# Patient Record
Sex: Male | Born: 1960 | Race: Black or African American | Hispanic: No | Marital: Married | Smoking: Former smoker
Health system: Southern US, Community
[De-identification: ages and names within clinical notes are randomized; demographics above are authoritative.]

## PROBLEM LIST (undated history)

## (undated) DIAGNOSIS — I10 Essential (primary) hypertension: Secondary | ICD-10-CM

## (undated) DIAGNOSIS — E785 Hyperlipidemia, unspecified: Secondary | ICD-10-CM

## (undated) DIAGNOSIS — K219 Gastro-esophageal reflux disease without esophagitis: Secondary | ICD-10-CM

## (undated) DIAGNOSIS — E119 Type 2 diabetes mellitus without complications: Secondary | ICD-10-CM

## (undated) DIAGNOSIS — K5790 Diverticulosis of intestine, part unspecified, without perforation or abscess without bleeding: Secondary | ICD-10-CM

## (undated) DIAGNOSIS — E1169 Type 2 diabetes mellitus with other specified complication: Secondary | ICD-10-CM

## (undated) DIAGNOSIS — K922 Gastrointestinal hemorrhage, unspecified: Secondary | ICD-10-CM

## (undated) DIAGNOSIS — Z87442 Personal history of urinary calculi: Secondary | ICD-10-CM

## (undated) DIAGNOSIS — I219 Acute myocardial infarction, unspecified: Secondary | ICD-10-CM

## (undated) DIAGNOSIS — K649 Unspecified hemorrhoids: Secondary | ICD-10-CM

## (undated) DIAGNOSIS — E7800 Pure hypercholesterolemia, unspecified: Secondary | ICD-10-CM

## (undated) DIAGNOSIS — F4310 Post-traumatic stress disorder, unspecified: Secondary | ICD-10-CM

## (undated) DIAGNOSIS — I2510 Atherosclerotic heart disease of native coronary artery without angina pectoris: Secondary | ICD-10-CM

## (undated) DIAGNOSIS — R011 Cardiac murmur, unspecified: Secondary | ICD-10-CM

## (undated) DIAGNOSIS — Z9289 Personal history of other medical treatment: Secondary | ICD-10-CM

## (undated) HISTORY — PX: COLON SURGERY: SHX602

## (undated) HISTORY — PX: JOINT REPLACEMENT: SHX530

## (undated) HISTORY — PX: INGUINAL HERNIA REPAIR: SUR1180

---

## 2009-04-02 DIAGNOSIS — I219 Acute myocardial infarction, unspecified: Secondary | ICD-10-CM

## 2009-04-02 HISTORY — PX: CORONARY ANGIOPLASTY WITH STENT PLACEMENT: SHX49

## 2009-04-02 HISTORY — DX: Acute myocardial infarction, unspecified: I21.9

## 2009-04-02 HISTORY — PX: COLON RESECTION: SHX5231

## 2009-07-22 ENCOUNTER — Ambulatory Visit: Payer: Self-pay | Admitting: Diagnostic Radiology

## 2009-07-22 ENCOUNTER — Emergency Department (HOSPITAL_BASED_OUTPATIENT_CLINIC_OR_DEPARTMENT_OTHER): Admission: EM | Admit: 2009-07-22 | Discharge: 2009-07-22 | Payer: Self-pay | Admitting: Pediatrics

## 2010-03-02 ENCOUNTER — Emergency Department (HOSPITAL_BASED_OUTPATIENT_CLINIC_OR_DEPARTMENT_OTHER): Admission: EM | Admit: 2010-03-02 | Discharge: 2010-03-02 | Payer: Self-pay | Admitting: Emergency Medicine

## 2010-03-06 ENCOUNTER — Emergency Department (HOSPITAL_BASED_OUTPATIENT_CLINIC_OR_DEPARTMENT_OTHER)
Admission: EM | Admit: 2010-03-06 | Discharge: 2010-03-06 | Payer: Self-pay | Source: Home / Self Care | Admitting: Emergency Medicine

## 2010-06-13 LAB — BASIC METABOLIC PANEL
CO2: 25 mEq/L (ref 19–32)
Chloride: 103 mEq/L (ref 96–112)
GFR calc Af Amer: >60 mL/min (ref >60)
Potassium: 4.6 mEq/L (ref 3.5–5.1)

## 2010-06-13 LAB — GLUCOSE, CAPILLARY

## 2010-06-20 LAB — URINALYSIS, ROUTINE W REFLEX MICROSCOPIC
Glucose, UA: NEGATIVE mg/dL
Leukocytes, UA: NEGATIVE
pH: 6.5 (ref 5.0–8.0)

## 2010-06-20 LAB — DIFFERENTIAL
Basophils Absolute: 0.4 10*3/uL — ABNORMAL HIGH (ref 0.0–0.1)
Eosinophils Relative: 1 % (ref 0–5)
Lymphocytes Relative: 19 % (ref 12–46)
Monocytes Absolute: 1 10*3/uL (ref 0.1–1.0)

## 2010-06-20 LAB — CBC
Platelets: 244 10*3/uL (ref 150–400)
WBC: 11.3 10*3/uL — ABNORMAL HIGH (ref 4.0–10.5)

## 2010-06-20 LAB — COMPREHENSIVE METABOLIC PANEL
AST: 18 U/L (ref 0–37)
Albumin: 4 g/dL (ref 3.5–5.2)
Alkaline Phosphatase: 75 U/L (ref 39–117)
Chloride: 104 mEq/L (ref 96–112)
Creatinine, Ser: 1 mg/dL (ref 0.4–1.5)
GFR calc Af Amer: >60 mL/min (ref >60)
Potassium: 4.1 mEq/L (ref 3.5–5.1)
Sodium: 142 mEq/L (ref 135–145)
Total Bilirubin: 0.4 mg/dL (ref 0.3–1.2)

## 2010-06-20 LAB — URINE MICROSCOPIC-ADD ON

## 2010-09-23 ENCOUNTER — Emergency Department (HOSPITAL_BASED_OUTPATIENT_CLINIC_OR_DEPARTMENT_OTHER)
Admission: EM | Admit: 2010-09-23 | Discharge: 2010-09-23 | Disposition: A | Discharge status: Other Acute Inpatient Hospital | Payer: Non-veteran care | Attending: Emergency Medicine | Admitting: Emergency Medicine

## 2010-09-23 ENCOUNTER — Emergency Department (INDEPENDENT_AMBULATORY_CARE_PROVIDER_SITE_OTHER): Payer: Non-veteran care

## 2010-09-23 DIAGNOSIS — I10 Essential (primary) hypertension: Secondary | ICD-10-CM | POA: Insufficient documentation

## 2010-09-23 DIAGNOSIS — E119 Type 2 diabetes mellitus without complications: Secondary | ICD-10-CM | POA: Insufficient documentation

## 2010-09-23 DIAGNOSIS — Z79899 Other long term (current) drug therapy: Secondary | ICD-10-CM | POA: Insufficient documentation

## 2010-09-23 DIAGNOSIS — R079 Chest pain, unspecified: Secondary | ICD-10-CM

## 2010-09-23 LAB — CBC
HCT: 37.2 % — ABNORMAL LOW (ref 39.0–52.0)
MCHC: 34.4 g/dL (ref 30.0–36.0)
RDW: 13.9 % (ref 11.5–15.5)

## 2010-09-23 LAB — COMPREHENSIVE METABOLIC PANEL
ALT: 18 U/L (ref 0–53)
Alkaline Phosphatase: 43 U/L (ref 39–117)
CO2: 27 mEq/L (ref 19–32)
Chloride: 96 mEq/L (ref 96–112)
GFR calc Af Amer: >60 mL/min (ref >60)
GFR calc non Af Amer: >60 mL/min (ref >60)
Glucose, Bld: 231 mg/dL — ABNORMAL HIGH (ref 70–99)
Potassium: 3.3 mEq/L — ABNORMAL LOW (ref 3.5–5.1)
Sodium: 135 mEq/L (ref 135–145)

## 2010-09-23 LAB — URINALYSIS, ROUTINE W REFLEX MICROSCOPIC
Bilirubin Urine: NEGATIVE
Hgb urine dipstick: NEGATIVE
Ketones, ur: NEGATIVE mg/dL
Protein, ur: NEGATIVE mg/dL
Urobilinogen, UA: 1 mg/dL (ref 0.0–1.0)

## 2010-09-23 LAB — CK TOTAL AND CKMB (NOT AT ARMC): Total CK: 317 U/L — ABNORMAL HIGH (ref 7–232)

## 2012-10-15 ENCOUNTER — Encounter (HOSPITAL_COMMUNITY): Payer: Self-pay | Admitting: Family Medicine

## 2012-10-15 ENCOUNTER — Inpatient Hospital Stay (HOSPITAL_COMMUNITY)
Admission: EM | Admit: 2012-10-15 | Discharge: 2012-10-30 | DRG: 357 | Disposition: A | Discharge status: Home Health | Payer: Non-veteran care | Attending: Internal Medicine | Admitting: Internal Medicine

## 2012-10-15 DIAGNOSIS — Z794 Long term (current) use of insulin: Secondary | ICD-10-CM

## 2012-10-15 DIAGNOSIS — K5731 Diverticulosis of large intestine without perforation or abscess with bleeding: Principal | ICD-10-CM | POA: Diagnosis present

## 2012-10-15 DIAGNOSIS — T50995A Adverse effect of other drugs, medicaments and biological substances, initial encounter: Secondary | ICD-10-CM | POA: Diagnosis present

## 2012-10-15 DIAGNOSIS — K5790 Diverticulosis of intestine, part unspecified, without perforation or abscess without bleeding: Secondary | ICD-10-CM | POA: Diagnosis present

## 2012-10-15 DIAGNOSIS — I10 Essential (primary) hypertension: Secondary | ICD-10-CM | POA: Diagnosis present

## 2012-10-15 DIAGNOSIS — Z9861 Coronary angioplasty status: Secondary | ICD-10-CM

## 2012-10-15 DIAGNOSIS — Z6832 Body mass index (BMI) 32.0-32.9, adult: Secondary | ICD-10-CM

## 2012-10-15 DIAGNOSIS — N4889 Other specified disorders of penis: Secondary | ICD-10-CM | POA: Diagnosis not present

## 2012-10-15 DIAGNOSIS — E669 Obesity, unspecified: Secondary | ICD-10-CM | POA: Diagnosis present

## 2012-10-15 DIAGNOSIS — D649 Anemia, unspecified: Secondary | ICD-10-CM

## 2012-10-15 DIAGNOSIS — I9589 Other hypotension: Secondary | ICD-10-CM

## 2012-10-15 DIAGNOSIS — I2510 Atherosclerotic heart disease of native coronary artery without angina pectoris: Secondary | ICD-10-CM | POA: Diagnosis present

## 2012-10-15 DIAGNOSIS — L02219 Cutaneous abscess of trunk, unspecified: Secondary | ICD-10-CM | POA: Diagnosis not present

## 2012-10-15 DIAGNOSIS — L02215 Cutaneous abscess of perineum: Secondary | ICD-10-CM | POA: Diagnosis present

## 2012-10-15 DIAGNOSIS — K922 Gastrointestinal hemorrhage, unspecified: Secondary | ICD-10-CM | POA: Diagnosis present

## 2012-10-15 DIAGNOSIS — R000 Tachycardia, unspecified: Secondary | ICD-10-CM | POA: Diagnosis not present

## 2012-10-15 DIAGNOSIS — I959 Hypotension, unspecified: Secondary | ICD-10-CM | POA: Diagnosis present

## 2012-10-15 DIAGNOSIS — D72829 Elevated white blood cell count, unspecified: Secondary | ICD-10-CM | POA: Diagnosis not present

## 2012-10-15 DIAGNOSIS — K219 Gastro-esophageal reflux disease without esophagitis: Secondary | ICD-10-CM | POA: Diagnosis present

## 2012-10-15 DIAGNOSIS — L03319 Cellulitis of trunk, unspecified: Secondary | ICD-10-CM | POA: Diagnosis not present

## 2012-10-15 DIAGNOSIS — Z9049 Acquired absence of other specified parts of digestive tract: Secondary | ICD-10-CM

## 2012-10-15 DIAGNOSIS — D62 Acute posthemorrhagic anemia: Secondary | ICD-10-CM | POA: Diagnosis present

## 2012-10-15 DIAGNOSIS — K644 Residual hemorrhoidal skin tags: Secondary | ICD-10-CM | POA: Diagnosis present

## 2012-10-15 DIAGNOSIS — E876 Hypokalemia: Secondary | ICD-10-CM | POA: Diagnosis present

## 2012-10-15 DIAGNOSIS — E119 Type 2 diabetes mellitus without complications: Secondary | ICD-10-CM | POA: Diagnosis present

## 2012-10-15 DIAGNOSIS — Z79899 Other long term (current) drug therapy: Secondary | ICD-10-CM

## 2012-10-15 DIAGNOSIS — Z7982 Long term (current) use of aspirin: Secondary | ICD-10-CM

## 2012-10-15 HISTORY — DX: Unspecified hemorrhoids: K64.9

## 2012-10-15 HISTORY — DX: Essential (primary) hypertension: I10

## 2012-10-15 HISTORY — DX: Gastro-esophageal reflux disease without esophagitis: K21.9

## 2012-10-15 HISTORY — DX: Cardiac murmur, unspecified: R01.1

## 2012-10-15 HISTORY — DX: Diverticulosis of intestine, part unspecified, without perforation or abscess without bleeding: K57.90

## 2012-10-15 HISTORY — DX: Atherosclerotic heart disease of native coronary artery without angina pectoris: I25.10

## 2012-10-15 LAB — COMPREHENSIVE METABOLIC PANEL
ALT: 12 U/L (ref 0–53)
Albumin: 3 g/dL — ABNORMAL LOW (ref 3.5–5.2)
Alkaline Phosphatase: 34 U/L — ABNORMAL LOW (ref 39–117)
BUN: 13 mg/dL (ref 6–23)
Calcium: 8.2 mg/dL — ABNORMAL LOW (ref 8.4–10.5)
Potassium: 3.1 mEq/L — ABNORMAL LOW (ref 3.5–5.1)
Sodium: 139 mEq/L (ref 135–145)
Total Protein: 6.2 g/dL (ref 6.0–8.3)

## 2012-10-15 LAB — CBC
MCH: 29.2 pg (ref 26.0–34.0)
MCHC: 34.1 g/dL (ref 30.0–36.0)
RDW: 14.7 % (ref 11.5–15.5)

## 2012-10-15 LAB — PROTIME-INR: INR: 1.02 (ref 0.00–1.49)

## 2012-10-15 LAB — OCCULT BLOOD, POC DEVICE: Fecal Occult Bld: POSITIVE — AB

## 2012-10-15 MED ORDER — SODIUM CHLORIDE 0.9 % IV SOLN
250.0000 mL | INTRAVENOUS | Status: DC | PRN
  Administered 2012-10-18: 250 mL via INTRAVENOUS

## 2012-10-15 MED ORDER — SODIUM CHLORIDE 0.9 % IV SOLN
INTRAVENOUS | Status: DC
Start: 2012-10-15 — End: 2012-10-17
  Administered 2012-10-16: 50 mL/h via INTRAVENOUS
  Administered 2012-10-16: via INTRAVENOUS

## 2012-10-15 MED ORDER — POTASSIUM CHLORIDE 10 MEQ/100ML IV SOLN
10.0000 meq | INTRAVENOUS | Status: DC
  Administered 2012-10-16: 10 meq via INTRAVENOUS
  Filled 2012-10-15: qty 400

## 2012-10-15 MED ORDER — PANTOPRAZOLE SODIUM 40 MG IV SOLR
40.0000 mg | Freq: Every day | INTRAVENOUS | Status: DC
  Administered 2012-10-16 – 2012-10-19 (×5): 40 mg via INTRAVENOUS
  Filled 2012-10-15 (×7): qty 40

## 2012-10-15 MED ORDER — INSULIN GLARGINE 100 UNIT/ML ~~LOC~~ SOLN
25.0000 [IU] | Freq: Every day | SUBCUTANEOUS | Status: DC
  Administered 2012-10-16 – 2012-10-29 (×15): 25 [IU] via SUBCUTANEOUS
  Filled 2012-10-15 (×17): qty 0.25

## 2012-10-15 MED ORDER — INSULIN ASPART 100 UNIT/ML ~~LOC~~ SOLN
0.0000 [IU] | SUBCUTANEOUS | Status: DC
  Administered 2012-10-16: 2 [IU] via SUBCUTANEOUS
  Administered 2012-10-16: 3 [IU] via SUBCUTANEOUS
  Administered 2012-10-17: 5 [IU] via SUBCUTANEOUS
  Administered 2012-10-17 (×2): 3 [IU] via SUBCUTANEOUS
  Administered 2012-10-17: 2 [IU] via SUBCUTANEOUS
  Administered 2012-10-17 (×2): 5 [IU] via SUBCUTANEOUS
  Administered 2012-10-18: 8 [IU] via SUBCUTANEOUS
  Administered 2012-10-18: 3 [IU] via SUBCUTANEOUS
  Administered 2012-10-18: 2 [IU] via SUBCUTANEOUS
  Administered 2012-10-18 – 2012-10-19 (×3): 3 [IU] via SUBCUTANEOUS
  Administered 2012-10-19 (×2): 2 [IU] via SUBCUTANEOUS
  Administered 2012-10-19: 3 [IU] via SUBCUTANEOUS
  Administered 2012-10-19: 2 [IU] via SUBCUTANEOUS

## 2012-10-15 NOTE — ED Notes (Signed)
ED EKG given to Dr. Dan Humphreys. No old EKG found in the system.

## 2012-10-15 NOTE — H&P (Signed)
PULMONARY  / CRITICAL CARE MEDICINE  Name: Antonio [name] MRN: [id number] DOB: Jun 12, 1960    ADMISSION DATE:  10/15/2012 CONSULTATION DATE:  10/15/2012  REFERRING MD :  ED PRIMARY SERVICE: CCM  CHIEF COMPLAINT:  Lower GI bleed  BRIEF PATIENT DESCRIPTION: [name] with hx of prior intestinal removal for diverticulosis, hx hemorrhoids presents with acute large lower GI bleed. Hypotensive on admission, responded to fluids and blood administration.  SIGNIFICANT EVENTS / STUDIES:  7/16 >>> presentation, hypotensive, admission, tx 1u PRBC  LINES / TUBES:  CULTURES: none  ANTIBIOTICS: None  HISTORY OF PRESENT ILLNESS:  [name] with hx of hemorrhoids, also hx of prior bowel surgery who presents with 3 episodes of large volume grossly bloody bowel movements. Pt reports this afternoon noted red blood on toilet paper. Then had cramping, had BM after that which was more bloody. One final BM which was reportedly copious red blood, started to feel weak and diaphoretic so EMS was called. BP in 70s upon EMS arrival, and in 80s here initially. In ER was given 1 unit PRBC. Is not in any pain at this time. Has never had any hx of bleeding like this other than some slight bleeding with hemorrhoids in the past if he strained hard. Has hx of diverticulitis for which he had 12 inches of intestines removed 3 years ago at [facility]. Does not have regular GI doctor. Last PO intake was around 3pm. He had an episode of chest pain that lasted about 5 minutes earlier but this has not recurred. No shortness of breath. Did take BP meds this morning.     PAST MEDICAL HISTORY :  Past Medical History  Diagnosis Date  . Hemorrhoids   . Stented coronary artery   . Diabetes   . Hypertension    Past Surgical History  Procedure Laterality Date  . Intestinal removal      12 inches taken out in 2011 at [facility] for diverticulosis  . Hernia repair     Prior to Admission medications   Medication  Sig Start Date End Date Taking? Authorizing Provider  aspirin EC 81 MG tablet Take 81 mg by mouth daily.   Yes Historical Provider, MD  atenolol-chlorthalidone (TENORETIC) 50-25 MG per tablet Take 1 tablet by mouth daily.   Yes Historical Provider, MD  insulin glargine (LANTUS) 100 UNIT/ML injection Inject 51 Units into the skin at bedtime.   Yes Historical Provider, MD  lisinopril (PRINIVIL,ZESTRIL) 40 MG tablet Take 40 mg by mouth daily.   Yes Historical Provider, MD  metFORMIN (GLUCOPHAGE) 1000 MG tablet Take 1,000 mg by mouth 2 (two) times daily with a meal.   Yes Historical Provider, MD  pantoprazole (PROTONIX) 40 MG tablet Take 40 mg by mouth daily as needed. For heartburn   Yes Historical Provider, MD  prasugrel (EFFIENT) 10 MG TABS Take 10 mg by mouth daily.   Yes Historical Provider, MD   No Known Allergies  FAMILY HISTORY:  Family History  Problem Relation Age of Onset  . Heart attack      SOCIAL HISTORY:  has no tobacco, alcohol, and drug history on file.  REVIEW OF SYSTEMS:  Per HPI. Otherwise full ROS performed and was negative.  SUBJECTIVE: see HPI  VITAL SIGNS: Temp:  [98 F (36.7 C)-99.8 F (37.7 C)] 99.7 F (37.6 C) (07/16 2157) Pulse Rate:  [58-91] 91 (07/16 2145) Resp:  [12-25] 20 (07/16 2145) BP: (80-123)/(51-78) 110/73 mmHg (07/16 2145) SpO2:  [  98 %-100 %] 100 % (07/16 2040) HEMODYNAMICS:   VENTILATOR SETTINGS:   INTAKE / OUTPUT: Intake/Output     07/16 0701 - 07/17 0700   Blood 350   Total Intake 350   Net +350         PHYSICAL EXAMINATION: General:  NAD Neuro:  Grossly nonfocal. Speech normal. Alert and oriented. HEENT:  NCAT. PERRL. MMM. Cardiovascular:  RRR Lungs:  CTAB, NWOB Abdomen:  +BS, soft, nontender to palpation, no masses or organomegaly Musculoskeletal:  Extremities atraumatic Skin:  No rashes noted GU: external anus shows no gross blood or fissures. Normal rectal tone. No palpable masses in rectum. Small amount red blood  present on glove after withdrawing.  LABS:  Recent Labs Lab 10/15/12 1811 10/15/12 1833  HGB 10.6*  --   WBC 8.6  --   PLT 177  --   NA 139  --   K 3.1*  --   CL 104  --   CO2 23  --   GLUCOSE 175*  --   BUN 13  --   CREATININE 1.05  --   CALCIUM 8.2*  --   AST 12  --   ALT 12  --   ALKPHOS 34*  --   BILITOT 0.4  --   PROT 6.2  --   ALBUMIN 3.0*  --   TROPONINI  --  <0.30   No results found for this basename: GLUCAP,  in the last 168 hours  CXR: none  ASSESSMENT / PLAN:  PULMONARY A:No acute issues P:   Continue to monitor  CARDIOVASCULAR A: Hx hypertension, but hypotensive upon admission, currently normotensive Chest pain prior to admission Hx CAD s/p stent x3 P:  Hold home antihypertensives, aspirin, effient Monitor BP Does not need CVL at this time Repeat troponin x 2 Repeat EKG in AM IVF NS @ 125 cc/hr  RENAL A:  Hypokalemia P:   Trend BMET Replete K oral  GASTROINTESTINAL A:   Acute lower GI bleed Hx diverticulosis, s/p removal of 12in intestines per pt. CT abd/pelv 2011 showed diffuse diverticulosis of entire colon. P:   Consult GI, likely needs scope Consider abdominal imaging (no acute abdomen at this time, however) NPO except ice chips Protonix for SUP  HEMATOLOGIC A:  Acute lower GIB P:  Hgb stable at 10.6, getting 1 unit in ED Monitor post transfusion CBC Transfuse again if Hgb <7 or actively bleeding Check PTT (PT/INR normal)  INFECTIOUS A: Low grade temp 100.1 in ED P:   Trend CBC, monitor fever curve  ENDOCRINE A:  Hx IDDM   P:   Lantus at 1/2 of home dose for now while NPO SSI  NEUROLOGIC A:  No acute issues P:   Monitor mental status  TODAY'S SUMMARY: admit to ICU for close monitoring, trend CBC, GI consult  Levert Feinstein, MD Family Medicine PGY-2  Attending: I have seen and examined the patient with nurse practitioner/resident and agree with the note above.   Repeat H/H OK after 1 U PRBC; bleeding  seems to be slowing; agree with plans for scope in AM.    I have personally obtained a history, examined the patient, evaluated laboratory and imaging results, formulated the assessment and plan and placed orders. CRITICAL CARE: The patient is critically ill with multiple organ systems failure and requires high complexity decision making for assessment and support, frequent evaluation and titration of therapies, application of advanced monitoring technologies and extensive interpretation of multiple databases. Critical Care Time  devoted to patient care services described in this note is 45 minutes.   Fonnie Jarvis Pulmonary and Critical Care Medicine [facility] Pager: [phone]  10/15/2012, 10:05 PM

## 2012-10-15 NOTE — ED Notes (Signed)
Pt reports he had taken a church retreat to [place] and was returning the church [employer] to [employer].  Pt reports that he'd has some bleeding in his BM earlier in the day.  At the church pt used restroom and had large amount of frank red blood in toilet, pt states all liquid.  EMS reports that pt was diaphoretic upon arrival and seemed to have a syncopal episode lasting just a couple of seconds.  EMS reports they were initially unable to obtain BP and could not obtain radial pulses.  Bilateral AC 18 g IV's started and approximately NS given and BP then 73/44.  Pt alert and oriented.

## 2012-10-15 NOTE — ED Provider Notes (Signed)
History    CSN: [id number] Arrival date & time 10/15/12  1756  First MD Initiated Contact with Patient 10/15/12 1817     Chief Complaint  Patient presents with  . Rectal Bleeding  . Hypotension   (Consider location/radiation/quality/duration/timing/severity/associated sxs/prior Treatment) HPI Comments: Pt with hx of DM, HTN, and hx of diverticulitis with hemicolectomy reports being in his usual state of health when at noon, he began to have diffuse, crampy abdominal pain.  He went to the bathroom and noted BRBPR.  This occurred two more times.  He was last noted to be in church when he had abd cramps, BRBPR and became diaphoretic.  EMS was called and found him hypotensive.  He states he had transient chest discomfort at that time which was substernal and lasted minutes, resolving without intervention.  He denies having any fevers, dyspnea, productive cough, or vomiting.  Endorses nausea.  He states he has never had these sx previously.  Patient is a 52 y.o. male presenting with hematochezia.  Rectal Bleeding Associated symptoms: abdominal pain   Associated symptoms: no fever and no vomiting    No past medical history on file. No past surgical history on file. No family history on file. History  Substance Use Topics  . Smoking status: Not on file  . Smokeless tobacco: Not on file  . Alcohol Use: Not on file    Review of Systems  Constitutional: Positive for diaphoresis. Negative for fever, chills, appetite change and fatigue.  HENT: Negative.   Eyes: Negative.   Respiratory: Negative.   Cardiovascular: Positive for chest pain. Negative for palpitations.  Gastrointestinal: Positive for nausea, abdominal pain, diarrhea, blood in stool and hematochezia. Negative for vomiting, constipation, abdominal distention and rectal pain.  Endocrine: Negative.   Genitourinary: Negative for dysuria, frequency, hematuria, flank pain and difficulty urinating.  Musculoskeletal: Negative.    Skin: Negative.   Allergic/Immunologic: Negative.   Neurological: Negative.   Hematological: Does not bruise/bleed easily.  Psychiatric/Behavioral: Negative.   All other systems reviewed and are negative.    Allergies  Review of patient's allergies indicates no known allergies.  Home Medications   Current Outpatient Rx  Name  Route  Sig  Dispense  Refill  . insulin glargine (LANTUS) 100 UNIT/ML injection   Subcutaneous   Inject 51 Units into the skin at bedtime.          BP 80/59  Pulse 65  Resp 16  SpO2 100% Physical Exam  Nursing note and vitals reviewed. Constitutional: He is oriented to person, place, and time. He appears well-developed and well-nourished. No distress.  HENT:  Head: Normocephalic and atraumatic.  Eyes: EOM are normal. Pupils are equal, round, and reactive to light. No scleral icterus.  Neck: Normal range of motion. Neck supple.  Cardiovascular: Normal rate, regular rhythm, normal heart sounds and intact distal pulses.   Pulmonary/Chest: Effort normal and breath sounds normal.  Abdominal: Soft. Bowel sounds are normal. He exhibits no distension and no mass. There is generalized tenderness. There is no rebound, no guarding and negative Murphy's sign.  Old surgical scar noted  Genitourinary: Guaiac positive stool.  Musculoskeletal: Normal range of motion.  Neurological: He is alert and oriented to person, place, and time.  Skin: Skin is warm and dry. He is not diaphoretic.  Psychiatric: He has a normal mood and affect. His behavior is normal.    ED Course  Procedures (including critical care time) Labs Reviewed  CBC  COMPREHENSIVE METABOLIC PANEL  TYPE AND  SCREEN  PREPARE RBC (CROSSMATCH)   No results found. No diagnosis found.  EKG: normal EKG, normal sinus rhythm, 81bmp PR 164. QRS 82. [id number] PROLONGED QTc.  MDM  LOWER GIB with HYPOTENSION on PRASUGREL: IVF bolus x 2 with continued hypotension in setting of GIB and low Hb.  Will  transfuse one unit PRBCs.  Due to persistent soft BPs, will admit to critical care. CHEST PAIN - suspect demand-related d/t hypotension.  No STEMI on EKG.  Trop ok. PROLONGED QTC - will avoid Qt prolonging meds.  CRITICAL CARE Performed by: Ronna Polio, ANN   Total critical care time: 45 min  Critical care time was exclusive of separately billable procedures and treating other patients.  Critical care was necessary to treat or prevent imminent or life-threatening deterioration.  Critical care was time spent personally by me on the following activities: development of treatment plan with patient and/or surrogate as well as nursing, discussions with consultants, evaluation of patient's response to treatment, examination of patient, obtaining history from patient or surrogate, ordering and performing treatments and interventions, ordering and review of laboratory studies, ordering and review of radiographic studies, pulse oximetry and re-evaluation of patient's condition.   Ashby Dawes, MD 10/15/12 2245

## 2012-10-15 NOTE — Consult Note (Signed)
Date: 10/15/2012               Patient Name:  Antonio [name] MRN: [id number]  DOB: [birth year] Age / Sex: 52 y.o., male   PCP: Pcp Not In System         Medical Service: Internal Medicine Teaching Service         Attending Physician: Dr. Lupita Leash, MD    First Contact: Dr. Windell Hummingbird. MD Pager: [phone]  Second Contact: Dr. Janalyn Harder, MD Pager: [phone]       After Hours (After 5p/  First Contact Pager: [phone]  weekends / holidays): Second Contact Pager: [phone]   Chief Complaint: Bright red blood per rectum  History of Present Illness: Patient is a 52 yo african [place] male with a PMH of diabetes, CAD (stent placement in 2011), HTN, and diverticulitis which required partial removal of colon.  Patient and his family were traveling from [place] and stopped to use the restroom and he noticed a lot of blood in the toilet when he had a bowel movement.  He subsequently has 2 additional episodes of bloody diarrhea with abdominal pain and became diaphoretic.  His wife called EMS and they were not able to get a radial pulse on him at the scene.   He denies any fever, chills, N/V, or recent weight loss.  In the ED his initial BP was 80/59 and heart rate was 58.  He was given 1 unit of prbcs in the ED and fluids and his pressure improved.   Meds: Current Facility-Administered Medications  Medication Dose Route Frequency Provider Last Rate Last Dose  . 0.9 %  sodium chloride infusion  250 mL Intravenous PRN Latrelle Dodrill, MD      . 0.9 %  sodium chloride infusion   Intravenous Continuous Latrelle Dodrill, MD      . Melene Muller ON 10/16/2012] insulin aspart (novoLOG) injection 0-15 Units  0-15 Units Subcutaneous Q4H Latrelle Dodrill, MD      . insulin glargine (LANTUS) injection 25 Units  25 Units Subcutaneous QHS Latrelle Dodrill, MD      . pantoprazole (PROTONIX) injection 40 mg  40 mg Intravenous QHS Latrelle Dodrill, MD      . potassium chloride 10 mEq in 100 mL IVPB   10 mEq Intravenous Q1 Hr x 4 Latrelle Dodrill, MD        Allergies: Allergies as of 10/15/2012  . (No Known Allergies)   Past Medical History  Diagnosis Date  . Hemorrhoids   . Stented coronary artery   . Diabetes   . Hypertension   . Diverticulosis    Past Surgical History  Procedure Laterality Date  . Intestinal removal      12 inches taken out in 2011 at [facility] for diverticulosis  . Hernia repair     Family History  Problem Relation Age of Onset  . Heart attack     History   Social History  . Marital Status: Married    Spouse Name: N/A    Number of Children: N/A  . Years of Education: N/A   Occupational History  . Not on file.   Social History Main Topics  . Smoking status: Not on file  . Smokeless tobacco: Not on file  . Alcohol Use: Not on file  . Drug Use: Not on file  . Sexually Active: Not on file   Other Topics Concern  . Not on file  Social History Narrative  . No narrative on file    Review of Systems: Pertinent items are noted in HPI.  Physical Exam: Blood pressure 116/64, pulse 91, temperature 100.1 F (37.8 C), temperature source Oral, resp. rate 19, SpO2 100.00%. General: resting in bed in no obvious distress HEENT: PERRL, EOMI, no scleral icterus Cardiac: RRR, no rubs, murmurs or gallops Pulm: clear to auscultation bilaterally, moving normal volumes of air Abd: obese, soft, nontender, nondistended, BS present Ext: warm and well perfused, no pedal edema Neuro: alert and oriented X3, cranial nerves II-XII grossly intact  Lab results: Basic Metabolic Panel:  Recent Labs  54/09/81 1811  NA 139  K 3.1*  CL 104  CO2 23  GLUCOSE 175*  BUN 13  CREATININE 1.05  CALCIUM 8.2*   Liver Function Tests:  Recent Labs  10/15/12 1811  AST 12  ALT 12  ALKPHOS 34*  BILITOT 0.4  PROT 6.2  ALBUMIN 3.0*   CBC:  Recent Labs  10/15/12 1811  WBC 8.6  HGB 10.6*  HCT 31.1*  MCV 85.7  PLT 177   Cardiac  Enzymes:  Recent Labs  10/15/12 1833  TROPONINI <0.30   Coagulation:  Recent Labs  10/15/12 2030  LABPROT 13.2  INR 1.02   IOther results:  Date: 10/15/2012  EKG Time: 11:48 PM  Rate: 81  Rhythm: normal sinus rhythm  Axis: right axis deviation  Intervals:none  ST&T Change: borderline T abnormalities, lateral leads   Assessment & Plan by Problem: BRBPR; Pt had 3 visibly large "stream like"k blood today and then began having CP and diaphoresis and then called EMS. On arrival he was hypotensive and EMS arrival had difficulty finding a radial pulse. He was hypotensive in ED and FOBT +. Pt received IVF and 1U PRBCs but was still hypotensive. Initial trop was negative and EKG just showed prolonged QT. Pt had not had repeat bloody BM in ED or repeat CP. Pt with extensive cardiac disease having stents and on effient and ASA as outpt.   Discussion with Dr. Dan Humphreys, ED physician and consensus was made to transport pt to ICU for monitoring and possibly GI evaluation if continues to decompensate. Dr. Drue Second was informed of the patient's evaluation and above findings along with knowledge of transfer of the patient to the ICU.    Dispo: Disposition is deferred at this time, awaiting improvement of current medical problems. Anticipated discharge in approximately 3-4 day(s).   The patient does have a current PCP (Pcp Not In System) and does need an [facility] hospital follow-up appointment after discharge.  The patient does not have transportation limitations that hinder transportation to clinic appointments.  Signed: Boykin Peek, MD 10/15/2012, 11:48 PM

## 2012-10-16 ENCOUNTER — Inpatient Hospital Stay (HOSPITAL_COMMUNITY): Payer: Non-veteran care

## 2012-10-16 DIAGNOSIS — K922 Gastrointestinal hemorrhage, unspecified: Secondary | ICD-10-CM | POA: Diagnosis present

## 2012-10-16 DIAGNOSIS — D649 Anemia, unspecified: Secondary | ICD-10-CM

## 2012-10-16 DIAGNOSIS — K5790 Diverticulosis of intestine, part unspecified, without perforation or abscess without bleeding: Secondary | ICD-10-CM | POA: Diagnosis present

## 2012-10-16 DIAGNOSIS — D62 Acute posthemorrhagic anemia: Secondary | ICD-10-CM | POA: Diagnosis present

## 2012-10-16 DIAGNOSIS — K5730 Diverticulosis of large intestine without perforation or abscess without bleeding: Secondary | ICD-10-CM

## 2012-10-16 DIAGNOSIS — I2510 Atherosclerotic heart disease of native coronary artery without angina pectoris: Secondary | ICD-10-CM | POA: Diagnosis present

## 2012-10-16 LAB — CBC
Hemoglobin: 9.8 g/dL — ABNORMAL LOW (ref 13.0–17.0)
Hemoglobin: 9.7 g/dL — ABNORMAL LOW (ref 13.0–17.0)
MCH: 28.6 pg (ref 26.0–34.0)
MCHC: 33.8 g/dL (ref 30.0–36.0)
MCHC: 35.3 g/dL (ref 30.0–36.0)
MCHC: 34.6 g/dL (ref 30.0–36.0)
MCHC: 34.5 g/dL (ref 30.0–36.0)
Platelets: 166 10*3/uL (ref 150–400)
Platelets: 158 10*3/uL (ref 150–400)
RDW: 14.1 % (ref 11.5–15.5)
RDW: 14.5 % (ref 11.5–15.5)
RDW: 14.5 % (ref 11.5–15.5)

## 2012-10-16 LAB — BASIC METABOLIC PANEL
GFR calc Af Amer: >90 mL/min (ref >90)
GFR calc non Af Amer: >90 mL/min (ref >90)
Glucose, Bld: 116 mg/dL — ABNORMAL HIGH (ref 70–99)
Potassium: 3.3 mEq/L — ABNORMAL LOW (ref 3.5–5.1)
Sodium: 138 mEq/L (ref 135–145)

## 2012-10-16 LAB — TROPONIN I
Troponin I: <0.3 ng/mL (ref <0.30)
Troponin I: <0.3 ng/mL (ref <0.30)
Troponin I: <0.3 ng/mL (ref <0.30)

## 2012-10-16 LAB — MRSA PCR SCREENING: MRSA by PCR: NEGATIVE

## 2012-10-16 LAB — GLUCOSE, CAPILLARY
Glucose-Capillary: 117 mg/dL — ABNORMAL HIGH (ref 70–99)
Glucose-Capillary: 110 mg/dL — ABNORMAL HIGH (ref 70–99)
Glucose-Capillary: 138 mg/dL — ABNORMAL HIGH (ref 70–99)

## 2012-10-16 MED ORDER — POTASSIUM CHLORIDE CRYS ER 20 MEQ PO TBCR
40.0000 meq | EXTENDED_RELEASE_TABLET | Freq: Once | ORAL | Status: AC
  Administered 2012-10-16: 40 meq via ORAL
  Filled 2012-10-16: qty 2

## 2012-10-16 NOTE — Consult Note (Signed)
[facility] Consultation  Referring Provider:    Internal Medicine Service Primary Care Physician:  Pcp Not In System Primary Gastroenterologist:     none    Reason for Consultation:    GI bleed         HPI:   Antonio [name] is a 52 y.o. male with a a history DAD/ cardiac stent placement on Effient. He has a history bowel resection for diverticular disease but no history of GI bleeding. Now admitted with painless lower GI bleeding which began yesterday. Patient passed several bloody stools yesterday, subsequently began very weak, EMS call. Patient was found to be hypotensive when EMS arrived. He takes Effient with history of cardiac stents. He is on a baby ASA, no other NSAIDS. No chronic GI problems except occasional heartburn which he treats with prn PPI.     Past Medical History  Diagnosis Date  . Hemorrhoids   . Stented coronary artery   . Diabetes   . Hypertension   . Diverticulosis     Past Surgical History  Procedure Laterality Date  . Intestinal removal      12 inches taken out in 2011 at [facility] for diverticulosis  . Hernia repair      Family History  Problem Relation Age of Onset  . Heart attack     No colon cancer  History  Substance Use Topics  . Smoking status: Not on file  . Smokeless tobacco: Not on file  . Alcohol Use: Not on file    Prior to Admission medications   Medication Sig Start Date End Date Taking? Authorizing Provider  aspirin EC 81 MG tablet Take 81 mg by mouth daily.   Yes Historical Provider, MD  atenolol-chlorthalidone (TENORETIC) 50-25 MG per tablet Take 1 tablet by mouth daily.   Yes Historical Provider, MD  insulin glargine (LANTUS) 100 UNIT/ML injection Inject 51 Units into the skin at bedtime.   Yes Historical Provider, MD  lisinopril (PRINIVIL,ZESTRIL) 40 MG tablet Take 40 mg by mouth daily.   Yes Historical Provider, MD  metFORMIN (GLUCOPHAGE) 1000 MG tablet Take 1,000 mg by mouth 2 (two) times daily with a meal.    Yes Historical Provider, MD  pantoprazole (PROTONIX) 40 MG tablet Take 40 mg by mouth daily as needed. For heartburn   Yes Historical Provider, MD  prasugrel (EFFIENT) 10 MG TABS Take 10 mg by mouth daily.   Yes Historical Provider, MD    Current Facility-Administered Medications  Medication Dose Route Frequency Provider Last Rate Last Dose  . 0.9 %  sodium chloride infusion  250 mL Intravenous PRN Latrelle Dodrill, MD      . 0.9 %  sodium chloride infusion   Intravenous Continuous Latrelle Dodrill, MD 125 mL/hr at 10/16/12 0025    . insulin aspart (novoLOG) injection 0-15 Units  0-15 Units Subcutaneous Q4H Latrelle Dodrill, MD   2 Units at 10/16/12 0400  . insulin glargine (LANTUS) injection 25 Units  25 Units Subcutaneous QHS Latrelle Dodrill, MD   25 Units at 10/16/12 0029  . pantoprazole (PROTONIX) injection 40 mg  40 mg Intravenous QHS Latrelle Dodrill, MD   40 mg at 10/16/12 0026    Allergies as of 10/15/2012  . (No Known Allergies)   Review of Systems:    All systems reviewed and negative except where noted in HPI.   Physical Exam:  Vital signs in last 24 hours: Temp:  [98 F (36.7 C)-100.1 F (37.8 C)]  99 F (37.2 C) (07/17 0833) Pulse Rate:  [58-105] 75 (07/17 0700) Resp:  [12-27] 19 (07/17 0700) BP: (80-123)/(46-85) 106/62 mmHg (07/17 0700) SpO2:  [98 %-100 %] 100 % (07/17 0700) Last BM Date: 10/16/12 General:   Pleasant black male in NAD Head:  Normocephalic and atraumatic. Eyes:   No icterus.   Conjunctiva pink. Ears:  Normal auditory acuity. Neck:  Supple; no masses felt Lungs:  Respirations even and unlabored. Lungs clear to auscultation bilaterally.   No wheezes, crackles, or rhonchi.  Heart:  Regular rate and rhythm Abdomen:  Soft, nondistended, nontender. Normal bowel sounds. No appreciable masses or hepatomegaly.  Rectal:  A few external hemorrhoids. Maroon stool in vault.  Msk:  Symmetrical without gross deformities.  Extremities:  Without  edema. Neurologic:  Alert and  oriented x4;  grossly normal neurologically. Skin:  Intact without significant lesions or rashes. Cervical Nodes:  No significant cervical adenopathy. Psych:  Alert and cooperative. Normal affect.  LAB RESULTS:  Recent Labs  10/15/12 1811 10/16/12 0120  WBC 8.6 10.0  HGB 10.6* 9.8*  HCT 31.1* 29.0*  PLT 177 191   BMET  Recent Labs  10/15/12 1811  NA 139  K 3.1*  CL 104  CO2 23  GLUCOSE 175*  BUN 13  CREATININE 1.05  CALCIUM 8.2*   LFT  Recent Labs  10/15/12 1811  PROT 6.2  ALBUMIN 3.0*  AST 12  ALT 12  ALKPHOS 34*  BILITOT 0.4   PT/INR  Recent Labs  10/15/12 2030  LABPROT 13.2  INR 1.02    PREVIOUS ENDOSCOPIES:            colonoscopy, ? In [place] approximately 5 years ago per patient. Results unknown.    Impression / Plan:   1. Pleasant 52 year old male with acute painless GI bleeding associated with hypotension in setting of Effient and ASA. Though brisk upper GI bleed not excluded given hypotension, suspect lower source. BUN is normal. Last Effient dose yesterday. No bleeding in several hours. If he has recurrent bleeding we may need to obtain bleeding scan, otherwise colonoscopy is reasonable. Patient hemodynamically stable after fluid resusitation  2. Anemia of acute blood loss Baseline hgb 13.2, down to 9.8 at 1am. He has since received a second unit of blood.  3.  CAD. History of stents, on Effient.  4. GERD, occasional symptoms. Takes PPI as needed  5. History of bowel resection for diverticular disease (recurrent diverticulitis?)  Thanks   LOS: 1 day   Willette Cluster  10/16/2012, 9:15 AM  Chart was reviewed and patient was examined. X-rays and lab were reviewed.    I agree with management and plans.  Plan colonoscopy in 48 hours to allow effient to wash out of system, bleeding scan if bleeding rate increases and he becomes unstable.  Barbette Hair. Arlyce Dice, M.D., [facility] Cell [phone]  [phone]

## 2012-10-16 NOTE — Progress Notes (Signed)
PULMONARY  / CRITICAL CARE MEDICINE  Name: Antonio [name] MRN: [id number] DOB: 11-Mar-1961    ADMISSION DATE:  10/15/2012 CONSULTATION DATE:  10/15/2012  REFERRING MD :  ED PRIMARY SERVICE: CCM  CHIEF COMPLAINT:  Lower GI bleed  BRIEF PATIENT DESCRIPTION: [name] with hx of prior intestinal removal for diverticulosis, hx hemorrhoids presents with acute large lower GI bleed. Hypotensive on admission, responded to fluids and blood administration.  SIGNIFICANT EVENTS / STUDIES:  7/16 >>> presentation, hypotensive, admission, tx 1u PRBC 7/17 >>> another 1 unit after another bloody bowel movement  LINES / TUBES:  CULTURES: none  ANTIBIOTICS: None  SUBJECTIVE: got another 1u overnight  VITAL SIGNS: Temp:  [98 F (36.7 C)-100.1 F (37.8 C)] 99 F (37.2 C) (07/17 0833) Pulse Rate:  [58-105] 75 (07/17 0700) Resp:  [12-27] 19 (07/17 0700) BP: (80-123)/(46-85) 106/62 mmHg (07/17 0700) SpO2:  [98 %-100 %] 100 % (07/17 0700) HEMODYNAMICS:   VENTILATOR SETTINGS:   INTAKE / OUTPUT: Intake/Output     07/16 0701 - 07/17 0700 07/17 0701 - 07/18 0700   I.V. 822.9    Blood 837.5    IV Piggyback 100    Total Intake 1760.4     Net +1760.4          Stool Occurrence 4 x      PHYSICAL EXAMINATION: General:  NAD Neuro:  Grossly nonfocal. Speech normal. Alert and oriented. HEENT:  NCAT Cardiovascular:  RRR Lungs:  CTAB, NWOB Abdomen:  soft, nontender to palpation Musculoskeletal:  Extremities atraumatic, SCD's in place Skin:  No rashes noted  LABS:  Recent Labs Lab 10/15/12 1811 10/15/12 1833 10/15/12 2030 10/16/12 0120  HGB 10.6*  --   --  9.8*  WBC 8.6  --   --  10.0  PLT 177  --   --  191  NA 139  --   --   --   K 3.1*  --   --   --   CL 104  --   --   --   CO2 23  --   --   --   GLUCOSE 175*  --   --   --   BUN 13  --   --   --   CREATININE 1.05  --   --   --   CALCIUM 8.2*  --   --   --   AST 12  --   --   --   ALT 12  --   --   --   ALKPHOS 34*  --   --   --    BILITOT 0.4  --   --   --   PROT 6.2  --   --   --   ALBUMIN 3.0*  --   --   --   APTT  --   --   --  25  INR  --   --  1.02  --   TROPONINI  --  <0.30  --  <0.30    Recent Labs Lab 10/16/12 0006 10/16/12 0007 10/16/12 0352 10/16/12 0819  GLUCAP 170* 172* 129* 117*    CXR: none  ASSESSMENT / PLAN:  PULMONARY A:No acute issues P:   Continue to monitor  CARDIOVASCULAR A: Hx hypertension, but hypotensive upon admission, currently normotensive Chest pain prior to admission Hx CAD s/p stent x3, trop neg x 3 P:  Hold home antihypertensives, aspirin, effient Monitor BP Does not need CVL at this time  EKG reviewed - normal QTc, T wave inversion v4-v6 - OLD IVF NS @ 125 cc/hr - reduce Trop neg thus far, dc further  RENAL A:  Hypokalemia, prbc given at risk K rise P:   Trend BMET, await AM lab for k  Reduce fluids  GASTROINTESTINAL A:   Acute lower GI bleed Hx diverticulosis, s/p removal of 12in intestines per pt. CT abd/pelv 2011 showed diffuse diverticulosis of entire colon. P:   GI consulted NPO except ice chips-per GI Low threshold NGT Protonix for SUP Cbc , see heme  HEMATOLOGIC A:  Acute lower GIB s/p 2 units P:  Await post transfusion CBC, then consider q12h Transfuse again if Hgb <7 or actively bleeding coags normal effient dc  INFECTIOUS A: Low grade temp 100.1 in ED P:   Trend CBC, monitor fever curve pcxr evaluation  ENDOCRINE A:  Hx IDDM   P:   Lantus at 1/2 of home dose for now while NPO Low threshold dc lantus  SSI  NEUROLOGIC A:  No acute issues P:   Monitor mental status  TODAY'S SUMMARY: GI consult, transfer to SDU, back to IMTS, will S/O  Levert Feinstein, MD Family Medicine PGY-2   I have personally obtained a history, examined the patient, evaluated laboratory and imaging results, formulated the assessment and plan and placed orders.  Mcarthur Rossetti. Tyson Alias, MD, FACP Pgr: [phone] [facility]

## 2012-10-16 NOTE — Consult Note (Signed)
Mr. Antonio [name] was transferred to the MICU and PCCM Service before I could see him.  Management per PCCM while requiring MICU level care.

## 2012-10-16 NOTE — Progress Notes (Signed)
[facility] PCCM  Antonio [name] had another bloody bowel movement.  He is comfortable and HD stable.  Frequency of the bloody stools has decreased which is encouraging.  Will transfuse one unit again now.  Discussed with [facility], they are aware.  If worsening bleeding throughout the night, will order tagged RBC scan.  If stable until AM, then likely colonoscopy in AM.  Yolonda Kida PCCM Pager: [phone] Cell: [phone] If no response, call [phone]

## 2012-10-17 ENCOUNTER — Inpatient Hospital Stay (HOSPITAL_COMMUNITY): Payer: Non-veteran care

## 2012-10-17 LAB — CBC WITH DIFFERENTIAL/PLATELET
Basophils Absolute: 0 10*3/uL (ref 0.0–0.1)
Basophils Relative: 0 % (ref 0–1)
Eosinophils Relative: 0 % (ref 0–5)
HCT: 22.6 % — ABNORMAL LOW (ref 39.0–52.0)
Hemoglobin: 7.9 g/dL — ABNORMAL LOW (ref 13.0–17.0)
Lymphocytes Relative: 9 % — ABNORMAL LOW (ref 12–46)
MCHC: 35 g/dL (ref 30.0–36.0)
MCV: 84.3 fL (ref 78.0–100.0)
Monocytes Absolute: 1 10*3/uL (ref 0.1–1.0)
Monocytes Relative: 6 % (ref 3–12)
RDW: 14.6 % (ref 11.5–15.5)

## 2012-10-17 LAB — CBC
HCT: 21.3 % — ABNORMAL LOW (ref 39.0–52.0)
Hemoglobin: 7.6 g/dL — ABNORMAL LOW (ref 13.0–17.0)
MCH: 29.9 pg (ref 26.0–34.0)
MCHC: 35.7 g/dL (ref 30.0–36.0)
RDW: 14.2 % (ref 11.5–15.5)

## 2012-10-17 LAB — BASIC METABOLIC PANEL
BUN: 17 mg/dL (ref 6–23)
Calcium: 8.1 mg/dL — ABNORMAL LOW (ref 8.4–10.5)
Creatinine, Ser: 0.98 mg/dL (ref 0.50–1.35)
GFR calc Af Amer: >90 mL/min (ref >90)
GFR calc non Af Amer: >90 mL/min (ref >90)
Potassium: 3.9 mEq/L (ref 3.5–5.1)

## 2012-10-17 LAB — GLUCOSE, CAPILLARY: Glucose-Capillary: 154 mg/dL — ABNORMAL HIGH (ref 70–99)

## 2012-10-17 MED ORDER — PEG-KCL-NACL-NASULF-NA ASC-C 100 G PO SOLR
0.5000 | Freq: Once | ORAL | Status: AC
  Administered 2012-10-18: 50 g via ORAL

## 2012-10-17 MED ORDER — PEG-KCL-NACL-NASULF-NA ASC-C 100 G PO SOLR: 1.0000 | Freq: Once | ORAL | Status: DC

## 2012-10-17 MED ORDER — TECHNETIUM TC 99M-LABELED RED BLOOD CELLS IV KIT
25.0000 | PACK | Freq: Once | INTRAVENOUS | Status: AC | PRN
  Administered 2012-10-17: 27 via INTRAVENOUS

## 2012-10-17 MED ORDER — PEG-KCL-NACL-NASULF-NA ASC-C 100 G PO SOLR
0.5000 | Freq: Once | ORAL | Status: AC
  Administered 2012-10-17: 50 g via ORAL
  Filled 2012-10-17: qty 1

## 2012-10-17 MED ORDER — KCL IN DEXTROSE-NACL 20-5-0.45 MEQ/L-%-% IV SOLN
INTRAVENOUS | Status: DC
  Administered 2012-10-17: 13:00:00 via INTRAVENOUS
  Administered 2012-10-18: 1000 mL via INTRAVENOUS
  Administered 2012-10-19: 14:00:00 via INTRAVENOUS
  Filled 2012-10-17 (×6): qty 1000

## 2012-10-17 NOTE — Progress Notes (Addendum)
PULMONARY  / CRITICAL CARE MEDICINE  Name: Antonio [name] MRN: [id number] DOB: 07-Apr-1960    ADMISSION DATE:  10/15/2012 CONSULTATION DATE:  10/15/2012  REFERRING MD :  ED PRIMARY SERVICE: CCM  CHIEF COMPLAINT:  Lower GI bleed  BRIEF PATIENT DESCRIPTION: [name] with hx of prior intestinal removal for diverticulosis, hx hemorrhoids presents with acute large lower GI bleed. Hypotensive on admission, responded to fluids and blood administration.  SIGNIFICANT EVENTS / STUDIES:  7/16 >>> presentation, hypotensive, admission, tx 1u PRBC 7/17 >>> another 1 unit after another bloody bowel movement 7/18 >>> recurrent hematochezia,  7/18 >>> tagged RBC scan  LINES / TUBES:  CULTURES: none  ANTIBIOTICS: None  SUBJECTIVE: Mr. Naugle has had five bloody bowel movements since transfer to the floor; the most recent was a large volume bloody bowel movement around midnight  VITAL SIGNS: Temp:  [98.5 F (36.9 C)-99 F (37.2 C)] 98.6 F (37 C) (07/18 0320) Pulse Rate:  [66-116] 116 (07/17 2356) Resp:  [14-24] 18 (07/18 0320) BP: (90-140)/(58-90) 116/74 mmHg (07/18 0320) SpO2:  [99 %-100 %] 100 % (07/18 0320) Weight:  [93.8 kg (206 lb 12.7 oz)] 93.8 kg (206 lb 12.7 oz) (07/17 0900) HEMODYNAMICS:   VENTILATOR SETTINGS:   INTAKE / OUTPUT: Intake/Output     07/17 0701 - 07/18 0700   I.V. (mL/kg) 742.5 (7.9)   Total Intake(mL/kg) 742.5 (7.9)   Urine (mL/kg/hr) 425 (0.2)   Total Output 425   Net +317.5       Stool Occurrence 2 x     PHYSICAL EXAMINATION: General:  Comfortable in bed HEENT: NCAT, EOMi CV: Tachy, regular AB:  non distended Ex: acyanotic, no edema Derm: pale   LABS:  Recent Labs Lab 10/15/12 1811  10/15/12 2030 10/16/12 0120 10/16/12 1006 10/16/12 1007 10/16/12 1509 10/16/12 1722 10/16/12 2326 10/17/12 0210  HGB 10.6*  --   --  9.8* 9.7*  --   --  8.9* 7.9*  --   WBC 8.6  --   --  10.0 9.7  --   --  9.4 13.1*  --   PLT 177  --   --  191 155  --   --  166  158  --   NA 139  --   --   --  138  --   --   --   --  135  K 3.1*  --   --   --  3.3*  --   --   --   --  3.9  CL 104  --   --   --  106  --   --   --   --  103  CO2 23  --   --   --  24  --   --   --   --  20  GLUCOSE 175*  --   --   --  116*  --   --   --   --  235*  BUN 13  --   --   --  15  --   --   --   --  17  CREATININE 1.05  --   --   --  0.85  --   --   --   --  0.98  CALCIUM 8.2*  --   --   --  7.9*  --   --   --   --  8.1*  AST 12  --   --   --   --   --   --   --   --   --  ALT 12  --   --   --   --   --   --   --   --   --   ALKPHOS 34*  --   --   --   --   --   --   --   --   --   BILITOT 0.4  --   --   --   --   --   --   --   --   --   PROT 6.2  --   --   --   --   --   --   --   --   --   ALBUMIN 3.0*  --   --   --   --   --   --   --   --   --   APTT  --   --   --  25  --   --   --   --   --   --   INR  --   --  1.02  --   --   --   --   --   --   --   TROPONINI  --   < >  --  <0.30  --  <0.30 <0.30  --   --   --   < > = values in this interval not displayed.  Recent Labs Lab 10/16/12 1643 10/16/12 2048 10/16/12 2354 10/17/12 0036 10/17/12 0323  GLUCAP 100* 138* 210* 254* 202*    CXR: none  ASSESSMENT / PLAN:  PULMONARY A:No acute issues P:   Continue to monitor  CARDIOVASCULAR A: Tachycardia from GI bleed, normotensive Hx CAD s/p stent x3, trop neg x 3 P:  Hold home antihypertensives, aspirin, effient Monitor BP 1 U PRBC now EKG reviewed - normal QTc, T wave inversion v4-v6 - OLD IVF NS @ 50cc/hr Trop neg thus far, dc further  RENAL A:  Hypokalemia, resolved P:   Trend BMET,  Reduce fluids  GASTROINTESTINAL A:   Acute lower GI bleed > bleeding again 7/18 early AM Hx diverticulosis, s/p removal of 12in intestines per pt. CT abd/pelv 2011 showed diffuse diverticulosis of entire colon. P:   STAT tagged PRBC scan NPO except ice chips-per GI Protonix for SUP Cbc , see heme  HEMATOLOGIC A:  Acute lower GIB s/p 2 units P:  STAT 1 U  PRBC now Cbc q6h Transfuse again if Hgb <7 or actively bleeding coags normal effient dc  INFECTIOUS A: Low grade temp 100.1 in ED P:   Trend CBC, monitor fever curve pcxr evaluation  ENDOCRINE A:  Hx IDDM   P:   Lantus at 1/2 of home dose for now while NPO Low threshold dc lantus  SSI  NEUROLOGIC A:  No acute issues P:   Monitor mental status  TODAY'S SUMMARY: Unfortunately Mr. Sayas has bled again this evening and needs a STAT PRBC scan and transfusion of blood.  Will transfer back to ICU to facilitate transfusion and studies.  CC time 45 minutes.  Yolonda Kida PCCM Pager: [phone] Cell: [phone] If no response, call [phone]

## 2012-10-17 NOTE — Progress Notes (Signed)
Inpatient Diabetes Program Recommendations  AACE/ADA: New Consensus Statement on Inpatient Glycemic Control (2013)  Target Ranges:  Prepandial:   less than 140 mg/dL      Peak postprandial:   less than 180 mg/dL (1-2 hours)      Critically ill patients:  140 - 180 mg/dL  Hyperglycemia Pt takes lantus 100 units at home.  Pt ordered 25 units here. Intention documented in notes states to order 1/2 home dose, 50 units. Glucose values yesterday were controlled due to pt having take his home lantus before hospitalized. Please increase the lantus to 50 units (1/2 home dose) Or might want to consider using the Hyperglycemia Protocol phase 1 to start for the best method of control.   Thank you, Lenor Coffin, RN, CNS, Diabetes Coordinator [phone] you, Lenor Coffin, RN, CNS, Diabetes Coordinator [phone])

## 2012-10-17 NOTE — Progress Notes (Signed)
PULMONARY  / CRITICAL CARE MEDICINE  Name: Antonio [name] MRN: [id number] DOB: Oct 07, 1960    ADMISSION DATE:  10/15/2012 CONSULTATION DATE:  10/15/2012  REFERRING MD :  ED PRIMARY SERVICE: CCM  CHIEF COMPLAINT:  Lower GI bleed  BRIEF PATIENT DESCRIPTION: [name] with hx of prior intestinal removal for diverticulosis, hx hemorrhoids presents with acute large lower GI bleed. Hypotensive on admission, responded to fluids and blood administration.  SIGNIFICANT EVENTS / STUDIES:  7/16 >>> presentation, hypotensive, admission, tx 1u PRBC 7/17 >>> another 1 unit after another bloody bowel movement 7/18 >>> Rectal bleeding and mild hypotension. received 1 unit PRBC 7/18 >>> tagged RBC scan, resulted negative for acute lower GI bleed   LINES / TUBES: none  CULTURES: MRSA by PCR   ANTIBIOTICS: None  SUBJECTIVE: Mr. Chew has had multiple bloody bowel movements since admission on 7/16. Most recent episode was around 0200 on 7/18. He is resting comfortably this AM and is in no acute pain.   VITAL SIGNS: Temp:  [98.5 F (36.9 C)-100 F (37.8 C)] 98.7 F (37.1 C) (07/18 0751) Pulse Rate:  [66-116] 110 (07/18 0700) Resp:  [14-26] 20 (07/18 0700) BP: (100-140)/(58-90) 124/76 mmHg (07/18 0700) SpO2:  [99 %-100 %] 100 % (07/18 0700) Weight:  [206 lb 12.7 oz (93.8 kg)] 206 lb 12.7 oz (93.8 kg) (07/17 0900) HEMODYNAMICS:   VENTILATOR SETTINGS:   INTAKE / OUTPUT: Intake/Output     07/17 0701 - 07/18 0700 07/18 0701 - 07/19 0700   I.V. (mL/kg) 742.5 (7.9)    Blood 362.5    IV Piggyback     Total Intake(mL/kg) 1105 (11.8)    Urine (mL/kg/hr) 425 (0.2)    Total Output 425     Net +680          Stool Occurrence 2 x      PHYSICAL EXAMINATION: General:  Comfortable in bed, resting HEENT: NCAT, EOMi CV: Tachy, regular. No m/r/g LUNGS: CTA posteriorly, no increased respiratory effort, no wheezes AB:  non distended, non-tender, normo-active bowel sounds Ex: acyanotic, no edema Derm:  pale   LABS:  Recent Labs Lab 10/15/12 1811  10/15/12 2030 10/16/12 0120 10/16/12 1006 10/16/12 1007 10/16/12 1509 10/16/12 1722 10/16/12 2326 10/17/12 0210 10/17/12 0330  HGB 10.6*  --   --  9.8* 9.7*  --   --  8.9* 7.9*  --  7.9*  WBC 8.6  --   --  10.0 9.7  --   --  9.4 13.1*  --  16.7*  PLT 177  --   --  191 155  --   --  166 158  --  144*  NA 139  --   --   --  138  --   --   --   --  135  --   K 3.1*  --   --   --  3.3*  --   --   --   --  3.9  --   CL 104  --   --   --  106  --   --   --   --  103  --   CO2 23  --   --   --  24  --   --   --   --  20  --   GLUCOSE 175*  --   --   --  116*  --   --   --   --  235*  --  BUN 13  --   --   --  15  --   --   --   --  17  --   CREATININE 1.05  --   --   --  0.85  --   --   --   --  0.98  --   CALCIUM 8.2*  --   --   --  7.9*  --   --   --   --  8.1*  --   AST 12  --   --   --   --   --   --   --   --   --   --   ALT 12  --   --   --   --   --   --   --   --   --   --   ALKPHOS 34*  --   --   --   --   --   --   --   --   --   --   BILITOT 0.4  --   --   --   --   --   --   --   --   --   --   PROT 6.2  --   --   --   --   --   --   --   --   --   --   ALBUMIN 3.0*  --   --   --   --   --   --   --   --   --   --   APTT  --   --   --  25  --   --   --   --   --   --   --   INR  --   --  1.02  --   --   --   --   --   --   --   --   TROPONINI  --   < >  --  <0.30  --  <0.30 <0.30  --   --   --   --   < > = values in this interval not displayed.  Recent Labs Lab 10/16/12 1643 10/16/12 2048 10/16/12 2354 10/17/12 0036 10/17/12 0323  GLUCAP 100* 138* 210* 254* 202*    CXR: none  ASSESSMENT / PLAN:  PULMONARY A:No acute issues P:   Continue to monitor  CARDIOVASCULAR A: Tachycardia from GI bleed, normotensive Hx CAD s/p stent x3, trop neg x 3 P:  Continue to hold home antihypertensives, aspirin, effient Monitor BP- stable this AM PRBC given overnight  IVF d5 half plus K at 75cc/hr Troponin negative    RENAL A:  Hypokalemia, resolved P:   Trend BMET.   BUN and Creatinine stable this AM at 17 and 0.98 respectively K at 3.9 this AM, will continue to follow. K added to IVF. Goal >4  GASTROINTESTINAL A:   Acute lower GI bleed > bleeding again 7/18 early AM Hx diverticulosis, s/p removal of 12in intestines per pt. CT abd/pelv 2011 showed diffuse diverticulosis of entire colon. P:   PRBC scan resulted in no acute GI bleed at 0630 this AM. Suggest colonoscopy this AM as patient is stable. (Per Dr. Kendrick Fries) NPO except ice chips-per GI. Advance diet if no colonoscopy this AM.  Protonix for SUP Cbc q 12   HEMATOLOGIC A:  Acute lower  GIB s/p 2 units P:  STAT 1 U PRBC given early AM Cbc q12h Transfuse again if Hgb <8 or actively bleeding coags normal effient dc  INFECTIOUS A: Low grade temp 100.1 in ED P:   Trend CBC, monitor fever curve. Temp stable since admission   ENDOCRINE A:  Hx IDDM   P:   Lantus at 1/2 of home dose for now while NPO Low threshold dc lantus  SSI  NEUROLOGIC A:  No acute issues P:   Monitor mental status  TODAY'S SUMMARY: Mr. Virnig needs further work up for acute lower GI bleed, possible colonoscopy this AM.  Change IVF to d5 half with K. Maintain hemodynamic stability.     Little, Brayton Mars, MD ; [facility] [phone].  After 5:30 PM or weekends, call [phone]   PCCM Pager: [phone] Cell: [phone] If no response, call [phone]

## 2012-10-17 NOTE — Progress Notes (Signed)
Responded to call from [facility] stating patient was tachycardic and hypotensive with recurrent GI bleed (BRBPR).  S: Patient resting comfortable in bed. NAD.   O:  Filed Vitals:   10/17/12 0121  BP: 102/59  Pulse:   Temp:   Resp: 18   General: resting comfortably, NAD. Heart: Tachycardic. No m/r/g Abdomen: soft, nontender, nondistended.   A/P:  CBC now. If Hb <7.0 will transfuse Called IR to get Tagged RBC scan; Scan ordered.  Radiology will be in contact with me to get this arranged for patient.

## 2012-10-17 NOTE — Progress Notes (Signed)
[facility] Progress Note   Subjective  feels okay   Objective   Vital signs in last 24 hours: Temp:  [98.5 F (36.9 C)-100 F (37.8 C)] 98.9 F (37.2 C) (07/18 1204) Pulse Rate:  [68-116] 113 (07/18 1200) Resp:  [15-27] 27 (07/18 1400) BP: (100-166)/(59-90) 127/74 mmHg (07/18 1400) SpO2:  [99 %-100 %] 100 % (07/18 1200) Last BM Date: 10/17/12 General:    Black male in NAD Abdomen:  Soft, nontender and nondistended. Normal bowel sounds. Extremities:  Without edema. Neurologic:  Alert and oriented,  grossly normal neurologically. Psych:  Cooperative. Normal mood and affect.   Lab Results:  Recent Labs  10/16/12 1722 10/16/12 2326 10/17/12 0330  WBC 9.4 13.1* 16.7*  HGB 8.9* 7.9* 7.9*  HCT 25.7* 22.9* 22.6*  PLT 166 158 144*   BMET  Recent Labs  10/15/12 1811 10/16/12 1006 10/17/12 0210  NA 139 138 135  K 3.1* 3.3* 3.9  CL 104 106 103  CO2 23 24 20  GLUCOSE 175* 116* 235*  BUN 13 15 17  CREATININE 1.05 0.85 0.98  CALCIUM 8.2* 7.9* 8.1*   LFT  Recent Labs  10/15/12 1811  PROT 6.2  ALBUMIN 3.0*  AST 12  ALT 12  ALKPHOS 34*  BILITOT 0.4   PT/INR  Recent Labs  10/15/12 2030  LABPROT 13.2  INR 1.02    Studies/Results: Nm Gi Blood Loss  10/17/2012   *RADIOLOGY REPORT*  Clinical Data: Symptomatic and recurrent lower GI bleed  NUCLEAR MEDICINE GASTROINTESTINAL BLEEDING SCAN  Technique:  Sequential abdominal images were obtained following intravenous administration of Tc-99m labeled red blood cells.  Radiopharmaceutical: 27MILLI CURIE ULTRATAG TECHNETIUM TC 99M- LABELED RED BLOOD CELLS IV KIT 27 mCi Tc-99m in-vitro labeled red cells.  Comparison:  CT abdomen pelvis - 07/22/2009  Findings:  Physiologic activity is seen within the heart and major blood vessels of the abdomen. Excreted radiotracer seen within the urinary bladder.  There is no definitive extravascular activity to suggest the presence of acute lower GI bleed.  IMPRESSION: No  scintigraphic evidence of acute lower GI bleed.   Original Report Authenticated By: John Watts V, MD   Dg Chest Port 1 View  10/16/2012   *RADIOLOGY REPORT*  Clinical Data: Cough.  PORTABLE CHEST - 1 VIEW  Comparison: PA and lateral chest 09/23/2010.  Findings: Mild elevation right hemidiaphragm relative to the left is again seen.  Lungs are clear.  Heart size is normal.  No pneumothorax or pleural effusion.  IMPRESSION: No acute disease.   Original Report Authenticated By: Thomas D'Alessio, M.D.     Assessment / Plan:   1. Acute painless GI bleeding associated with hypotension in setting of Effient and ASA. Though brisk upper GI bleed not excluded given hypotension, suspect lower source. BUN has remained normal. Last Effient dose morning prior to admission. Patient was transferred to floor yesterday where he had recurrent bleeding with hypotension. Tagged RBC scan done early this am and negative. Plan is for am colonoscopy, +/- EGD to rule out upper source.   2. Anemia of acute blood loss Baseline hgb 13.2, down to 8.9 yesterday afternoon. He got 2 units. Patient bleed again, hgb fell to 7.9. He has since gotten another unit of blood. .  3. CAD. History of stents, Effient.on hold   4. GERD, occasional symptoms. Takes PPI as needed   5. History of bowel resection for diverticular disease (recurrent diverticulitis  I spoke at length to family in room about possible causes   and treatment of bleeding    LOS: 2 days   Paula Guenther  10/17/2012, 3:13 PM  I have personally taken an interval history, reviewed the chart, and examined the patient.  I agree with the extender's note, impression and recommendations.   Sonoma Firkus D. Cullen Vanallen, MD, FACG [facility] [phone]     

## 2012-10-18 ENCOUNTER — Encounter (HOSPITAL_COMMUNITY)
Admission: EM | Disposition: A | Discharge status: Home Health | Payer: Self-pay | Source: Home / Self Care | Attending: Internal Medicine

## 2012-10-18 ENCOUNTER — Encounter (HOSPITAL_COMMUNITY): Payer: Self-pay

## 2012-10-18 DIAGNOSIS — K5731 Diverticulosis of large intestine without perforation or abscess with bleeding: Principal | ICD-10-CM

## 2012-10-18 HISTORY — PX: ESOPHAGOGASTRODUODENOSCOPY: SHX5428

## 2012-10-18 HISTORY — PX: COLONOSCOPY: SHX5424

## 2012-10-18 LAB — CBC
MCH: 29.7 pg (ref 26.0–34.0)
MCH: 29.2 pg (ref 26.0–34.0)
MCHC: 34.4 g/dL (ref 30.0–36.0)
MCV: 84.6 fL (ref 78.0–100.0)
Platelets: 160 10*3/uL (ref 150–400)
Platelets: 188 10*3/uL (ref 150–400)
RDW: 14.3 % (ref 11.5–15.5)
WBC: 10.4 10*3/uL (ref 4.0–10.5)

## 2012-10-18 LAB — BASIC METABOLIC PANEL
BUN: 8 mg/dL (ref 6–23)
CO2: 23 mEq/L (ref 19–32)
Calcium: 8.3 mg/dL — ABNORMAL LOW (ref 8.4–10.5)
Creatinine, Ser: 0.87 mg/dL (ref 0.50–1.35)
Glucose, Bld: 197 mg/dL — ABNORMAL HIGH (ref 70–99)

## 2012-10-18 LAB — GLUCOSE, CAPILLARY
Glucose-Capillary: 164 mg/dL — ABNORMAL HIGH (ref 70–99)
Glucose-Capillary: 185 mg/dL — ABNORMAL HIGH (ref 70–99)

## 2012-10-18 LAB — PREPARE RBC (CROSSMATCH)

## 2012-10-18 SURGERY — COLONOSCOPY
Anesthesia: Moderate Sedation

## 2012-10-18 MED ORDER — MIDAZOLAM HCL 5 MG/5ML IJ SOLN
INTRAMUSCULAR | Status: DC | PRN
  Administered 2012-10-18: 1 mg via INTRAVENOUS
  Administered 2012-10-18 (×2): 2 mg via INTRAVENOUS
  Administered 2012-10-18: 1 mg via INTRAVENOUS

## 2012-10-18 MED ORDER — POTASSIUM CHLORIDE CRYS ER 20 MEQ PO TBCR
40.0000 meq | EXTENDED_RELEASE_TABLET | Freq: Once | ORAL | Status: AC
  Administered 2012-10-18: 40 meq via ORAL
  Filled 2012-10-18: qty 2

## 2012-10-18 MED ORDER — MIDAZOLAM HCL 5 MG/ML IJ SOLN
INTRAMUSCULAR | Status: AC
  Filled 2012-10-18: qty 2

## 2012-10-18 MED ORDER — FENTANYL CITRATE 0.05 MG/ML IJ SOLN
INTRAMUSCULAR | Status: DC | PRN
  Administered 2012-10-18 (×4): 25 ug via INTRAVENOUS

## 2012-10-18 MED ORDER — POTASSIUM CHLORIDE 10 MEQ/100ML IV SOLN
10.0000 meq | INTRAVENOUS | Status: AC
  Administered 2012-10-18 (×2): 10 meq via INTRAVENOUS
  Filled 2012-10-18: qty 200

## 2012-10-18 MED ORDER — PRASUGREL HCL 10 MG PO TABS
10.0000 mg | ORAL_TABLET | Freq: Every day | ORAL | Status: DC
  Administered 2012-10-18: 10 mg via ORAL
  Filled 2012-10-18 (×2): qty 1

## 2012-10-18 MED ORDER — POTASSIUM CHLORIDE CRYS ER 20 MEQ PO TBCR: 40.0000 meq | EXTENDED_RELEASE_TABLET | Freq: Two times a day (BID) | ORAL | Status: DC

## 2012-10-18 MED ORDER — POLYETHYLENE GLYCOL 3350 17 GM/SCOOP PO POWD
1.0000 | Freq: Once | ORAL | Status: AC
  Administered 2012-10-18: 1 via ORAL
  Filled 2012-10-18: qty 255

## 2012-10-18 MED ORDER — DIPHENHYDRAMINE HCL 50 MG/ML IJ SOLN
INTRAMUSCULAR | Status: AC
  Filled 2012-10-18: qty 1

## 2012-10-18 MED ORDER — DIPHENHYDRAMINE HCL 50 MG/ML IJ SOLN
INTRAMUSCULAR | Status: DC | PRN
  Administered 2012-10-18: 25 mg via INTRAVENOUS

## 2012-10-18 MED ORDER — FENTANYL CITRATE 0.05 MG/ML IJ SOLN
INTRAMUSCULAR | Status: AC
  Filled 2012-10-18: qty 4

## 2012-10-18 NOTE — Interval H&P Note (Signed)
History and Physical Interval Note:  10/18/2012 4:18 PM  Antonio [name]  has presented today for surgery, with the diagnosis of hematochezia  The various methods of treatment have been discussed with the patient and family. After consideration of risks, benefits and other options for treatment, the patient has consented to  Procedure(s): COLONOSCOPY (N/A) ESOPHAGOGASTRODUODENOSCOPY (EGD) (N/A) as a surgical intervention .  The patient's history has been reviewed, patient examined, no change in status, stable for surgery.  I have reviewed the patient's chart and labs.  Questions were answered to the patient's satisfaction.     The recent H&P (dated *10/17/12**) was reviewed, the patient was examined and there is no change in the patients condition since that H&P was completed.   [name]  10/18/2012, 4:18 PM   [name]

## 2012-10-18 NOTE — Op Note (Signed)
[facility] [address] [place], [address]   COLONOSCOPY PROCEDURE REPORT  PATIENT: Bailen, Geffre  MR#: [id number] BIRTHDATE: [birth year] , 51  yrs. old GENDER: Male ENDOSCOPIST: Louis Meckel, MD REFERRED BY: PROCEDURE DATE:  10/18/2012 PROCEDURE:   Colonoscopy, diagnostic ASA CLASS:   Class II INDICATIONS:hematochezia. MEDICATIONS: These medications were titrated to patient response per physician's verbal order, Versed 5 mg IV, Fentanyl 75 mcg IV, and Benadryl 25 mg IV  DESCRIPTION OF PROCEDURE:   After the risks benefits and alternatives of the procedure were thoroughly explained, informed consent was obtained.  A digital rectal exam revealed no abnormalities of the rectum.   The [employer]ax Adult Colon [id number] endoscope was introduced through the anus and advanced to the cecum, which was identified by both the appendix and ileocecal valve. No adverse events experienced.   The quality of the prep was good, using MiraLax  The instrument was then slowly withdrawn as the colon was fully examined.      COLON FINDINGS: Moderate diverticulosis was noted in the descending colon.   Mild diverticulosis was noted in the transverse colon and ascending colon.   The colon mucosa was otherwise normal.  There was no fresh or old blood.  Retroflexed views revealed no abnormalities. The time to cecum=  .  Withdrawal time=5 minutes 0 seconds.  The scope was withdrawn and the procedure completed. COMPLICATIONS: There were no complications.  ENDOSCOPIC IMPRESSION: 1.   Moderate diverticulosis was noted in the descending colon 2.   Mild diverticulosis was noted in the transverse colon and ascending colon 3.   The colon mucosa was otherwise normal  acute GI bleed most likely secondary to diverticulosis  RECOMMENDATIONS: EGD to rule out  upper GI bleeding source  eSigned:  Louis Meckel, MD 10/18/2012 4:40 PM   cc:

## 2012-10-18 NOTE — Progress Notes (Signed)
PULMONARY  / CRITICAL CARE MEDICINE  Name: Antonio [name] MRN: [id number] DOB: April 09, 1960    ADMISSION DATE:  10/15/2012 CONSULTATION DATE:  10/15/2012  REFERRING MD :  ED PRIMARY SERVICE: CCM  CHIEF COMPLAINT:  Lower GI bleed  BRIEF PATIENT DESCRIPTION: [name] with hx of prior intestinal removal for diverticulosis, hx hemorrhoids presents with acute large lower GI bleed. Hypotensive on admission, responded to fluids and blood administration.  SIGNIFICANT EVENTS / STUDIES:  7/16 >>> presentation, hypotensive, admission, tx 1u PRBC 7/17 >>> another 1 unit after another bloody bowel movement 7/18 >>> Rectal bleeding and mild hypotension. received 1 unit PRBC 7/18 >>> tagged RBC scan, resulted negative for acute lower GI bleed   LINES / TUBES: none  CULTURES: MRSA by PCR   ANTIBIOTICS: None  SUBJECTIVE:  Feeling better.  Hungry.  1 colored BM this am post bowel prep  VITAL SIGNS: Temp:  [98.4 F (36.9 C)-98.9 F (37.2 C)] 98.8 F (37.1 C) (07/19 0745) Pulse Rate:  [95-113] 113 (07/18 1200) Resp:  [15-28] 28 (07/19 0700) BP: (105-166)/(57-92) 122/73 mmHg (07/19 0700) SpO2:  [98 %-100 %] 99 % (07/19 0500) Weight:  [209 lb 3.5 oz (94.9 kg)] 209 lb 3.5 oz (94.9 kg) (07/19 0500) HEMODYNAMICS:   VENTILATOR SETTINGS:   INTAKE / OUTPUT: Intake/Output     07/18 0701 - 07/19 0700 07/19 0701 - 07/20 0700   P.O. 840    I.V. (mL/kg) 1587.5 (16.7)    Blood     Total Intake(mL/kg) 2427.5 (25.6)    Urine (mL/kg/hr) 610 (0.3)    Total Output 610     Net +1817.5          Stool Occurrence 5 x      PHYSICAL EXAMINATION: General:  Comfortable in bed, resting HEENT: NCAT, EOMi CV: Tachy, regular. No m/r/g LUNGS: CTA posteriorly, no increased respiratory effort, no wheezes AB:  non distended, non-tender, normo-active bowel sounds Ex: acyanotic, no edema Derm: pale   LABS:  Recent Labs Lab 10/15/12 1811  10/15/12 2030 10/16/12 0120 10/16/12 1006 10/16/12 1007  10/16/12 1509  10/16/12 2326 10/17/12 0210 10/17/12 0330 10/17/12 2307  HGB 10.6*  --   --  9.8* 9.7*  --   --   < > 7.9*  --  7.9* 7.6*  WBC 8.6  --   --  10.0 9.7  --   --   < > 13.1*  --  16.7* 9.8  PLT 177  --   --  191 155  --   --   < > 158  --  144* 150  NA 139  --   --   --  138  --   --   --   --  135  --   --   K 3.1*  --   --   --  3.3*  --   --   --   --  3.9  --   --   CL 104  --   --   --  106  --   --   --   --  103  --   --   CO2 23  --   --   --  24  --   --   --   --  20  --   --   GLUCOSE 175*  --   --   --  116*  --   --   --   --  235*  --   --  BUN 13  --   --   --  15  --   --   --   --  17  --   --   CREATININE 1.05  --   --   --  0.85  --   --   --   --  0.98  --   --   CALCIUM 8.2*  --   --   --  7.9*  --   --   --   --  8.1*  --   --   AST 12  --   --   --   --   --   --   --   --   --   --   --   ALT 12  --   --   --   --   --   --   --   --   --   --   --   ALKPHOS 34*  --   --   --   --   --   --   --   --   --   --   --   BILITOT 0.4  --   --   --   --   --   --   --   --   --   --   --   PROT 6.2  --   --   --   --   --   --   --   --   --   --   --   ALBUMIN 3.0*  --   --   --   --   --   --   --   --   --   --   --   APTT  --   --   --  25  --   --   --   --   --   --   --   --   INR  --   --  1.02  --   --   --   --   --   --   --   --   --   TROPONINI  --   < >  --  <0.30  --  <0.30 <0.30  --   --   --   --   --   < > = values in this interval not displayed.  Recent Labs Lab 10/17/12 1538 10/17/12 2010 10/18/12 0013 10/18/12 0422 10/18/12 0735  GLUCAP 154* 216* 164* 124* 162*    CXR:  none   ASSESSMENT / PLAN:  PULMONARY A:No acute issues P:   -Continue to monitor  CARDIOVASCULAR A: Tachycardia from GI bleed, normotensive Hx CAD s/p stent x3, trop neg x 3 P:  -Continue to hold home antihypertensives, aspirin, effient -Monitor BP- stable this AM -cardiology/GI will need to determine when/if to restart asa/effient  RENAL A:   Hypokalemia, resolved P:   Trend BMET.   -K added to IVF. Goal >4  GASTROINTESTINAL A:   Acute lower GI bleed > bleeding again 7/18 early AM Hx diverticulosis - s/p removal of 12in intestines per pt. CT abd/pelv 2011 showed diffuse diverticulosis of entire colon. P:   -colonoscopy +/- EGD scheduled for this am  -NPO  -protonix  -GI following   HEMATOLOGIC A:  Acute lower GIB s/p 2 units P:  -trend CBC  -asa, effient held  -Transfuse  again if Hgb <8 or actively bleeding   INFECTIOUS A: Low grade temp 100.1 in ED P:   -Trend CBC, monitor fever curve. -Temp stable since admission   ENDOCRINE A:  Hx IDDM   P:   -Cont Lantus at 1/2 of home dose for now while NPO -SSI  NEUROLOGIC A:  No acute issues P:   -Monitor mental status  No further bleeding.  BP stable.  Awaiting colonoscopy/EGD.  Monitor CBC.   Will tx to SDU and ask [facility] to assume care in am 7/20.   Danford Bad, NP 10/18/2012  8:36 AM Pager: [phone] or [phone]  *Care during the described time interval was provided by me and/or other providers on the critical care team. I have reviewed this patient's available data, including medical history, events of note, physical examination and test results as part of my evaluation.  Transfer to SDU, TRH to pick up on 7/20, PCCM will sign off, please call back if needed.  Patient seen and examined, agree with above note.  I dictated the care and orders written for this patient under my direction.  Alyson Reedy, MD [phone]

## 2012-10-18 NOTE — H&P (View-Only) (Signed)
[facility] Progress Note   Subjective  feels okay   Objective   Vital signs in last 24 hours: Temp:  [98.5 F (36.9 C)-100 F (37.8 C)] 98.9 F (37.2 C) (07/18 1204) Pulse Rate:  [68-116] 113 (07/18 1200) Resp:  [15-27] 27 (07/18 1400) BP: (100-166)/(59-90) 127/74 mmHg (07/18 1400) SpO2:  [99 %-100 %] 100 % (07/18 1200) Last BM Date: 10/17/12 General:    Black male in NAD Abdomen:  Soft, nontender and nondistended. Normal bowel sounds. Extremities:  Without edema. Neurologic:  Alert and oriented,  grossly normal neurologically. Psych:  Cooperative. Normal mood and affect.   Lab Results:  Recent Labs  10/16/12 1722 10/16/12 2326 10/17/12 0330  WBC 9.4 13.1* 16.7*  HGB 8.9* 7.9* 7.9*  HCT 25.7* 22.9* 22.6*  PLT 166 158 144*   BMET  Recent Labs  10/15/12 1811 10/16/12 1006 10/17/12 0210  NA 139 138 135  K 3.1* 3.3* 3.9  CL 104 106 103  CO2 23 24 20   GLUCOSE 175* 116* 235*  BUN 13 15 17   CREATININE 1.05 0.85 0.98  CALCIUM 8.2* 7.9* 8.1*   LFT  Recent Labs  10/15/12 1811  PROT 6.2  ALBUMIN 3.0*  AST 12  ALT 12  ALKPHOS 34*  BILITOT 0.4   PT/INR  Recent Labs  10/15/12 2030  LABPROT 13.2  INR 1.02    Studies/Results: Nm Gi Blood Loss  10/17/2012   *RADIOLOGY REPORT*  Clinical Data: Symptomatic and recurrent lower GI bleed  NUCLEAR MEDICINE GASTROINTESTINAL BLEEDING SCAN  Technique:  Sequential abdominal images were obtained following intravenous administration of Tc-32m labeled red blood cells.  Radiopharmaceutical: CURIE ULTRATAG TECHNETIUM TC 77M- LABELED RED BLOOD CELLS IV KIT 27 mCi Tc-69m in-vitro labeled red cells.  Comparison:  CT abdomen pelvis - 07/22/2009  Findings:  Physiologic activity is seen within the heart and major blood vessels of the abdomen. Excreted radiotracer seen within the urinary bladder.  There is no definitive extravascular activity to suggest the presence of acute lower GI bleed.  IMPRESSION: No  scintigraphic evidence of acute lower GI bleed.   Original Report Authenticated By: Tacey Ruiz, MD   Dg Chest Port 1 View  10/16/2012   *RADIOLOGY REPORT*  Clinical Data: Cough.  PORTABLE CHEST - 1 VIEW  Comparison: PA and lateral chest 09/23/2010.  Findings: Mild elevation right hemidiaphragm relative to the left is again seen.  Lungs are clear.  Heart size is normal.  No pneumothorax or pleural effusion.  IMPRESSION: No acute disease.   Original Report Authenticated By: Holley Dexter, M.D.     Assessment / Plan:   1. Acute painless GI bleeding associated with hypotension in setting of Effient and ASA. Though brisk upper GI bleed not excluded given hypotension, suspect lower source. BUN has remained normal. Last Effient dose morning prior to admission. Patient was transferred to floor yesterday where he had recurrent bleeding with hypotension. Tagged RBC scan done early this am and negative. Plan is for am colonoscopy, +/- EGD to rule out upper source.   2. Anemia of acute blood loss Baseline hgb 13.2, down to 8.9 yesterday afternoon. He got 2 units. Patient bleed again, hgb fell to 7.9. He has since gotten another unit of blood. .  3. CAD. History of stents, Effient.on hold   4. GERD, occasional symptoms. Takes PPI as needed   5. History of bowel resection for diverticular disease (recurrent diverticulitis  I spoke at length to family in room about possible causes  and treatment of bleeding    LOS: 2 days   Willette Cluster  10/17/2012, 3:13 PM  I have personally taken an interval history, reviewed the chart, and examined the patient.  I agree with the extender's note, impression and recommendations.   Barbette Hair. Arlyce Dice, MD, [facility] [phone]

## 2012-10-18 NOTE — Progress Notes (Signed)
eLink Physician-Brief Progress Note Patient Name: Antonio [name] DOB: 1960/06/05 MRN: [id number]  Date of Service  10/18/2012   HPI/Events of Note  Patient underwent colonoscopy today.  Hgb this AM 7.3 now 6.6.  Now active bleeding noted by nurse   eICU Interventions  Plan: Transfuse 1 unit pRBC Post-transfusion CBC   Intervention Category Intermediate Interventions: Bleeding - evaluation and treatment with blood products  DETERDING,ELIZABETH 10/18/2012, 11:31 PM

## 2012-10-18 NOTE — Op Note (Signed)
[facility] [address] [place], [address]   ENDOSCOPY PROCEDURE REPORT  PATIENT: Antonio [name], Antonio [name]  MR#: [id number] BIRTHDATE: 11/14/1960 , 51  yrs. old GENDER: Male ENDOSCOPIST: Louis Meckel, MD REFERRED BY: PROCEDURE DATE:  10/18/2012 PROCEDURE:  EGD, diagnostic ASA CLASS:     Class II INDICATIONS:  Hematochezia.   Melena. MEDICATIONS: There was residual sedation effect present from prior procedure, These medications were titrated to patient response per physician's verbal order, Versed 1mg  IV, and Fentanyl 25 mcg IV TOPICAL ANESTHETIC: Cetacaine Spray  DESCRIPTION OF PROCEDURE: After the risks benefits and alternatives of the procedure were thoroughly explained, informed consent was obtained.  The Pentax Gastroscope [id number] endoscope was introduced through the mouth and advanced to the third portion of the duodenum. Without limitations.  The instrument was slowly withdrawn as the mucosa was fully examined.      The upper, middle and distal third of the esophagus were carefully inspected and no abnormalities were noted.  The z-line was well seen at the GEJ.  The endoscope was pushed into the fundus which was normal including a retroflexed view.  The antrum, gastric body, first and second part of the duodenum were unremarkable. Retroflexed views revealed no abnormalities.     The scope was then withdrawn from the patient and the procedure completed.  COMPLICATIONS: There were no complications. ENDOSCOPIC IMPRESSION: Normal EGD  RECOMMENDATIONS: 1.  may resume effient 2.  may resume effient  REPEAT EXAM:  eSigned:  Louis Meckel, MD 2012-10-18 19:14:[id number]   CC:

## 2012-10-18 NOTE — Progress Notes (Signed)
Colonoscopy demonstrated diffuse diverticulosis. There was no fresh or old blood. Upper endoscopy was negative.  The patient very likely bled from a diverticulum. At this point I think effient can be restarted.  Will order.

## 2012-10-19 DIAGNOSIS — E119 Type 2 diabetes mellitus without complications: Secondary | ICD-10-CM | POA: Diagnosis present

## 2012-10-19 DIAGNOSIS — D62 Acute posthemorrhagic anemia: Secondary | ICD-10-CM

## 2012-10-19 LAB — CBC
HCT: 22.6 % — ABNORMAL LOW (ref 39.0–52.0)
Hemoglobin: 7.9 g/dL — ABNORMAL LOW (ref 13.0–17.0)
MCH: 30 pg (ref 26.0–34.0)
MCHC: 35 g/dL (ref 30.0–36.0)
RBC: 2.63 MIL/uL — ABNORMAL LOW (ref 4.22–5.81)

## 2012-10-19 LAB — GLUCOSE, CAPILLARY
Glucose-Capillary: 122 mg/dL — ABNORMAL HIGH (ref 70–99)
Glucose-Capillary: 129 mg/dL — ABNORMAL HIGH (ref 70–99)
Glucose-Capillary: 148 mg/dL — ABNORMAL HIGH (ref 70–99)
Glucose-Capillary: 151 mg/dL — ABNORMAL HIGH (ref 70–99)
Glucose-Capillary: 166 mg/dL — ABNORMAL HIGH (ref 70–99)

## 2012-10-19 LAB — BASIC METABOLIC PANEL
BUN: 6 mg/dL (ref 6–23)
CO2: 23 mEq/L (ref 19–32)
Calcium: 8 mg/dL — ABNORMAL LOW (ref 8.4–10.5)
GFR calc non Af Amer: >90 mL/min (ref >90)
Glucose, Bld: 139 mg/dL — ABNORMAL HIGH (ref 70–99)
Sodium: 138 mEq/L (ref 135–145)

## 2012-10-19 LAB — TYPE AND SCREEN
ABO/RH(D): O POS
Unit division: 0
Unit division: 0

## 2012-10-19 LAB — CBC WITH DIFFERENTIAL/PLATELET
Hemoglobin: 7.6 g/dL — ABNORMAL LOW (ref 13.0–17.0)
Lymphocytes Relative: 30 % (ref 12–46)
Lymphs Abs: 2.4 10*3/uL (ref 0.7–4.0)
MCH: 29.9 pg (ref 26.0–34.0)
Monocytes Relative: 11 % (ref 3–12)
Neutro Abs: 4.7 10*3/uL (ref 1.7–7.7)
Neutrophils Relative %: 59 % (ref 43–77)
Platelets: 163 10*3/uL (ref 150–400)
RBC: 2.54 MIL/uL — ABNORMAL LOW (ref 4.22–5.81)
WBC: 8 10*3/uL (ref 4.0–10.5)

## 2012-10-19 MED ORDER — INSULIN ASPART 100 UNIT/ML ~~LOC~~ SOLN
0.0000 [IU] | Freq: Three times a day (TID) | SUBCUTANEOUS | Status: DC
  Administered 2012-10-20: 2 [IU] via SUBCUTANEOUS
  Administered 2012-10-20: 3 [IU] via SUBCUTANEOUS
  Administered 2012-10-20: 2 [IU] via SUBCUTANEOUS

## 2012-10-19 NOTE — Progress Notes (Signed)
Subjective: Feeling better this morning. Transferred to stepdown yesterday. Received pRBC x1 last night though patient unclear why. Denies dizziness, lightheadedness, bloody vomiting/BM, pain. Has not had BM since colonoscopy yesterday.   Colonoscopy yesterday showed moderate diverticular disease in descending colon with minor disease in ascending and transverse colons. EGD was unremarkable for acute bleeding.  In reviewing history, patient has not noted bloody BM since bowel resection in 2011 until the ones prompting this admission. Unclear of what type of stents (thinks it might be bare metal) but done at [facility] and knows that "I will probably have to be on Effienz for the rest of my life." Also unsure of glucose control for DM2.  PCP is Dr. Deatra [facility], [place]).  Objective: Vital signs in last 24 hours: Filed Vitals:   10/19/12 0004 10/19/12 0104 10/19/12 0204 10/19/12 0431  BP: 126/73 132/85 112/75 126/80  Pulse: 88 101 98 92  Temp: 98.9 F (37.2 C) 98.8 F (37.1 C) 99.1 F (37.3 C) 98.7 F (37.1 C)  TempSrc: Oral Oral Oral Oral  Resp: 18 18 16 16   Height:      Weight:    93.441 kg (206 lb)  SpO2: 100% 100%  100%   Physical Exam General: alert, cooperative, lying comfortably in bed HEENT: pupils equal round and reactive to light, vision grossly intact, oropharynx clear and non-erythematous  Lungs: clear to ascultation bilaterally, normal work of respiration, no wheezes, rales, ronchi Heart: regular rate and rhythm, early systolic murmur grade II/VI at LUSB Abdomen: soft, non-tender, non-distended, normal bowel sounds Extremities: 2+ DP/PT pulses bilaterally, no cyanosis, clubbing, or edema Neurologic: alert & oriented X3, cranial nerves II-XII intact, strength grossly intact, sensation intact to light touch  Lab Results: BMET    Component Value Date/Time   NA 138 10/19/2012 0515   K 3.4* 10/19/2012 0515   CL 107 10/19/2012 0515   CO2 23 10/19/2012  0515   GLUCOSE 139* 10/19/2012 0515   BUN 6 10/19/2012 0515   CREATININE 0.91 10/19/2012 0515   CALCIUM 8.0* 10/19/2012 0515   GFRNONAA >90 10/19/2012 0515   GFRAA >90 10/19/2012 0515   CBC    Component Value Date/Time   WBC 8.0 10/19/2012 0515   RBC 2.54* 10/19/2012 0515   HGB 7.6* 10/19/2012 0515   HCT 22.1* 10/19/2012 0515   PLT 163 10/19/2012 0515   MCV 87.0 10/19/2012 0515   MCH 29.9 10/19/2012 0515   MCHC 34.4 10/19/2012 0515   RDW 14.4 10/19/2012 0515   LYMPHSABS 2.4 10/19/2012 0515   MONOABS 0.8 10/19/2012 0515   EOSABS 0.1 10/19/2012 0515   BASOSABS 0.0 10/19/2012 0515   Studies/Results:  Colonoscopy ENDOSCOPIC IMPRESSION:  1. Moderate diverticulosis was noted in the descending colon  2. Mild diverticulosis was noted in the transverse colon and ascending colon  3. The colon mucosa was otherwise normal acute GI bleed most likely secondary to diverticulosis  EGD ENDOSCOPIC IMPRESSION:  Normal EGD  Medications: I have reviewed the patient's current medications. Scheduled Meds: . insulin aspart  0-15 Units Subcutaneous Q4H  . insulin glargine  25 Units Subcutaneous QHS  . pantoprazole (PROTONIX) IV  40 mg Intravenous QHS   Continuous Infusions: . dextrose 5 % and 0.45 % NaCl with KCl 20 mEq/L 1,000 mL (10/18/12 1834)   PRN Meds:.sodium chloride  52 year old male w/ diverticulosis s/p bowel resection, anemia, CAD s/p stent placements, DM2 admitted to ICU for hypotension from acute lower GI bleed likely 2/2 diverticulosis now in  stepdown.   #Lower GI bleed 2/2 diverticulosis: Advised patient to monitor for bloody BM and symptoms. Continue pantoprazole for GI prophylaxis.  #Anemia: pRBCs x4 since admission with Hb now at 7.6 vs. 10.6 on admission. Continue following CBC.  #CAD s/p stent placements: Home meds include aspirin and prasugrel. Holding both in setting of acute GI bleed and will need to consider which one to restart at time of discharge.     #DM2: Currently taking  Lantus 25u & SSI but takes 51u Lantus at home. Glucose 140 today. Continue following glucose and titrate Lantus as needed.  #Disposition: Pending improvement in health but possibly in 2-3 days.   This is a Psychologist, occupational Note.  The care of the patient was discussed with Dr. Janalyn Harder and the assessment and plan formulated with their assistance.  Please see their attached note for official documentation of the daily encounter.   LOS: 4 days   Beather Arbour, Med Student 10/19/2012, 9:31 AM

## 2012-10-19 NOTE — Progress Notes (Signed)
  [facility] Progress Note   Subjective  **No bowel movements since procedures yesterday.*   Objective  Vital signs in last 24 hours: Temp:  [98.5 F (36.9 C)-99.1 F (37.3 C)] 98.7 F (37.1 C) (07/20 0431) Pulse Rate:  [66-109] 92 (07/20 0431) Resp:  [12-37] 16 (07/20 0431) BP: (112-150)/(67-97) 126/80 mmHg (07/20 0431) SpO2:  [100 %] 100 % (07/20 0431) Weight:  [206 lb (93.441 kg)] 206 lb (93.441 kg) (07/20 0431) Last BM Date: 10/18/12 General:   Alert,  Well-developed,  white male in NAD Heart:  Regular rate and rhythm; no murmurs Abdomen:  Soft, nontender and nondistended. Normal bowel sounds, without guarding, and without rebound.   Extremities:  Without edema. Neurologic:  Alert and  oriented x4;  grossly normal neurologically. Psych:  Alert and cooperative. Normal mood and affect.  Intake/Output from previous day: 07/19 0701 - 07/20 0700 In: 2367 [P.O.:120; I.V.:1767; Blood:280; IV Piggyback:200] Out: 300 [Urine:300] Intake/Output this shift: Total I/O In: 371.3 [P.O.:240; I.V.:131.3] Out: -   Lab Results:  Recent Labs  10/18/12 1021 10/18/12 2240 10/19/12 0515  WBC 10.4 11.4* 8.0  HGB 7.3* 6.6* 7.6*  HCT 20.8* 19.2* 22.1*  PLT 160 188 163   BMET  Recent Labs  10/17/12 0210 10/18/12 1021 10/19/12 0515  NA 135 139 138  K 3.9 3.2* 3.4*  CL 103 106 107  CO2 20 23 23   GLUCOSE 235* 197* 139*  BUN 17 8 6   CREATININE 0.98 0.87 0.91  CALCIUM 8.1* 8.3* 8.0*   LFT No results found for this basename: PROT, ALBUMIN, AST, ALT, ALKPHOS, BILITOT, BILIDIR, IBILI,  in the last 72 hours PT/INR No results found for this basename: LABPROT, INR,  in the last 72 hours Hepatitis Panel No results found for this basename: HEPBSAG, HCVAB, HEPAIGM, HEPBIGM,  in the last 72 hours  Studies/Results: No results found.    Assessment & Plan  *1.  Acute GI bleed-likely secondary to diverticular source, exacerbated by antiplatelet medication. Drop in  hemoglobin most likely secondary to recalibration.  Patient is not clinically bleeding. Okay to continue effient.  Begin iron supplementation. Would observe another 24 hours to make sure that hemoglobin remained stable. ** Principal Problem:   Acute lower GI bleeding Active Problems:   Diverticulosis   Anemia   CAD (coronary artery disease)   Diverticulosis of colon with hemorrhage   Diabetes mellitus, type 2     LOS: 4 days   Antonio [name]  10/19/2012, 10:09 AM

## 2012-10-19 NOTE — Progress Notes (Signed)
RN called Dr. Delane Ginger to clarify CBG orders. Patient ordered for Q4H CBG since 7/17, he has been on a regular diet for since 7/19, eating well with CBG's less than 200 for last 2 days. Orders given to continue Q4H CBG. Patient informed. Will continue to monitor and assist as needed. Antonio [name] [facility]

## 2012-10-19 NOTE — Progress Notes (Signed)
I have seen the patient and reviewed the excellent daily progress note by Heywood Iles MS4 and discussed the care of the patient with them.  See below for documentation of my findings, assessment, and plans.  Subjective: The patient was transferred from the ICU yesterday, and our service is taking over care today.  Briefly, this is a 53 yo man, history of CAD s/p stents (most recent in 2011), DM, HTN, prior diverticulosis with partial colectomy in 2011, presenting 7/16 with painless GI bleed and hypotension.  He was admitted to the ICU.  Over the course of his hospitalization, he received 4 total units of pRBC's, the most recent of which was last night due to a Hb of 6.6.  The patient noted bloody bowel movements during his hospitalization, the last of which was on the morning of 7/19.  Tagged RBC scan 7/18 showed no source of bleed, and EGD and colonoscopy on 7/19 showed only a large number of non-bleeding diverticula.  The patient notes no bowel movement since his colonoscopy.  This morning, the patient endorses mild lightheadedness, unchanged from yesterday.  He notes good PO intake with regular diet.  No blood per rectum.  Objective: Vital signs in last 24 hours: Filed Vitals:   10/19/12 0004 10/19/12 0104 10/19/12 0204 10/19/12 0431  BP: 126/73 132/85 112/75 126/80  Pulse: 88 101 98 92  Temp: 98.9 F (37.2 C) 98.8 F (37.1 C) 99.1 F (37.3 C) 98.7 F (37.1 C)  TempSrc: Oral Oral Oral Oral  Resp: 18 18 16 16   Height:      Weight:    206 lb (93.441 kg)  SpO2: 100% 100%  100%   Weight change: -3 lb 3.5 oz (-1.459 kg)  Intake/Output Summary (Last 24 hours) at 10/19/12 1339 Last data filed at 10/19/12 0900  Gross per 24 hour  Intake 2663.25 ml  Output    300 ml  Net 2363.25 ml  General: alert, cooperative, and in no apparent distress HEENT: pupils equal round and reactive to light, vision grossly intact, oropharynx clear and non-erythematous  Neck: supple, no  lymphadenopathy Lungs: clear to ascultation bilaterally, normal work of respiration, no wheezes, rales, ronchi Heart: regular rate and rhythm, grade II/VI early systolic murmur best heard at LUSB Abdomen: soft, non-tender, non-distended, normal bowel sounds Extremities: no cyanosis, clubbing, or edema Neurologic: alert & oriented X3, cranial nerves II-XII intact, strength grossly intact, sensation intact to light touch  Lab Results: Reviewed and documented in Electronic Record Micro Results: Reviewed and documented in Electronic Record Studies/Results: Reviewed and documented in Electronic Record Medications: I have reviewed the patient's current medications. Scheduled Meds: . insulin aspart  0-15 Units Subcutaneous Q4H  . insulin glargine  25 Units Subcutaneous QHS  . pantoprazole (PROTONIX) IV  40 mg Intravenous QHS   Continuous Infusions: . dextrose 5 % and 0.45 % NaCl with KCl 20 mEq/L 1,000 mL (10/18/12 1834)   PRN Meds:.sodium chloride Assessment/Plan: The patient is a 52 yo man, history of CAD, HTN, prior diverticulosis s/p partial colectomy, presenting with a diverticular bleed.  # Acute blood loss anemia/Diverticulosis - The patient presented with painless blood per rectum, likely representing diverticular bleed.  Tagged RBC scan negative, and colonoscopy only revealed diverticulosis without bleeding.  Hb dropped last night, but patient notes no blood per rectum since before his colonoscopy 7/19. -continue to follow CBC daily, will also check this evening -monitor for more BRBPR -will hold off on effient or aspirin for now (see CAD below) -  GI following, greatly appreciate recs -continue protonix -d/c IV fluids today, as patient is better tolerating PO  # CAD - The patient has a history of CAD, with 3 prior stents, most recently in 2011 (pt unsure of BMS vs DES), at [facility].  Patient was informed to continued aspirin and effient for life.  Per guideline recs, the  patient has been on dual antiplatelet for the required 1 year after stent.  Given history of at least 2 prior GI bleeds, one requiring partial colectomy, I believe the risk of bleeding outweigh the benefits of dual antiplatelet therapy at this time. -hold aspirin and effient until Hb stabilizes -recommend restarting aspirin alone at discharge; pt to follow-up with his cardiologist for further management thereafter  # DM - history of DM, diagnosed in 2004.  Pt takes home Lantus 51 units daily.  CBG's have been well-controlled this hospitalization -continue Lantus 25, with moderate SSI -uptitrate Lantus towards home dose as PO intake improves  # HTN - History of HTN, on home lisinopril, atneolol, chlorthalidone -holding antihypertensives in the setting of acute blood loss anemia.  Can re-add as tolerated  Dispo: Disposition is deferred at this time, awaiting improvement of current medical problems.  Anticipated discharge in approximately 1-3 day(s).   The patient does have a current PCP (Dr. Hazle Nordmann, [facility]) and does not need an [facility] hospital follow-up appointment after discharge.  .Services Needed at time of discharge: Y = Yes, Blank = No PT:   OT:   RN:   Equipment:   Other:     LOS: 4 days   Linward Headland, MD 10/19/2012, 1:39 PM

## 2012-10-20 ENCOUNTER — Encounter (HOSPITAL_COMMUNITY): Payer: Self-pay | Admitting: Gastroenterology

## 2012-10-20 LAB — CBC
HCT: 22.2 % — ABNORMAL LOW (ref 39.0–52.0)
MCHC: 34.2 g/dL (ref 30.0–36.0)
MCV: 86.3 fL (ref 78.0–100.0)
Platelets: 205 10*3/uL (ref 150–400)
Platelets: 252 10*3/uL (ref 150–400)
RBC: 2.63 MIL/uL — ABNORMAL LOW (ref 4.22–5.81)
RDW: 14.5 % (ref 11.5–15.5)
WBC: 8.4 10*3/uL (ref 4.0–10.5)
WBC: 10.6 10*3/uL — ABNORMAL HIGH (ref 4.0–10.5)

## 2012-10-20 LAB — GLUCOSE, CAPILLARY
Glucose-Capillary: 142 mg/dL — ABNORMAL HIGH (ref 70–99)
Glucose-Capillary: 155 mg/dL — ABNORMAL HIGH (ref 70–99)

## 2012-10-20 LAB — HEMOGLOBIN AND HEMATOCRIT, BLOOD
HCT: 21.6 % — ABNORMAL LOW (ref 39.0–52.0)
Hemoglobin: 7.4 g/dL — ABNORMAL LOW (ref 13.0–17.0)

## 2012-10-20 MED ORDER — PANTOPRAZOLE SODIUM 40 MG PO TBEC
40.0000 mg | DELAYED_RELEASE_TABLET | Freq: Every day | ORAL | Status: DC
  Administered 2012-10-20 – 2012-10-29 (×10): 40 mg via ORAL
  Filled 2012-10-20 (×10): qty 1

## 2012-10-20 MED ORDER — INSULIN ASPART 100 UNIT/ML ~~LOC~~ SOLN
0.0000 [IU] | Freq: Three times a day (TID) | SUBCUTANEOUS | Status: DC
Start: 2012-10-21 — End: 2012-10-30
  Administered 2012-10-21 – 2012-10-23 (×6): 3 [IU] via SUBCUTANEOUS
  Administered 2012-10-23: 2 [IU] via SUBCUTANEOUS
  Administered 2012-10-23: 3 [IU] via SUBCUTANEOUS
  Administered 2012-10-24: 2 [IU] via SUBCUTANEOUS
  Administered 2012-10-24: 3 [IU] via SUBCUTANEOUS
  Administered 2012-10-25: 5 [IU] via SUBCUTANEOUS
  Administered 2012-10-25: 3 [IU] via SUBCUTANEOUS
  Administered 2012-10-25: 5 [IU] via SUBCUTANEOUS
  Administered 2012-10-26 (×2): 2 [IU] via SUBCUTANEOUS
  Administered 2012-10-27: 3 [IU] via SUBCUTANEOUS
  Administered 2012-10-27 – 2012-10-28 (×5): 2 [IU] via SUBCUTANEOUS
  Administered 2012-10-29 – 2012-10-30 (×3): 3 [IU] via SUBCUTANEOUS
  Administered 2012-10-30: 2 [IU] via SUBCUTANEOUS

## 2012-10-20 NOTE — Progress Notes (Signed)
Subjective: Called by nurse because patient had an episode of bloody bowel movement this morning - blood on toilet w/ dark stools. Spoke to patient's wife and sister about their concern about patient's dizziness and subtle chest pain during these episodes. Explained our thinking that the bleeding is likely secondary to the diverticulosis as well as the limitations of testing and the need to be conservative with medication that could lower patient's threshold for bleeding (aspirin & prasugrel).   Objective: Vital signs in last 24 hours: Filed Vitals:   10/19/12 0431 10/19/12 1404 10/19/12 2015 10/20/12 0451  BP: 126/80 149/76 145/82 126/73  Pulse: 92 100 87 82  Temp: 98.7 F (37.1 C) 98.7 F (37.1 C) 99.2 F (37.3 C) 98.5 F (36.9 C)  TempSrc: Oral Oral Oral Oral  Resp: 16 18 18 18   Height:      Weight: 93.441 kg (206 lb)   92.942 kg (204 lb 14.4 oz)  SpO2: 100% 100% 99% 100%   Physical Exam General: alert, cooperative, and in no apparent distress Orthostatics: supine 144/83, P83; standing 163/99 @ , 156/98 @ ; 160/97 @ HEENT: pupils equal round and reactive to light, vision grossly intact, oropharynx clear and non-erythematous  Neck: supple, no lymphadenopathy, JVD, or carotid bruits Lungs: clear to ascultation bilaterally, normal work of respiration, no wheezes, rales, ronchi Heart: regular rate and rhythm, early systolic murmur grade II/VI at LUSB  Abdomen: soft, non-tender, non-distended, normal bowel sounds Extremities: 2+ DP/PT pulses bilaterally, no cyanosis, clubbing, or edema Neurologic: alert & oriented X3, cranial nerves II-XII intact, strength grossly intact, sensation intact to light touch  Lab Results: Lab Results  Component Value Date   WBC 8.4 10/20/2012   HGB 7.6* 10/20/2012   HCT 22.2* 10/20/2012   MCV 86.7 10/20/2012   PLT 205 10/20/2012   Medications: I have reviewed the patient's current medications. Scheduled Meds: . insulin aspart  0-15 Units  Subcutaneous TID WC & HS  . insulin glargine  25 Units Subcutaneous QHS  . pantoprazole (PROTONIX) IV  40 mg Intravenous QHS   PRN Meds:.sodium chloride  Assessment/Plan: 52 year old male w/ diverticulosis s/p bowel resection, anemia, CAD s/p stent placements, DM2 admitted to ICU for hypotension from acute lower GI bleed likely 2/2 diverticulosis and medication now with symptoms concerning for possible active bleed.  #Lower GI bleed 2/2 diverticulosis: Orthostatics reassuring. Advised patient to monitor for bloody BM and symptoms. Continue pantoprazole for GI prophylaxis. Check Hb & Hct stat and possibly once in the evening based on how values change.      #Anemia: pRBCs x4 since admission with Hb now at 7.6 stable from yesterday. Still below admission Hb of 10.6. Request IV peripheral access to R arm and type & screen for possible transfusion.   #CAD s/p stent placements: Home meds include aspirin and prasugrel. Holding both in setting of acute GI bleed and will need to consider which one to restart at time of discharge. Will call cardiologist today to inquire about this as well as statin therapy since patient meets 2013 ACC/AHA guidelines with history of CAD.  #DM2: Given adequate glucose control over last few days (<200), checking TID now vs. q4h.   #Disposition: Pending improvement in health but possibly in 2-3 days  This is a Psychologist, occupational Note.  The care of the patient was discussed with Dr. Doneen Poisson and the assessment and plan formulated with their assistance.  Please see their attached note for official documentation of the daily encounter.  LOS: 5 days   Jacinta Shoe, Med Student 10/20/2012, 7:15 AM

## 2012-10-20 NOTE — Progress Notes (Signed)
     Antonio [name] Daily Rounding Note 10/20/2012, 9:05 AM  SUBJECTIVE:       No stool or bleeding but some positional dizziness.  No nausea or belly pain.l  OBJECTIVE:         Vital signs in last 24 hours:    Temp:  [98.5 F (36.9 C)-99.2 F (37.3 C)] 98.5 F (36.9 C) (07/21 0451) Pulse Rate:  [82-100] 82 (07/21 0451) Resp:  [18] 18 (07/21 0451) BP: (126-149)/(73-82) 126/73 mmHg (07/21 0451) SpO2:  [99 %-100 %] 100 % (07/21 0451) Weight:  [92.942 kg (204 lb 14.4 oz)] 92.942 kg (204 lb 14.4 oz) (07/21 0451) Last BM Date: 10/18/12 General: looks well, overweight   Heart: RRR Chest: clear bil Abdomen: soft, non tender, active bowel sounds  Extremities: no edema in LE Neuro/Psych:  Pleasant, alert, relaxed, not confused.   Intake/Output from previous day: 07/20 0701 - 07/21 0700 In: 371.3 [P.O.:240; I.V.:131.3] Out: 525 [Urine:525]  Intake/Output this shift:    Lab Results:  Recent Labs  10/19/12 0515 10/19/12 1052 10/20/12 0544  WBC 8.0 8.8 8.4  HGB 7.6* 7.9* 7.6*  HCT 22.1* 22.6* 22.2*  PLT 163 187 205   BMET  Recent Labs  10/18/12 1021 10/19/12 0515  NA 139 138  K 3.2* 3.4*  CL 106 107  CO2 23 23  GLUCOSE 197* 139*  BUN 8 6  CREATININE 0.87 0.91  CALCIUM 8.3* 8.0*    ASSESMENT: * LGIB.  Presumed diverticular.  Colonoscopy 7/19:  Diverticulosis in colon.  S/p previous partial coloectomy for diverticulitis.  EGD normal.7/19 RBC scan 7/18:  No bleeding. No stools or bleeding since the endo/colon *  ABL anemia.  Hgb stable but low.  4 RBCs thus far last was 7/19 *  Hx cardiac stent.  On effient asa for at least 3 years.  Ok to resume but if recurrent bleeding may want to reconsider effient.   PLAN: *  If home today, needs blood count repeated at PMD by the end of the week.  Suggested he talke to his cardiologist in [facility] re need to continue chronic effient.  For now restart this in 5 days.    LOS: 5 days   Jennye Moccasin  10/20/2012, 9:05  AM Pager: [phone]  [place]ber[place] [name] Attending  I have also seen and assessed the patient and agree with the above note. He had one more bloody stool in AM so is being observed further. I explained the nature of diverticular bleeding and how it is difficult to make a positive ID of site and treat.  Iva Boop, MD, [facility] Gastroenterology [phone] (pager) 10/20/2012 8:10 PM

## 2012-10-20 NOTE — Progress Notes (Signed)
Internal Medicine Attending Progress Note Date: 10/20/2012  Patient name: Antonio [name] Medical record number: [id number] Date of birth: 25-May-1960 Age: 52 y.o. Gender: male  I saw and evaluated the patient. I reviewed the student's note and I agree with the student's findings and plan as documented in the student's note.  S:  Antonio [name] had an episode of BRBPR this morning.  This was associated with transient dizziness and a "tingling feeling" in the chest which lasted about 4 minutes and spontaneously resolved.  When we evaluated him after the event he had had no further bleeding and was without pain or dizziness.  He also denied abdominal pain and was tolerating a diabetic diet well.  When spent several minutes answering he and his wife's questions about the likely cause of his recurrent bleeding and what the next steps were.  Physical Exam - key components related to admission:  Filed Vitals:   10/20/12 0451 10/20/12 1201 10/20/12 1203 10/20/12 1205  BP: 126/73 132/81 142/91 151/93  Pulse: 82 85 85 90  Temp: 98.5 F (36.9 C)     TempSrc: Oral     Resp: 18 18 18 20   Height:      Weight: 204 lb 14.4 oz (92.942 kg)     SpO2: 100% 100% 100% 100%   Gen: WDWN man lying comfortable in bed in NAD CV: RRR w/o murmurs, rubs, or gallops Lungs: CTA B anteriorly Abd: Soft, non-tender, w/o masses Neuro: Alert and oriented X 3, affect appropriate, questions were insightful  Lab results:  CBC:  Recent Labs  10/19/12 0515 10/19/12 1052 10/20/12 0544 10/20/12 1210  WBC 8.0 8.8 8.4  --   NEUTROABS 4.7  --   --   --   HGB 7.6* 7.9* 7.6* 7.4*  HCT 22.1* 22.6* 22.2* 21.6*  MCV 87.0 85.9 86.7  --   PLT 163 187 205  --    CBG:  Recent Labs  10/19/12 0803 10/19/12 1148 10/19/12 1615 10/19/12 2013 10/20/12 0850 10/20/12 1159  GLUCAP 122* 166* 151* 166* 142* 169*   Assessment & Plan by Problem:  1) Lower GI Bleed: Likely diverticular in nature.  Recurrent this AM with a mild drop in  hgb.  We continue to hold the prasugrel and ASA.  He is hemodynamically stable and we have no evidence of current bleeding so we will follow him clinicaly.  Will maintain 2 wide bore IVs at all time, continue serial Hcts q6-8 hrs, maintain an active type and screen, and reassess each morning with orthostatics.  If he were to start bleeding again we will consider a repeat RBC scan in hopes of localizing the site of the bleeding.  If significant, he may require recurrent surgical intervention, but we are hopeful it will once again stop on its own off of his anticoagulants.  If the hgb drops below 7.0 or he develops recurrent chest pain we will transfuse 2 units PRBCs.  2) CAD: The indication for lifelong prasugrel is unclear.  His dual antiplatelet therapy has likely unmasked his diverticular bleed, for which he is at risk for recurrence.  We will try to find out why he requires the prasugrel long-term, and if there is no evidence based reason will recommend it be discontinued altogether as the risks may outweigh the benefits in his particular case.  We will have a very low threshold to transfuse 2 units of PRBCs should he develop chest pain as the anemia with resultant decreased oxygen delivery may be the cause.  3) Diabetes: We are continuing the lantus, moderate sliding scale, and cutting back on glucose monitoring to TID.  4) Disposition: Currently pending clinical stability from a lower GI bleeding standpoint.

## 2012-10-21 LAB — CBC
HCT: 18.7 % — ABNORMAL LOW (ref 39.0–52.0)
HCT: 22.9 % — ABNORMAL LOW (ref 39.0–52.0)
Hemoglobin: 6.6 g/dL — CL (ref 13.0–17.0)
Hemoglobin: 8.1 g/dL — ABNORMAL LOW (ref 13.0–17.0)
MCH: 30.2 pg (ref 26.0–34.0)
MCHC: 34.8 g/dL (ref 30.0–36.0)
MCHC: 35.4 g/dL (ref 30.0–36.0)
RDW: 15.3 % (ref 11.5–15.5)
WBC: 9 10*3/uL (ref 4.0–10.5)

## 2012-10-21 LAB — PREPARE RBC (CROSSMATCH)

## 2012-10-21 LAB — GLUCOSE, CAPILLARY
Glucose-Capillary: 102 mg/dL — ABNORMAL HIGH (ref 70–99)
Glucose-Capillary: 166 mg/dL — ABNORMAL HIGH (ref 70–99)

## 2012-10-21 MED ORDER — SODIUM CHLORIDE 0.9 % IV SOLN
250.0000 mL | INTRAVENOUS | Status: DC | PRN
  Administered 2012-10-21: 250 mL via INTRAVENOUS

## 2012-10-21 NOTE — Progress Notes (Signed)
CRITICAL VALUE ALERT  Critical value received:  hgb 6.6  Date of notification:  10/21/12  Time of notification:  0600  Critical value read back:yes  Nurse who received alert:  Julien Nordmann, RN  MD notified (1st page):  Paged [phone]  Time of first page:  0620  MD notified (2nd page): Paged [phone]  Time of second page: [phone]  Responding MD: Dr. Delane Ginger  Time MD responded:  [phone]  No new orders obtained.

## 2012-10-21 NOTE — Progress Notes (Signed)
     [facility] Daily Rounding Note 10/21/2012, 8:35 AM  SUBJECTIVE:       One bloody BM yest AM, nothing interms of blood or stool since.  Walked hall through [employer] PM and no dizziness, dyspnea.   Hgb dropped so 2 units blood ordered for today and placed on ice chips only  OBJECTIVE:         Vital signs in last 24 hours:    Temp:  [98.5 F (36.9 C)-98.6 F (37 C)] 98.5 F (36.9 C) (07/22 0541) Pulse Rate:  [77-90] 82 (07/22 0541) Resp:  [18-20] 18 (07/22 0541) BP: (118-151)/(63-93) 118/75 mmHg (07/22 0541) SpO2:  [100 %] 100 % (07/22 0541) Weight:  [93.033 kg (205 lb 1.6 oz)] 93.033 kg (205 lb 1.6 oz) (07/22 0541) Last BM Date: 10/20/12 General: looks well, comfortable   Heart: RRR Chest: clear.  Breathing unlabored Abdomen: soft, NT, ND. BS active  Extremities: no CCE Neuro/Psych:  Pleasant, alert.  Not confused.    Lab Results:  Recent Labs  10/20/12 0544 10/20/12 1210 10/20/12 2035 10/21/12 0505  WBC 8.4  --  10.6* 9.0  HGB 7.6* 7.4* 7.9* 6.6*  HCT 22.2* 21.6* 22.7* 18.7*  PLT 205  --  252 227   BMET  Recent Labs  10/18/12 1021 10/19/12 0515  NA 139 138  K 3.2* 3.4*  CL 106 107  CO2 23 23  GLUCOSE 197* 139*  BUN 8 6  CREATININE 0.87 0.91  CALCIUM 8.3* 8.0*    ASSESMENT: * LGIB. Presumed diverticular. Colonoscopy 7/19: Diverticulosis in colon. S/p previous partial coloectomy for diverticulitis.  EGD normal 7/19  RBC scan 7/18: No bleeding.  One bloody BM yesterday AM.  No stool or blood since * ABL anemia. Hgb drop > 1gram in 8 hours. 2 more units ordered transfused, this will make for total of 6 thus far.   * Hx cardiac stent. On effient, asa for at least 3 years. On hold.  ? Indication for chronic Effient, Stents placed > 3 years ago.  *  Hypokalemia.    PLAN: *  Restart diet:  Carb mod.   *  No plans for recolonoscope or repeat RBC scan. *  Follow up CBC in AM.    LOS: 6 days   Jennye Moccasin  10/21/2012, 8:35 AM Pager:  [phone]  Attending:  Agree with Ms. Clarita Leber. At this point would continue to observe - if no bleeding into tomorrow would dc. Stay off Effient and ASA x 2 weeks. Not necessary to restrict diet.  Iva Boop, MD, [facility] Gastroenterology [phone] (pager) 10/21/2012 6:10 PM

## 2012-10-21 NOTE — Progress Notes (Signed)
Internal Medicine Attending  Date: 10/21/2012  Patient name: Antonio [name] Medical record number: [id number] Date of birth: 1960-11-14 Age: 52 y.o. Gender: male  I saw and evaluated the patient. I reviewed the resident's note by Dr. Manson Passey and I agree with the resident's findings and plans as documented in his progress note.  When [name] was seen on rounds this AM he was eating his breakfast, receiving blood for a low Hgb, and in no distress.  He had not had another bloody bowel movement since yesterday morning.  The post transfusion Hgb is pending.  If he has no more bloody bowel movements and maintains a stable Hgb he will be discharged home on ASA, a statin, and no prasugrel, as the current body of evidence does not support its continuing use, especially when the risks are higher than usual as in this patient.  If prasugrel is to be restarted, it will be the decision of his PCP and cardiologist.

## 2012-10-21 NOTE — Progress Notes (Signed)
I have seen the patient and reviewed the excellent daily progress note by Heywood Iles MS4 and discussed the care of the patient with them.  See below for documentation of my findings, assessment, and plans.  Subjective: The patient noted 1 bloody BM yesterday morning, but no bowel movements or blood per rectum since that time.  Hb slightly lower this morning, transfused 2 U pRBC's.  The patient notes no lightheadedness this morning.  Objective: Vital signs in last 24 hours: Filed Vitals:   10/21/12 1147 10/21/12 1238 10/21/12 1308 10/21/12 1325  BP: 138/75 117/76 123/71 137/84  Pulse: 78 90 90 99  Temp: 98.7 F (37.1 C) 98.9 F (37.2 C) 98.2 F (36.8 C) 98.7 F (37.1 C)  TempSrc: Oral Oral Oral Oral  Resp: 18 16 18 18   Height:      Weight:      SpO2:       Weight change: 3.2 oz (0.091 kg)  Intake/Output Summary (Last 24 hours) at 10/21/12 1408 Last data filed at 10/21/12 1325  Gross per 24 hour  Intake  387.5 ml  Output      0 ml  Net  387.5 ml   General: alert, cooperative, and in no apparent distress HEENT: pupils equal round and reactive to light, vision grossly intact, oropharynx clear and non-erythematous  Neck: supple, no lymphadenopathy Lungs: clear to ascultation bilaterally, normal work of respiration, no wheezes, rales, ronchi Heart: regular rate and rhythm, grade II/VI early systolic murmur best heard at LUSB Abdomen: soft, non-tender, non-distended, normal bowel sounds  Extremities: no cyanosis, clubbing, or edema Neurologic: alert & oriented X3, cranial nerves II-XII intact, strength grossly intact, sensation intact to light touch   Lab Results: Reviewed and documented in Electronic Record Micro Results: Reviewed and documented in Electronic Record Studies/Results: Reviewed and documented in Electronic Record Medications: I have reviewed the patient's current medications. Scheduled Meds: . insulin aspart  0-15 Units Subcutaneous TID WC  . insulin  glargine  25 Units Subcutaneous QHS  . pantoprazole  40 mg Oral QHS   Continuous Infusions:  PRN Meds:.sodium chloride  Assessment/Plan: The patient is a 52 yo man, history of CAD, HTN, prior diverticulosis s/p partial colectomy, presenting with a diverticular bleed.   # Acute blood loss anemia/Diverticulosis - The patient presented with painless blood per rectum, likely representing diverticular bleed. Tagged RBC scan negative, and colonoscopy only revealed diverticulosis without bleeding. Hb dropped this morning, likely representing the patient's bloody BM 7/21.  No blood per rectum in >24 hours -continue to follow CBC daily -monitor for more BRBPR  -will hold off on effient or aspirin for now (see CAD below)  -GI following, greatly appreciate recs  -continue protonix  -d/c IV fluids today, as patient is better tolerating PO   # CAD - The patient has a history of CAD, with 3 prior stents, most recently in 2011 (pt unsure of BMS vs DES), at [facility]. Patient was informed to continued aspirin and effient for life. Per guideline recs, the patient has been on dual antiplatelet for the required 1 year after stent. Given history of at least 2 prior GI bleeds, one requiring partial colectomy, I believe the risk of bleeding outweigh the benefits of dual antiplatelet therapy at this time.  -hold aspirin and effient until Hb stabilizes  -recommend restarting aspirin alone at discharge; pt to follow-up with his cardiologist for further management thereafter   # DM - history of DM, diagnosed in 2004. Pt takes  home Lantus 51 units daily. CBG's have been well-controlled this hospitalization  -continue Lantus 25, with moderate SSI  -uptitrate Lantus towards home dose as PO intake improves   # HTN - History of HTN, on home lisinopril, atneolol, chlorthalidone  -holding antihypertensives in the setting of acute blood loss anemia. Can re-add as tolerated   Dispo: Disposition is deferred at  this time, awaiting improvement of current medical problems. Anticipated discharge in approximately 1-3 day(s).   The patient does have a current PCP (Dr. Hazle Nordmann, [facility]) and does not need an [facility] hospital follow-up appointment after discharge.  .Services Needed at time of discharge: Y = Yes, Blank = No PT:   OT:   RN:   Equipment:   Other:     LOS: 6 days   Linward Headland, MD 10/21/2012, 2:08 PM

## 2012-10-21 NOTE — Care Management Note (Signed)
Page 1 of 2   10/30/2012     4:29:27 PM   CARE MANAGEMENT NOTE 10/30/2012  Patient:  Antonio [name], Antonio [name]   Account Number:  [id number]  Date Initiated:  10/16/2012  Documentation initiated by:  Avie Arenas  Subjective/Objective Assessment:   GIB - hypotensive.     Action/Plan:   Anticipated DC Date:  10/30/2012   Anticipated DC Plan:  HOME W HOME HEALTH SERVICES      DC Planning Services  CM consult      [facility] Choice  HOME HEALTH   Choice offered to / List presented to:  C-3 Spouse        HH arranged  HH-1 RN      [facility] agency  [facility] Care   Status of service:  Completed, signed off Medicare Important Message given?   (If response is "NO", the following Medicare IM given date fields will be blank) Date Medicare IM given:   Date Additional Medicare IM given:    Discharge Disposition:  HOME W HOME HEALTH SERVICES  Per UR Regulation:  Reviewed for med. necessity/level of care/duration of stay  If discussed at Long Length of Stay Meetings, dates discussed:   10/21/2012  10/23/2012  10/28/2012    Comments:  Contact:  Horn,Debora Spouse [phone]  10/30/12 16:27 Letha Cape RN, BSN [phone] received call from [place] , states [facility] will provide the [facility] for wound care,  I faxed orders and progress notes to [place].  Informed wife and patient.  Soc will begin 24-48 hrs post discharge. Patient for dc today.  Patient's wife has decided she will go to a pharmacy here in [place] to pick up medications instead of going to the [place] in [place]  10/30/12 11:23 Letha Cape RN, BSN [phone] received call from [place] with the [facility] she states they are working on finding an agency for patient for [facility] wound care, also the patient will be following up with the wound clinic in [place] , who will contact him at home to schedule that apt.  Awaiting call back from [place] to let me know what agency will work with patient.  10/30/12 10:21 Letha Cape RN, BSN [phone] received call  from [facility] stating they would not be able to take this patient.  I called the [facility] to inform them of this and told them to find a [facility] for patient in Network with them.  April with the [facility] will call me back  [phone] xt [phone]   10/30/12 10:06 Letha Cape RN,BSN [phone] received call from [facility] with [facility] , stating she had email yesterday stating they could take this patient and this morning she received an email stating she could not take this patient.  She states she will contact the Main office and see if they will reconsider this case.  Awaiting call back from [facility] [place] rep.   10/29/12 12:19 Letha Cape RN, BSN [phone] received call from [place] with Genevieve Norlander she states that since patient lives in [place] the [place] office would have to follow patient and they will not have the staff ready for Thursday, so they will not be able to take patient.  I called Mary with [facility] and she states she think they will be able to take patient.  Spoke with patient to make sure this was ok, he states sure.  I called the [facility] CSW and informed her of this change with hh agency.   10/28/12  14:32 Letha Cape RN, BSN [phone] patient will need [facility] for wound care, I called [place] with the [facility] she states that patient's Social Worker is [employer] [phone] xt [phone]  or Delila Pereyra xt [phone].  The pharmacist is Juanda Crumble at [phone] xt [phone].  We will need to fax scripts in to her to have them filled at the [place] [phone].  Spoke with Ella Bodo , she states what is the patient's preferred agency, the wife wanted [facility], but they do not work with VA patient's for [place] per onsite liason.  Patient states he would like to have [place], informed her [place].  Ella Bodo will make referral to Home [facility] in [place] at [phone] [facility] xt [phone]).  Ella Bodo states she will contact me on Thursday to let me know what agency will be working with patient.  10/24/12 10:35  Letha Cape RN, BSN [phone] patient with fever of  102.9, will check cx's, pt will need to have hgb stable for 48 hrs after transfusion. hgb was 8.5 7/24 and 7/25 hgb is 6.9.  Patient went down for a [facility] GIB Loss test ( trying to localize where bleeding is coming from) .  10/23/12 Letha Cape RN, BSN [phone] surgery consulted today, bleeding is not localized, will transfuse and see if pt stablilizes.  10/21/12 17:17 Letha Cape RN, BSN [phone] patient hgb dropped to 6.6 today, will transfuse, if conts to bleed will need to look into other interventions to be done.   10-16-12 10am Avie Arenas, RNBSN [phone] Showing VA insurance - [facility] [place] notified of admission.

## 2012-10-21 NOTE — Progress Notes (Signed)
  Subjective: Sister present at bedside today. Concerned this morning because Hb dropped to 6.6. Denies any bloody BM, dizziness, lightheadedness, or chest pain this morning. Still on regular diet and drinking fluids.   Called cardiologist's office yesterday (Dr. Eston Mould) but was not able to speak to him or his nurse. Per patient, has never been on statin therapy.   Objective: Vital signs in last 24 hours: Filed Vitals:   10/20/12 1203 10/20/12 1205 10/20/12 2111 10/21/12 0541  BP: 142/91 151/93 130/63 118/75  Pulse: 85 90 77 82  Temp:   98.6 F (37 C) 98.5 F (36.9 C)  TempSrc:   Oral Oral  Resp: 18 20 18 18   Height:      Weight:    93.033 kg (205 lb 1.6 oz)  SpO2: 100% 100% 100% 100%   Physical Exam General: alert, cooperative, and in no apparent distress HEENT: pupils equal round and reactive to light, vision grossly intact, oropharynx clear and non-erythematous  Lungs: clear to ascultation bilaterally, normal work of respiration, no wheezes, rales, ronchi Heart: regular rate and rhythm, early systolic murmur grade II/VI at LUSB  Abdomen: soft, non-tender, non-distended, normal bowel sounds Extremities: 2+ DP/PT pulses bilaterally, no cyanosis, clubbing, or edema Neurologic: alert & oriented X3, cranial nerves II-XII intact, strength grossly intact, sensation intact to light touch  Lab Results: Lab Results  Component Value Date   WBC 9.0 10/21/2012   HGB 6.6* 10/21/2012   HCT 18.7* 10/21/2012   MCV 85.8 10/21/2012   PLT 227 10/21/2012   Medications: I have reviewed the patient's current medications. Scheduled Meds: . insulin aspart  0-15 Units Subcutaneous TID WC  . insulin glargine  25 Units Subcutaneous QHS  . pantoprazole  40 mg Oral QHS   PRN Meds:.sodium chloride  Assessment/Plan: 52 year old male veteran w/ diverticulosis s/p bowel resection, anemia, CAD s/p stent placements, DM2 admitted to ICU for hypotension from acute lower GI bleed likely 2/2 diverticulosis  and medication now with decreasing Hb.  #Lower GI bleed 2/2 diverticulosis and medication: Reassured patient and his caregivers that recent Hb likely decreased due to the bloody BM he had yesterday and that we have low suspicion for a new process. Advised patient to monitor for bloody BM and symptoms. Continue pantoprazole for GI prophylaxis.   #Anemia: pRBCs x4 since admission with 2 more pending today. Hb now at 6.6 down from 7.6 yesterday. Check CBC after transfusion and check q6h thereafter.   #CAD s/p stent placements: Home meds include aspirin and prasugrel. Holding both in setting of acute GI bleed and will need to consider which one to restart at time of discharge. Will call cardiologist again today about Effient as well as PCP to inquire about statin therapy since patient meets 2013 ACC/AHA guidelines with history of CAD.  #DM2: Given adequate glucose control over last few days (<200), checking TID now vs. q4h.   #Disposition: Pending improvement in health but possibly in 2-3 days; will need to have stable Hb for at least 48 hours.  This is a Psychologist, occupational Note.  The care of the patient was discussed with Dr. Doneen Poisson and the assessment and plan formulated with their assistance.  Please see their attached note for official documentation of the daily encounter.   LOS: 6 days   Jacinta Shoe, Med Student 10/21/2012, 7:59 AM

## 2012-10-22 ENCOUNTER — Encounter (HOSPITAL_COMMUNITY): Payer: Self-pay | Admitting: *Deleted

## 2012-10-22 ENCOUNTER — Encounter (HOSPITAL_COMMUNITY)
Admission: EM | Disposition: A | Discharge status: Home Health | Payer: Self-pay | Source: Home / Self Care | Attending: Internal Medicine

## 2012-10-22 ENCOUNTER — Inpatient Hospital Stay (HOSPITAL_COMMUNITY): Payer: Non-veteran care

## 2012-10-22 HISTORY — PX: COLONOSCOPY: SHX5424

## 2012-10-22 LAB — CBC
MCH: 29.5 pg (ref 26.0–34.0)
MCH: 29.9 pg (ref 26.0–34.0)
MCHC: 34.6 g/dL (ref 30.0–36.0)
MCHC: 34.9 g/dL (ref 30.0–36.0)
Platelets: 231 10*3/uL (ref 150–400)
Platelets: 261 10*3/uL (ref 150–400)
RDW: 15.3 % (ref 11.5–15.5)
RDW: 14.6 % (ref 11.5–15.5)

## 2012-10-22 LAB — GLUCOSE, CAPILLARY
Glucose-Capillary: 154 mg/dL — ABNORMAL HIGH (ref 70–99)
Glucose-Capillary: 157 mg/dL — ABNORMAL HIGH (ref 70–99)

## 2012-10-22 SURGERY — COLONOSCOPY
Anesthesia: Moderate Sedation

## 2012-10-22 MED ORDER — DIPHENHYDRAMINE HCL 50 MG/ML IJ SOLN
INTRAMUSCULAR | Status: DC | PRN
  Administered 2012-10-22: 25 mg via INTRAVENOUS

## 2012-10-22 MED ORDER — HYDROCORTISONE 1 % EX CREA
TOPICAL_CREAM | CUTANEOUS | Status: DC | PRN
  Filled 2012-10-22: qty 28

## 2012-10-22 MED ORDER — FENTANYL CITRATE 0.05 MG/ML IJ SOLN
INTRAMUSCULAR | Status: DC | PRN
  Administered 2012-10-22 (×3): 25 ug via INTRAVENOUS

## 2012-10-22 MED ORDER — MIDAZOLAM HCL 5 MG/5ML IJ SOLN
INTRAMUSCULAR | Status: DC | PRN
  Administered 2012-10-22: 1 mg via INTRAVENOUS
  Administered 2012-10-22: 2 mg via INTRAVENOUS
  Administered 2012-10-22: 1 mg via INTRAVENOUS
  Administered 2012-10-22: 2 mg via INTRAVENOUS

## 2012-10-22 NOTE — Progress Notes (Signed)
Pt c/o chest pain at rest at a 2/10.  Denies any other complaints.  VSS.  Hgb 6.4.  Explained to pt that the chest pain is probably caused by the low Hgb, but will continue to monitor.

## 2012-10-22 NOTE — Progress Notes (Signed)
  Subjective: Per nurse, patient had bright red bleeding per rectum this morning x2. Ordered pRBCs x2. Noted chest pain early this AM but no dizziness, lightheadedness, or diaphoresis. Wife and sister at bedside.  S/p pRBC x2 yesterday. Hb now 8.1 vs. 6.6 prior.   Spoke to patient's cardiologist and PCP yesterday to construct timeline of events: -December 2011: first stent placed & started on aspirin and clopidogrel -June 2012: second stent placed and switched from clopidogrel to prasugrel -November 2012: last office visit on file with cardiologist Dr. Eston Mould -December 2012: hospitalized but just catheterization; did not f/u with Dr. Eston Mould in January -May 2014: first encounter with current PCP Dr. Deatra James at the [facility] in [facility] who took over his care from a colleague. [facility]ls[facility]ha[facility] consulted on this patient electronically and did not alter their recommendations for dual antiplatelet therapy.  -PCP is in agreement given recent hospitalization to continue patient on ASA monotherapy only.   Objective: Vital signs in last 24 hours: Filed Vitals:   10/21/12 1525 10/21/12 1622 10/21/12 2104 10/22/12 0515  BP: 142/86 131/74 127/80 101/67  Pulse: 101 92 100 102  Temp: 98.5 F (36.9 C) 98.5 F (36.9 C) 99.3 F (37.4 C) 98.8 F (37.1 C)  TempSrc: Oral Oral Oral Oral  Resp: 18 18 20 18   Height:      Weight:    92.7 kg (204 lb 5.9 oz)  SpO2:   100% 100%   Physical Exam General: alert, cooperative, and in no apparent distress HEENT: pupils equal round and reactive to light, vision grossly intact, oropharynx clear and non-erythematous  Heart: regular rate and rhythm, early systolic murmur grade II/VI at LUSB  Abdomen: soft, non-tender, non-distended, normal bowel sounds Extremities: 2+ DP/PT pulses bilaterally, no cyanosis, clubbing, or edema Neurologic: alert & oriented X3, cranial nerves II-XII intact, strength grossly intact, sensation intact to light touch  Lab  Results: Lab Results  Component Value Date   WBC 10.9* 10/21/2012   HGB 8.1* 10/21/2012   HCT 22.9* 10/21/2012   MCV 85.4 10/21/2012   PLT 245 10/21/2012   Medications: I have reviewed the patient's current medications. Scheduled Meds: . insulin aspart  0-15 Units Subcutaneous TID WC  . insulin glargine  25 Units Subcutaneous QHS  . pantoprazole  40 mg Oral QHS   PRN Meds:.sodium chloride  Assessment/Plan: 52 year old male veteran w/ diverticulosis s/p bowel resection, anemia, CAD s/p stent placements, DM2 admitted to ICU for hypotension from acute lower GI bleed likely 2/2 diverticulosis and medication now with decreasing Hb likely 2/2 diverticular bleed.  #Decreasing Hb likely 2/2 diverticular bleed: Per GI, scheduled for colonoscopy today to isolate source of bleed. -Advised patient to monitor for bloody BM and symptoms. -Continue pantoprazole for GI prophylaxis.  -pRBCs x6 since admission with 2 more pending today. -Check CBC after transfusion and check q6h thereafter.   #CAD s/p stent placements: PCP in agreement to continue patient on ASA monotherapy pending discharge.  #DM2: Continue CBG TID.   #Disposition: Pending improvement in health but possibly in 2-3 days; will need to have stable Hb for at least 48 hours.  This is a Psychologist, occupational Note.  The care of the patient was discussed with Dr. Doneen Poisson and the assessment and plan formulated with their assistance.  Please see their attached note for official documentation of the daily encounter.   LOS: 7 days   Beather Arbour, Med Student 10/22/2012, 6:51 AM

## 2012-10-22 NOTE — Progress Notes (Signed)
I have seen the patient and reviewed the daily progress note by Heywood Iles MS4 and discussed the care of the patient with them.  See below for documentation of my findings, assessment, and plans.  Subjective: The patient notes continued blood per rectum today.  Hb has dropped to 6.4 this morning.  Plan for repeat colonoscopy today.  Objective: Vital signs in last 24 hours: Filed Vitals:   10/22/12 1635 10/22/12 1640 10/22/12 1645 10/22/12 1650  BP: 142/80 139/83 150/85 141/83  Pulse:      Temp:      TempSrc:      Resp: 16 31 47 40  Height:      Weight:      SpO2: 100% 100% 100% 100%   Weight change: -11.7 oz (-0.333 kg)  Intake/Output Summary (Last 24 hours) at 10/22/12 1703 Last data filed at 10/22/12 0930  Gross per 24 hour  Intake  132.5 ml  Output      0 ml  Net  132.5 ml  General: alert, cooperative, and in no apparent distress HEENT: pupils equal round and reactive to light, vision grossly intact, oropharynx clear and non-erythematous  Neck: supple, no lymphadenopathy Lungs: clear to ascultation bilaterally, normal work of respiration, no wheezes, rales, ronchi Heart: regular rate and rhythm, grade II/VI early systolic murmur best heard at LUSB Abdomen: soft, non-tender, non-distended, normal bowel sounds  Extremities: no cyanosis, clubbing, or edema Neurologic: alert & oriented X3, cranial nerves II-XII intact, strength grossly intact, sensation intact to light touch  Lab Results: Reviewed and documented in Electronic Record Micro Results: Reviewed and documented in Electronic Record Studies/Results: Reviewed and documented in Electronic Record Medications: I have reviewed the patient's current medications. Scheduled Meds: . insulin aspart  0-15 Units Subcutaneous TID WC  . insulin glargine  25 Units Subcutaneous QHS  . pantoprazole  40 mg Oral QHS   Continuous Infusions:  PRN Meds:.sodium chloride, diphenhydrAMINE, fentaNYL, hydrocortisone cream,  midazolam Assessment/Plan: The patient is a 52 yo man, history of CAD, HTN, prior diverticulosis s/p partial colectomy, presenting with a diverticular bleed.   # Acute blood loss anemia/Diverticulosis - The patient presented with painless blood per rectum, likely representing diverticular bleed. The patient notes continued blood per rectum, with drop in Hb today. -continue to follow CBC daily  -monitor for more BRBPR  -will hold off on effient or aspirin for now (see CAD below)  -GI following, greatly appreciate recs  -continue protonix  -transfuse 2 units pRBC's today -plan for repeat colonoscopy today to identify source of bleeding  # CAD - The patient has a history of CAD, with 3 prior stents, most recently 2012 (DES), at [facility]. Patient presented on dual antiplatelet therapy, but after reviewing guidelines and discussing with the patient's PCP, we decided to discharge the patient home on aspirin monotherapy.  Holding all anticoagulants now in the setting of GI bleed.  -hold aspirin and effient until Hb stabilizes  -recommend restarting aspirin monotherapy at discharge  # DM - history of DM, diagnosed in 2004. Pt takes home Lantus 51 units daily. CBG's have been well-controlled this hospitalization  -continue Lantus 25, with moderate SSI  -uptitrate Lantus towards home dose as PO intake improves   # HTN - History of HTN, on home lisinopril, atneolol, chlorthalidone  -holding antihypertensives in the setting of acute blood loss anemia. Can re-add as tolerated   Dispo: Disposition is deferred at this time, awaiting improvement of current medical problems. Anticipated discharge in approximately  1-3 day(s).   The patient does have a current PCP (Dr. Hazle Nordmann, [facility]) and does not need an [facility] hospital follow-up appointment after discharge.  .Services Needed at time of discharge: Y = Yes, Blank = No PT:   OT:   RN:   Equipment:   Other:     LOS: 7 days   Linward Headland, MD 10/22/2012, 5:03 PM

## 2012-10-22 NOTE — Op Note (Signed)
[facility] [address] [place], [address]   COLONOSCOPY PROCEDURE REPORT  PATIENT: [name], Antonio  MR#: [id number] BIRTHDATE: 06-Feb-1961 , 51  yrs. old GENDER: Male ENDOSCOPIST: Iva Boop, MD, [facility] PROCEDURE DATE:  10/22/2012 PROCEDURE:   Colonoscopy, diagnostic ASA CLASS:   Class III INDICATIONS:hematochezia.   recurrent MEDICATIONS: Fentanyl 75 mcg IV, Versed 6 mg IV, and Benadryl 25 mg IV  DESCRIPTION OF PROCEDURE:   After the risks benefits and alternatives of the procedure were thoroughly explained, informed consent was obtained.  A digital rectal exam revealed no rectal mass.   The XB-[id number] ([id number]) and Pentax Adult Colon [id number] endoscope was introduced through the anus and advanced to the proximal transverse colon. No adverse events experienced.   The quality of the prep was fair.  The instrument was then slowly withdrawn as the colon was fully examined.      COLON FINDINGS: A small to moderate  amount of fresh blood was found in the transverse colon.   Moderate diverticulosis was noted in the transverse colon.  Same area. I think this was transverse but it was 60-70 cm from anus. Area marked distal and proximal with clips. Unable to find active bleeding or visible vessel. Extensive irrigation, position change, use of forceps to move folds. Retroflexion was not performed. The time to cecum=  .  Withdrawal time=  .  The scope was withdrawn and the procedure completed. COMPLICATIONS: There were no complications.  ENDOSCOPIC IMPRESSION: 1.   A small amount of blood was found in the ?transverse colon 60-70 cm  - fresh blood seen but could not find exact bleeding site. 2.   Moderate diverticulosis was noted in the transverse colon - thought to be source of bleeding 3. No blood proximal or distal to this area though some dark stool distal probably reflecting old blood. RECOMMENDATIONS: Support, remain of prasugrel - may not yet be out  of system. KUB to localize better Clears overnight If further bleeding repeat colonoscopy vs trying IR - angiography if they will based upon the localization here.  eSigned:  Iva Boop, MD, [facility] 10/22/2012 5:35 PM

## 2012-10-22 NOTE — Progress Notes (Signed)
     [facility] Daily Rounding Note 10/22/2012, 9:26 AM  SUBJECTIVE:       Bled at least 6 times yesterday.  No abdominal pain or nausea.  Having chest discomfort that comes and goes at rest.  No dizzy, lightheaded.  CP not worse when he gets moving.    OBJECTIVE:         Vital signs in last 24 hours:    Temp:  [98.2 F (36.8 C)-99.3 F (37.4 C)] 98.7 F (37.1 C) (07/23 0915) Pulse Rate:  [70-102] 70 (07/23 0915) Resp:  [12-20] 12 (07/23 0915) BP: (101-143)/(66-86) 117/77 mmHg (07/23 0915) SpO2:  [100 %] 100 % (07/23 0515) Weight:  [92.7 kg (204 lb 5.9 oz)] 92.7 kg (204 lb 5.9 oz) (07/23 0515) Last BM Date: 10/21/12 General: looks well.    Heart: RRR Chest: clear bil.  Breathing not labored Abdomen: soft, obese, NT, active BS  Extremities: no CCE Neuro/Psych:  Pleasant, alert, no deficits, not confused.   Intake/Output from previous day: 07/22 0701 - 07/23 0700 In: 876.3 [P.O.:120; Blood:756.3] Out: -   Intake/Output this shift:    Lab Results:  Recent Labs  10/21/12 0505 10/21/12 2009 10/22/12 0540  WBC 9.0 10.9* 10.0  HGB 6.6* 8.1* 6.4*  HCT 18.7* 22.9* 18.5*  PLT 227 245 231    ASSESMENT: *  LGIB. Presumed diverticular. Colonoscopy 7/19: Diverticulosis in colon. S/p previous partial coloectomy for diverticulitis.  EGD normal 7/19  RBC scan 7/18: No bleeding.  Several bloody stools yesterday.  *  ABL anemia.  Got 2 units blood, 1.6 gram drop in Hgb since then.  2 more units ordered.    PLAN: *   Set up for repeat colonoscopy late today, after 5 PM.  Prep is tap water enema.     LOS: 7 days   Jennye Moccasin  10/22/2012, 9:26 AM Pager: [phone]  [facility] Attending  I have also seen and assessed the patient and agree with the above note. Will see if I can at least localize segment of bowel weher he is bleeding.  Iva Boop, MD, [facility] [phone] (pager) 10/22/2012 3:35 PM

## 2012-10-22 NOTE — Progress Notes (Signed)
Internal Medicine Attending  Date: 10/22/2012  Patient name: Antonio [name] Medical record number: [id number] Date of birth: 02-18-1961 Age: 52 y.o. Gender: male  I saw and evaluated the patient. I reviewed the resident's note by Dr. Manson Passey and I agree with the resident's findings and plans as documented in his progress note.  [name] rebled again last night and has required an additional 2 units of PRBCs.  His diverticular bleed appears to require more definitive intervention at this point.  I appreciate GI's attempts at localizing the bleed in hopes of directing further intervention.  Further evaluation and possible therapy pending the results of the colonoscopy.

## 2012-10-23 ENCOUNTER — Encounter (HOSPITAL_COMMUNITY): Payer: Self-pay | Admitting: Internal Medicine

## 2012-10-23 ENCOUNTER — Inpatient Hospital Stay (HOSPITAL_COMMUNITY): Payer: Non-veteran care

## 2012-10-23 DIAGNOSIS — K922 Gastrointestinal hemorrhage, unspecified: Secondary | ICD-10-CM

## 2012-10-23 LAB — URINALYSIS W MICROSCOPIC + REFLEX CULTURE
Bilirubin Urine: NEGATIVE
Ketones, ur: NEGATIVE mg/dL
Nitrite: NEGATIVE
Protein, ur: NEGATIVE mg/dL
Urobilinogen, UA: 0.2 mg/dL (ref 0.0–1.0)
pH: 5.5 (ref 5.0–8.0)

## 2012-10-23 LAB — GLUCOSE, CAPILLARY
Glucose-Capillary: 156 mg/dL — ABNORMAL HIGH (ref 70–99)
Glucose-Capillary: 145 mg/dL — ABNORMAL HIGH (ref 70–99)

## 2012-10-23 LAB — TYPE AND SCREEN
Unit division: 0
Unit division: 0

## 2012-10-23 LAB — CBC
HCT: 24.9 % — ABNORMAL LOW (ref 39.0–52.0)
HCT: 24.2 % — ABNORMAL LOW (ref 39.0–52.0)
Hemoglobin: 8.6 g/dL — ABNORMAL LOW (ref 13.0–17.0)
Hemoglobin: 8.5 g/dL — ABNORMAL LOW (ref 13.0–17.0)
MCH: 29.4 pg (ref 26.0–34.0)
MCHC: 34.5 g/dL (ref 30.0–36.0)
RDW: 14.7 % (ref 11.5–15.5)
WBC: 13.2 10*3/uL — ABNORMAL HIGH (ref 4.0–10.5)

## 2012-10-23 LAB — HEMOGLOBIN AND HEMATOCRIT, BLOOD: HCT: 24.8 % — ABNORMAL LOW (ref 39.0–52.0)

## 2012-10-23 LAB — DIRECT ANTIGLOBULIN TEST (NOT AT ARMC): DAT, IgG: NEGATIVE

## 2012-10-23 MED ORDER — SODIUM CHLORIDE 0.9 % IV SOLN: 250.0000 mL | INTRAVENOUS | Status: DC | PRN

## 2012-10-23 MED ORDER — ACETAMINOPHEN 325 MG PO TABS
650.0000 mg | ORAL_TABLET | Freq: Four times a day (QID) | ORAL | Status: DC | PRN
  Administered 2012-10-23 – 2012-10-25 (×6): 650 mg via ORAL
  Filled 2012-10-23 (×4): qty 2
  Filled 2012-10-23: qty 1
  Filled 2012-10-23: qty 2

## 2012-10-23 MED ORDER — SODIUM CHLORIDE 0.9 % IV SOLN
INTRAVENOUS | Status: DC
  Administered 2012-10-23 – 2012-10-27 (×9): via INTRAVENOUS

## 2012-10-23 NOTE — Progress Notes (Signed)
Dr.Sadek came & saw pt. & ordered to hold Tylenol for now & recheck at midnight.Ice pack applied to forehead .Will cont.to monitor pt.

## 2012-10-23 NOTE — Progress Notes (Signed)
Subjective: The patient spiked a temperature overnight.  CXR negative, urine culture and blood cultures pending.  Patient afebrile today, with no complaints.  Colonoscopy yesterday demonstrated blood in his transverse colon, though no specific source could be identified.  The patient notes no blood per rectum since his colonoscopy.  We had a long conversation with the patient's family this morning.  They noted that they feel they are approaching the point where they would want to consider surgery for treatment of his diverticular bleed.  Objective: Vital signs in last 24 hours: Filed Vitals:   10/23/12 0143 10/23/12 0353 10/23/12 0406 10/23/12 0556  BP: 108/68  120/75 107/73  Pulse: 127  116 107  Temp: 102.2 F (39 C) 100 F (37.8 C) 99.5 F (37.5 C) 99.1 F (37.3 C)  TempSrc: Oral  Oral Oral  Resp: 18  18 20   Height:      Weight:      SpO2: 99%  99% 100%   Weight change:   Intake/Output Summary (Last 24 hours) at 10/23/12 1207 Last data filed at 10/23/12 0900  Gross per 24 hour  Intake    660 ml  Output    930 ml  Net   -270 ml  General: alert, cooperative, and in no apparent distress HEENT: pupils equal round and reactive to light, vision grossly intact, oropharynx clear and non-erythematous  Neck: supple, no lymphadenopathy Lungs: clear to ascultation bilaterally, normal work of respiration, no wheezes, rales, ronchi Heart: regular rate and rhythm, grade II/VI early systolic murmur best heard at LUSB Abdomen: soft, non-tender, non-distended, normal bowel sounds  Extremities: no cyanosis, clubbing, or edema Neurologic: alert & oriented X3, cranial nerves II-XII intact, strength grossly intact, sensation intact to light touch  Lab Results: Basic Metabolic Panel:  Recent Labs Lab 10/18/12 1021 10/19/12 0515  NA 139 138  K 3.2* 3.4*  CL 106 107  CO2 23 23  GLUCOSE 197* 139*  BUN 8 6  CREATININE 0.87 0.91  CALCIUM 8.3* 8.0*   CBC:  Recent Labs Lab  10/17/12 0330  10/19/12 0515  10/22/12 1906 10/23/12 0530  WBC 16.7*  < > 8.0  < > 12.7* 13.6*  NEUTROABS 14.1*  --  4.7  --   --   --   HGB 7.9*  < > 7.6*  < > 10.6* 8.6*  HCT 22.6*  < > 22.1*  < > 30.4* 24.9*  MCV 84.3  < > 87.0  < > 85.9 85.0  PLT 144*  < > 163  < > 261 221  < > = values in this interval not displayed. CBG:  Recent Labs Lab 10/21/12 2114 10/22/12 0833 10/22/12 1227 10/22/12 1819 10/22/12 2238 10/23/12 0756  GLUCAP 176* 154* 157* 152* 183* 171*   Studies/Results: Dg Chest 2 View  10/23/2012   *RADIOLOGY REPORT*  Clinical Data: Fever post colonoscopy portable chest x-ray I have  CHEST - 2 VIEW  Comparison: Portable chest x-ray of 10/16/2012  Findings: No active infiltrate or effusion is seen.  No pneumothorax is noted.  Mediastinal contours appear stable.  The heart is within normal limits in size.  There is no evidence of free intraperitoneal air.  IMPRESSION:  1.  No active lung disease. 2.  No pneumothorax.  No free air.   Original Report Authenticated By: Dwyane Dee, M.D.   Dg Abd 1 View  10/22/2012   *RADIOLOGY REPORT*  Clinical Data: Metal clips placed at colonoscopy today, 60 - 70 cm from the  anus.  Query distal transverse.  ABDOMEN - 1 VIEW  Comparison: CT abdomen and pelvis 07/22/2009  Findings: Surgical clips are demonstrated in the upper abdomen and are projected over the transverse colon at the hepatic flexure and just to the right of midline at the level of L2.  Scattered gas in the colon and small bowel.  This is likely post procedural.  No radiopaque stones.  Mild degenerative changes in the lumbar spine and hips.  IMPRESSION: Surgical clips placed in the transverse colon at the hepatic flexure and mid transverse region.  Nonobstructive bowel gas pattern.   Original Report Authenticated By: Burman Nieves, M.D.   Medications: I have reviewed the patient's current medications. Scheduled Meds: . insulin aspart  0-15 Units Subcutaneous TID WC  .  insulin glargine  25 Units Subcutaneous QHS  . pantoprazole  40 mg Oral QHS   Continuous Infusions:  PRN Meds:.sodium chloride, acetaminophen, hydrocortisone cream Assessment/Plan: The patient is a 52 yo man, history of CAD, HTN, prior diverticulosis s/p partial colectomy, presenting with a diverticular bleed.   # Acute blood loss anemia/Diverticulosis - The patient presented with painless blood per rectum, likely representing diverticular bleed. Continued drop in Hb.  The patient's family would like to discuss the possibility of surgery. -continue to follow CBC q12 hours -monitor for more BRBPR  -will hold off on effient or aspirin (see CAD below)  -GI following, greatly appreciate recs  -continue protonix   # Fever - patient spiked fever overnight.  CXR negative.  Urine and blood cultures pending.  Labs for transfusion reaction were negative.  Patient is asymptomatic.  Differential includes febrile non-hemolytic transfusion reaction vs bacterial infection (?TTBI).  For now, with no source identified and patient hemodynamically stable without symptoms, we will hold antibiotics.  If patient becomes unstable, can start broad spectrum antibiotics (ie vanc and zosyn), since the source is unknown. -follow-up blood cultures, urine culture -tylenol prn for fever  # CAD - The patient has a history of CAD, with 3 prior stents, most recently 2012 (DES), at [facility]. Patient presented on dual antiplatelet therapy, but after reviewing guidelines and discussing with the patient's PCP, we decided to discharge the patient home on aspirin monotherapy. Holding all anticoagulants now in the setting of GI bleed.  -hold aspirin until Hb stabilizes  -recommend restarting aspirin monotherapy at discharge   # DM - history of DM, diagnosed in 2004. Pt takes home Lantus 51 units daily. CBG's have been well-controlled this hospitalization  -continue Lantus 25, with moderate SSI  -uptitrate Lantus towards  home dose as PO intake improves   # HTN - History of HTN, on home lisinopril, atneolol, chlorthalidone  -holding antihypertensives in the setting of acute blood loss anemia. Can re-add as tolerated   Dispo: Disposition is deferred at this time, awaiting improvement of current medical problems. Anticipated discharge in approximately 2-4 day(s).   The patient does have a current PCP (Dr. Hazle Nordmann, [facility]) and does not need an [facility] hospital follow-up appointment after discharge   .Services Needed at time of discharge: Y = Yes, Blank = No PT:   OT:   RN:   Equipment:   Other:     LOS: 8 days   Linward Headland, MD 10/23/2012, 12:07 PM

## 2012-10-23 NOTE — Progress Notes (Signed)
     [facility] Daily Rounding Note 2012-11-01, 9:16 AM  SUBJECTIVE:     No stools.  Feels well.    Temp to 102.7 last night. Negative U/A and CXR.  Developed some painless scrotal and penile edema around time of the colonoscopy.  No dysuria.  Wife is frustrated by lack of answers although she understands the conundrum of diverticular bleed.  Thinking about getting husband transferred to [facility]  OBJECTIVE:         Vital signs in last 24 hours:    Temp:  [98.5 F (36.9 C)-102.9 F (39.4 C)] 99.1 F (37.3 C) 2022-11-02 0556) Pulse Rate:  [89-129] 107 11-02-2022 0556) Resp:  [14-47] 20 November 02, 2022 0556) BP: (106-181)/(58-111) 107/73 mmHg 11-02-2022 0556) SpO2:  [97 %-100 %] 100 % 2022-11-02 0556) Last BM Date: 10/22/12 General: looks well   Heart: RRR Chest: clear bil.  No labored breathing or cough Abdomen: soft, NT, ND.  BS active GU  Visually: mild edema in penis and scrotum, no discharge, no erythema.  Did not palpate. Extremities: no CCE Neuro/Psych:  Pleasant, alert.  No deficits.    Lab Results:  Recent Labs  10/22/12 0540 10/22/12 1906 November 01, 2012 0530  WBC 10.0 12.7* 13.6*  HGB 6.4* 10.6* 8.6*  HCT 18.5* 30.4* 24.9*  PLT 231 261 221    Studies/Results: Dg Chest 2 View November 01, 2012    IMPRESSION:  1.  No active lung disease. 2.  No pneumothorax.  No free air.   Original Report Authenticated By: Dwyane Dee, M.D.   Dg Abd 1 View 10/22/2012    IMPRESSION: Surgical clips placed in the transverse colon at the hepatic flexure and mid transverse region.  Nonobstructive bowel gas pattern.   Original Report Authenticated By: Burman Nieves, M.D.    ASSESMENT: *  GI bleed Colonoscopy #2 7/23 with fresh blood seen at 60 to 70 CM.  Precise location of bleeding not seen, diverticulosis note in the region.   *  ABL anemia.  8 units of blood thus far, last were 7/23 in AM.  Hgb dropped 2 grams from post transfusion level yesterday.  *  Fever.  Penile edema, U/A negative.   PLAN: *   Allow regular diet.  *  Cbc in AM   LOS: 8 days   Jennye Moccasin  Nov 01, 2012, 9:16 AM Pager: [phone]   GI Attending  I have also seen and assessed the patient and agree with the above note. Please see other note in chart also  Iva Boop, MD, [facility] [phone] (pager) November 01, 2012 8:01 PM

## 2012-10-23 NOTE — Progress Notes (Signed)
Internal Medicine Attending  Date: 10/23/2012  Patient name: Maximillian Habibi Medical record number: [id number] Date of birth: 1960/08/01 Age: 52 y.o. Gender: male  I saw and evaluated the patient. I reviewed the resident's note by Dr. Manson Passey and I agree with the resident's findings and plans as documented in his progress note.  Mr. Thivierge was transfused 2 units of PRBCs.  He did develop a fever during the transfusion but it is unclear if this was a non-hemolytic febrile transfusion reaction or from another source.  Fever work-up to date has been unrevealing.  We had a long discussion with the patient and his wife about his options at this point as they are tiring of the waiting and re-bleeding.  We also assured them we would try to help facilitate transfer to [facility] if that is what they should want in order to obtain a second opinion.  They were interested in getting surgery's opinion about resection and they were consulted.  As of now, pending any changes as desired by the patient and his wife, we will continue observation and obtain a tagged red blood cell scan should he re-bleed in order to further localize the lesion and direct more definitive intervention.

## 2012-10-23 NOTE — Progress Notes (Signed)
Subjective: S/p pRBC x2 yesterday. Repeat colonoscopy localized bleeding to one segment. Clips were placed as reference points and found to be at the transverse colon at the hepatic flexure and mid-transverse region on f/u KUB.     Overnight, had temperature of 102.49F but now 99.73F. Blood and urine cultures were drawn and was started on telemetry. Blood transfusion Ab studies negative. LDH 222.  Wife and sister at bedside. Spoke with wife about fever this morning and results for workup last night as well as other options over waiting for bleed to resolve. Interested in transfer to [facility] if it might mean recovery but appreciates the care they receive here. Would appreciate surgery's recommendations as well.   Objective: Vital signs in last 24 hours: Filed Vitals:   10/23/12 0143 10/23/12 0353 10/23/12 0406 10/23/12 0556  BP: 108/68  120/75 107/73  Pulse: 127  116 107  Temp: 102.2 F (39 C) 100 F (37.8 C) 99.5 F (37.5 C) 99.1 F (37.3 C)  TempSrc: Oral  Oral Oral  Resp: 18  18 20   Height:      Weight:      SpO2: 99%  99% 100%   Physical Exam General: alert, cooperative, and in no apparent distress HEENT: pupils equal round and reactive to light, vision grossly intact, oropharynx clear and non-erythematous  Heart: regular rate and rhythm, early systolic murmur grade II/VI at LUSB  Abdomen: soft, non-tender, non-distended, normal bowel sounds Extremities: 2+ DP/PT pulses bilaterally, no cyanosis, clubbing, or edema Neurologic: alert & oriented X3, cranial nerves II-XII intact, strength grossly intact, sensation intact to light touch  Lab Results: Lab Results  Component Value Date   WBC 13.6* 10/23/2012   HGB 8.6* 10/23/2012   HCT 24.9* 10/23/2012   MCV 85.0 10/23/2012   PLT 221 10/23/2012   Medications: I have reviewed the patient's current medications. Scheduled Meds: . insulin aspart  0-15 Units Subcutaneous TID WC  . insulin glargine  25 Units Subcutaneous QHS  .  pantoprazole  40 mg Oral QHS   PRN Meds:.sodium chloride, acetaminophen, hydrocortisone cream  Assessment/Plan: 52 year old male veteran w/ diverticulosis s/p bowel resection, anemia, CAD s/p stent placements, DM2 admitted to ICU for hypotension from acute lower GI bleed likely 2/2 diverticular bleeding in the transverse colon and medication now with fever.  #Fever: Differential includes delayed hemolytic transfusion reaction, infection, DVT. Elevated WBCs over hospitalization make infection possible though remained around the upper limit of normal. DVT possible in absence of anticoagulation and bed rest but less likely with physical exam findings. Multiple transfusions over hospital course make reaction likely though Ab studies overnight were negative and LDH unremarkable. -Check direct and indirect Coombs test and reticulocyte count to r/o delayed hemolysis -Blood and urine cultures pending to r/o infection. -Tylenol prn for fever   #Decreasing Hb 2/2 transverse colon bleeding: pRBCs x8 since admission.  -Consulted surgery; will follow Hb/Hct and offer recommendations; checking CBC q12h -Advised patient to monitor for bloody BM and symptoms. -Continue pantoprazole for GI prophylaxis.  -GI following and appreciate recs  #CAD s/p stent placements: PCP in agreement to continue patient on ASA monotherapy pending discharge.  #DM2: Continue CBG TID.   #Disposition: Pending improvement in health but possibly in 2-3 days; will need to have stable Hb for at least 48 hours.  This is a Psychologist, occupational Note.  The care of the patient was discussed with Dr. Janalyn Harder and the assessment and plan formulated with their assistance.  Please see their  attached note for official documentation of the daily encounter.   LOS: 8 days   Beather Arbour, Med Student 10/23/2012, 8:09 AM

## 2012-10-23 NOTE — Progress Notes (Addendum)
[facility]  See PA note also   No bleeding today Patient and family satisfied with [facility] opinion and I agree that we should avoid surgery if possible.  i would say that the bleeding area is localized to the area between the clips placed and visible on Xray - was proximal transverse colon, I could not pinpoint the bleeding site however.  Prasugrel probably finally gone - if he does rebleed options include rescope vs. Tagged RBC vs asking IR to aim for area marked by clips (I think this is sensible).  I know that what I have done is not what we have been used to but some gastroenterologists have been using this approach to localize bleeding sites and have written about it.   He is also febrile again - no focal signs/sxs, scrotal edema better, abdomen benign.  Iva Boop, MD, [facility] [phone] (pager) 10/23/2012 8:01 PM

## 2012-10-23 NOTE — Progress Notes (Signed)
Noted pt.had a temp .@ 2242  Of 102.9;rechecked the temp.102.7;called Dr.Sadek who is on call & came to see pt.ordered to do [facility] X2;urine C/S  & CXR. & tylenol 650mg  for fever.

## 2012-10-23 NOTE — Progress Notes (Signed)
  I have seen and examined the patient, and reviewed the daily progress note by Heywood Iles, MS4 and discussed the care of the patient with them. Please see my progress note from 10/23/2012 for further details regarding assessment and plan.    Signed:  Linward Headland, MD 10/23/2012, 5:35 PM

## 2012-10-23 NOTE — Consult Note (Signed)
I have seen and examined the patient and agree with the assessment and plans.  I discussed this in detail with the patient and his wife.  The source of bleeding is still not absolutely certain.  If he starts actively bleeding again, I thinks he needs repeat colonoscopy to try to better identify the source and if not, the an arteriogram by interventional radiology to hopefully diagnose the source.  All I could currently offer would be a subtotal colectomy. He is currently stable.  We will follow him closely.  Corianna Avallone A. Magnus Ivan  MD, FACS

## 2012-10-23 NOTE — Progress Notes (Signed)
Spoke with Antonio [name] and he stated to monitor temperature and give next tylenol when it is due again.

## 2012-10-23 NOTE — Progress Notes (Signed)
S: Nurse called stating pt had 102 F. Pt doesn't have any complaints. Able to tolerate liquid diet w/o n/v/d. Pt no repeat bloody BM. Denied any HA, weakness, cough, urinary frequency/urgency, abdominal discomfort, nausea/diarrhea, neck pain, CP or SOB.   O:  Filed Vitals:   10/23/12 0143  BP: 108/68  Pulse: 127  Temp: 102.2 F (39 C)  Resp: 18   General: resting in bed, NAD HEENT: PERRL, EOMI, no scleral icterus Cardiac: tachy with RR, no rubs, murmurs or gallops Pulm: clear to auscultation bilaterally, moving normal volumes of air Abd: soft, nontender, nondistended, BS present, umbilical scar well healed Ext: warm and well perfused, no pedal edema, no calf tenderness or palpable venous cords, no urticaria lesions, no purpura Neuro: alert and oriented X3, cranial nerves II-XII grossly intact  A/P: DDX includes: infection as pt has leukocytosis and fever but no localizing complaints, DVT vs PE as pt has been bleeding and not on anticoagulation and poor ambulation (pt [place] 5 only for tachycardia), pt with recent colonoscopy although benign abdominal exam making perforation less likely as well as negative KUB on 7/23 post-procedure, and delayed transfusion reaction.   Blood bank was called in regards to pts initial type and screen along with antibody screen results since pt has received a total of 8U of blood since admission with last on 7/23. Pt had negative screen w/o reactions.   -CXR -BCx, UCx -stat CBC and LDH -tylenol prn fever -telemetry -would consider LE dopplers if no other source or pt complains of lower extremity pain -would also consider transfusion reaction workup if continues to spike w/o etiology -nursing staff was relayed plan and to contact if any acute changes or pt develops new complaints.   Christen Bame, MD PGY-2 Pgr: [phone]

## 2012-10-23 NOTE — Progress Notes (Addendum)
Temp.still 101.6;Called Dr.Sadek & said she is coming to see pt.Marland Kitchen

## 2012-10-23 NOTE — Consult Note (Signed)
Reason for Consult:lower GI bleeding Referring Physician: Dr. Josem Kaufmann  HPI: Antonio [name] is a 52 year old AAM with a history of diverticulitis, CAD s/p stent placement x3 last being in 2010., diabetes mellitus, diverticulitis, hemorrhoids, colon resection in 2011, hypertension, MI and GERD. He has been on Effient since 2009 and Plavix prior to that, he also takes aspirin.  He was admitted by CCM on 10/15/12 for GI bleeding.  The patient states that prior to the admission, he had a large bowel movement with frank blood, this was followed with another episode of frank bleeding that didn't stop accord.  On ED arrival, he was hypotensive, diaphoretic.  He was transfused throughout his admission totaling 8 units.  GI was consulted who stopped blood thinners.  He underwent an endoscopy on the 19th which was negative and a colonoscopy which showed diverticulosis of descending colon and mid diverticulosis of ascending and transverse colon, no active bleeding.  A repeat colonoscopy on the 23rd revealed blood in the transverse colon, unable to identify the source of bleeding and a clip was placed for marking.  His h&h last night was 10.6/30.4 which dropped to 8.6/24.9 this morning.  The patient denies further episodes of hematochezia or melena  Since yesterday around 3PM which was black and tarry in the morning and BRB in the afternoon.  He has been off effient and ASA since admission.  He complains of fatigue.  He denies shortness of breath, chest pains or palpitations.    Past Medical History  Diagnosis Date  . Hemorrhoids   . Stented coronary artery   . Diabetes   . Hypertension   . Diverticulosis   . Coronary artery disease     3 stents  . Myocardial infarction   . Heart murmur   . GERD (gastroesophageal reflux disease)     Past Surgical History  Procedure Laterality Date  . Colon resection      12 inches taken out in 2011 at [facility] for diverticulosis  . Hernia repair    . Colonoscopy N/A  10/18/2012    Procedure: COLONOSCOPY;  Surgeon: Louis Meckel, MD;  Location: [facility] ENDOSCOPY;  Service: Endoscopy;  Laterality: N/A;  . Esophagogastroduodenoscopy N/A 10/18/2012    Procedure: ESOPHAGOGASTRODUODENOSCOPY (EGD);  Surgeon: Louis Meckel, MD;  Location: [facility] ENDOSCOPY;  Service: Endoscopy;  Laterality: N/A;  . Colonoscopy N/A 10/22/2012    Procedure: COLONOSCOPY;  Surgeon: Iva Boop, MD;  Location: [facility] ENDOSCOPY;  Service: Endoscopy;  Laterality: N/A;    Family History  Problem Relation Age of Onset  . Heart attack      Social History:  reports that he quit smoking about 10 years ago. His smoking use included Cigarettes. He smoked 0.00 packs per day. He does not have any smokeless tobacco history on file. He reports that  drinks alcohol. He reports that he does not use illicit drugs.  Allergies: No Known Allergies  Medications: {medication reviewed  Results for orders placed during the hospital encounter of 10/15/12 (from the past 48 hour(s))  GLUCOSE, CAPILLARY     Status: Abnormal   Collection Time    10/21/12  5:28 PM      Result Value Range   Glucose-Capillary 166 (*) 70 - 99 mg/dL  CBC     Status: Abnormal   Collection Time    10/21/12  8:09 PM      Result Value Range   WBC 10.9 (*) 4.0 - 10.5 K/uL   RBC 2.68 (*)  4.22 - 5.81 MIL/uL   Hemoglobin 8.1 (*) 13.0 - 17.0 g/dL   Comment: POST TRANSFUSION SPECIMEN     REPEATED TO VERIFY   HCT 22.9 (*) 39.0 - 52.0 %   MCV 85.4  78.0 - 100.0 fL   MCH 30.2  26.0 - 34.0 pg   MCHC 35.4  30.0 - 36.0 g/dL   RDW 78.2  95.6 - 21.3 %   Platelets 245  150 - 400 K/uL  GLUCOSE, CAPILLARY     Status: Abnormal   Collection Time    10/21/12  9:14 PM      Result Value Range   Glucose-Capillary 176 (*) 70 - 99 mg/dL   Comment 1 Notify RN     Comment 2 Documented in Chart    CBC     Status: Abnormal   Collection Time    10/22/12  5:40 AM      Result Value Range   WBC 10.0  4.0 - 10.5 K/uL   RBC 2.17 (*) 4.22 - 5.81 MIL/uL    Hemoglobin 6.4 (*) 13.0 - 17.0 g/dL   Comment: DELTA CHECK NOTED     REPEATED TO VERIFY     CRITICAL RESULT CALLED TO, READ BACK BY AND VERIFIED WITH:     A. MOHAMMOD(RN) 0700 10/22/2012 L. LOMAX   HCT 18.5 (*) 39.0 - 52.0 %   MCV 85.3  78.0 - 100.0 fL   MCH 29.5  26.0 - 34.0 pg   MCHC 34.6  30.0 - 36.0 g/dL   RDW 08.6  57.8 - 46.9 %   Platelets 231  150 - 400 K/uL  PREPARE RBC (CROSSMATCH)     Status: None   Collection Time    10/22/12  7:11 AM      Result Value Range   Order Confirmation ORDER PROCESSED BY BLOOD BANK    GLUCOSE, CAPILLARY     Status: Abnormal   Collection Time    10/22/12  8:33 AM      Result Value Range   Glucose-Capillary 154 (*) 70 - 99 mg/dL   Comment 1 Documented in Chart     Comment 2 Notify RN    GLUCOSE, CAPILLARY     Status: Abnormal   Collection Time    10/22/12 12:27 PM      Result Value Range   Glucose-Capillary 157 (*) 70 - 99 mg/dL   Comment 1 Documented in Chart     Comment 2 Notify RN    GLUCOSE, CAPILLARY     Status: Abnormal   Collection Time    10/22/12  6:19 PM      Result Value Range   Glucose-Capillary 152 (*) 70 - 99 mg/dL  CBC     Status: Abnormal   Collection Time    10/22/12  7:06 PM      Result Value Range   WBC 12.7 (*) 4.0 - 10.5 K/uL   RBC 3.54 (*) 4.22 - 5.81 MIL/uL   Hemoglobin 10.6 (*) 13.0 - 17.0 g/dL   Comment: POST TRANSFUSION SPECIMEN   HCT 30.4 (*) 39.0 - 52.0 %   MCV 85.9  78.0 - 100.0 fL   MCH 29.9  26.0 - 34.0 pg   MCHC 34.9  30.0 - 36.0 g/dL   RDW 62.9  52.8 - 41.3 %   Platelets 261  150 - 400 K/uL  GLUCOSE, CAPILLARY     Status: Abnormal   Collection Time    10/22/12 10:38 PM  Result Value Range   Glucose-Capillary 183 (*) 70 - 99 mg/dL  URINALYSIS W MICROSCOPIC + REFLEX CULTURE     Status: Abnormal   Collection Time    10/23/12  2:49 AM      Result Value Range   Color, Urine YELLOW  YELLOW   APPearance CLOUDY (*) CLEAR   Specific Gravity, Urine 1.026  1.005 - 1.030   pH 5.5  5.0 - 8.0    Glucose, UA NEGATIVE  NEGATIVE mg/dL   Hgb urine dipstick NEGATIVE  NEGATIVE   Bilirubin Urine NEGATIVE  NEGATIVE   Ketones, ur NEGATIVE  NEGATIVE mg/dL   Protein, ur NEGATIVE  NEGATIVE mg/dL   Urobilinogen, UA 0.2  0.0 - 1.0 mg/dL   Nitrite NEGATIVE  NEGATIVE   Leukocytes, UA SMALL (*) NEGATIVE   WBC, UA 3-6  <3 WBC/hpf   Bacteria, UA RARE  RARE   Squamous Epithelial / LPF MANY (*) RARE  LACTATE DEHYDROGENASE     Status: None   Collection Time    10/23/12  3:35 AM      Result Value Range   LDH 222  94 - 250 U/L   Comment: SLIGHT HEMOLYSIS  TRANSFUSION REACTION     Status: None   Collection Time    10/23/12  4:25 AM      Result Value Range   Post RXN DAT IgG NEG     DAT C3 NEG     Path interp tx rxn PENDING    CBC     Status: Abnormal   Collection Time    10/23/12  5:30 AM      Result Value Range   WBC 13.6 (*) 4.0 - 10.5 K/uL   RBC 2.93 (*) 4.22 - 5.81 MIL/uL   Hemoglobin 8.6 (*) 13.0 - 17.0 g/dL   Comment: REPEATED TO VERIFY     RESULTS VERIFIED VIA RECOLLECT   HCT 24.9 (*) 39.0 - 52.0 %   MCV 85.0  78.0 - 100.0 fL   MCH 29.4  26.0 - 34.0 pg   MCHC 34.5  30.0 - 36.0 g/dL   RDW 82.9  56.2 - 13.0 %   Platelets 221  150 - 400 K/uL  GLUCOSE, CAPILLARY     Status: Abnormal   Collection Time    10/23/12  7:56 AM      Result Value Range   Glucose-Capillary 171 (*) 70 - 99 mg/dL   Comment 1 Documented in Chart     Comment 2 Notify RN    GLUCOSE, CAPILLARY     Status: Abnormal   Collection Time    10/23/12 12:40 PM      Result Value Range   Glucose-Capillary 156 (*) 70 - 99 mg/dL   Comment 1 Documented in Chart     Comment 2 Notify RN    HEMOGLOBIN AND HEMATOCRIT, BLOOD     Status: Abnormal   Collection Time    10/23/12 12:50 PM      Result Value Range   Hemoglobin 8.6 (*) 13.0 - 17.0 g/dL   HCT 86.5 (*) 78.4 - 69.6 %    Dg Chest 2 View  10/23/2012   *RADIOLOGY REPORT*  Clinical Data: Fever post colonoscopy portable chest x-ray I have  CHEST - 2 VIEW   Comparison: Portable chest x-ray of 10/16/2012  Findings: No active infiltrate or effusion is seen.  No pneumothorax is noted.  Mediastinal contours appear stable.  The heart is within normal limits in size.  There  is no evidence of free intraperitoneal air.  IMPRESSION:  1.  No active lung disease. 2.  No pneumothorax.  No free air.   Original Report Authenticated By: Dwyane Dee, M.D.   Dg Abd 1 View  10/22/2012   *RADIOLOGY REPORT*  Clinical Data: Metal clips placed at colonoscopy today, 60 - 70 cm from the anus.  Query distal transverse.  ABDOMEN - 1 VIEW  Comparison: CT abdomen and pelvis 07/22/2009  Findings: Surgical clips are demonstrated in the upper abdomen and are projected over the transverse colon at the hepatic flexure and just to the right of midline at the level of L2.  Scattered gas in the colon and small bowel.  This is likely post procedural.  No radiopaque stones.  Mild degenerative changes in the lumbar spine and hips.  IMPRESSION: Surgical clips placed in the transverse colon at the hepatic flexure and mid transverse region.  Nonobstructive bowel gas pattern.   Original Report Authenticated By: Burman Nieves, M.D.    Review of Systems  Constitutional: Positive for fever and chills. Negative for weight loss.  Eyes: Negative for blurred vision, double vision and photophobia.  Respiratory: Negative for cough, sputum production, shortness of breath and wheezing.   Cardiovascular: Negative for chest pain and palpitations.  Gastrointestinal: Negative for nausea, vomiting, abdominal pain, diarrhea, constipation, blood in stool and melena.  Genitourinary: Negative for dysuria and urgency.       Scrotal swelling  Musculoskeletal: Negative for joint pain.  Neurological: Negative for dizziness, speech change, focal weakness, seizures, loss of consciousness and headaches.  Psychiatric/Behavioral: Negative for memory loss. The patient is not nervous/anxious.    Blood pressure 107/73,  pulse 107, temperature 101.4 F (38.6 C), temperature source Oral, resp. rate 20, height 5\' 7"  (1.702 m), weight 204 lb 5.9 oz (92.7 kg), SpO2 100.00%. Physical Exam  Constitutional: He is oriented to person, place, and time. He appears well-developed and well-nourished. No distress.  HENT:  Head: Normocephalic and atraumatic.  Eyes: Right eye exhibits no discharge. Left eye exhibits no discharge. No scleral icterus.  Neck: Normal range of motion. Neck supple.  Cardiovascular: Normal rate, regular rhythm, normal heart sounds and intact distal pulses.  Exam reveals no gallop and no friction rub.   No murmur heard. Respiratory: Effort normal and breath sounds normal. No respiratory distress. He has no wheezes. He has no rales. He exhibits no tenderness.  GI: Soft. Bowel sounds are normal. He exhibits no distension and no mass. There is no tenderness. There is no rebound and no guarding.  Genitourinary:  Scrotal edema   Musculoskeletal: He exhibits no edema and no tenderness.  Lymphadenopathy:    He has no cervical adenopathy.  Neurological: He is alert and oriented to person, place, and time.  Skin: Skin is warm and dry. No rash noted. He is not diaphoretic. No erythema. No pallor.  Psychiatric: He has a normal mood and affect. His behavior is normal. Judgment and thought content normal.    Assessment/Plan: Lower GI bleeding: secondary to Effient which has been stopped since the 16th.  He is hemodynamically stable.  I have repeated the hemoglobin and hematocrit which are stable.  He has not had any bloody BMs since yesterday.  Furthermore, the colonoscopy did not localize the source of bleeding and no active bleeding was identified.  Therefore, there are no indications for surgical intervention.  Should he have further bleeding we recommend a nuclear medicine bleeding scan, arteriogram or repeat colonoscopy to definitively localize the  source of bleeding.  Would also recommend clear liquid diet  in case urgent surgery is required.  Continue with IV hydration.  Continue to hold Effient.  Continue to monitor CBC.  We discussed the plan with the patient, wife and sister.  We advised them to let us know if he starts to bleed again. Thank you for allowing [place] to be a part of your care.  We will continue to follow.  FUO Defer to internal medicine  Bonner Puna [facility] ANP-BC Pager [phone]  10/23/2012, 1:36 PM

## 2012-10-24 ENCOUNTER — Inpatient Hospital Stay (HOSPITAL_COMMUNITY): Payer: Non-veteran care

## 2012-10-24 LAB — COMPREHENSIVE METABOLIC PANEL
ALT: 10 U/L (ref 0–53)
AST: 15 U/L (ref 0–37)
Albumin: 2.4 g/dL — ABNORMAL LOW (ref 3.5–5.2)
CO2: 22 mEq/L (ref 19–32)
Calcium: 8.3 mg/dL — ABNORMAL LOW (ref 8.4–10.5)
Creatinine, Ser: 0.98 mg/dL (ref 0.50–1.35)
GFR calc non Af Amer: >90 mL/min (ref >90)
Sodium: 134 mEq/L — ABNORMAL LOW (ref 135–145)
Total Protein: 5.9 g/dL — ABNORMAL LOW (ref 6.0–8.3)

## 2012-10-24 LAB — CBC
HCT: 19.4 % — ABNORMAL LOW (ref 39.0–52.0)
Hemoglobin: 6.9 g/dL — CL (ref 13.0–17.0)
Hemoglobin: 6.7 g/dL — CL (ref 13.0–17.0)
MCH: 30.3 pg (ref 26.0–34.0)
MCH: 29.6 pg (ref 26.0–34.0)
MCHC: 35.6 g/dL (ref 30.0–36.0)
Platelets: 192 10*3/uL (ref 150–400)
Platelets: 216 10*3/uL (ref 150–400)
RBC: 2.26 MIL/uL — ABNORMAL LOW (ref 4.22–5.81)
RDW: 14.7 % (ref 11.5–15.5)
RDW: 14.6 % (ref 11.5–15.5)
WBC: 13.6 10*3/uL — ABNORMAL HIGH (ref 4.0–10.5)
WBC: 14.1 10*3/uL — ABNORMAL HIGH (ref 4.0–10.5)

## 2012-10-24 LAB — GLUCOSE, CAPILLARY
Glucose-Capillary: 166 mg/dL — ABNORMAL HIGH (ref 70–99)
Glucose-Capillary: 144 mg/dL — ABNORMAL HIGH (ref 70–99)
Glucose-Capillary: 107 mg/dL — ABNORMAL HIGH (ref 70–99)

## 2012-10-24 LAB — URINE CULTURE: Colony Count: 10000

## 2012-10-24 MED ORDER — TECHNETIUM TC 99M-LABELED RED BLOOD CELLS IV KIT
25.0000 | PACK | Freq: Once | INTRAVENOUS | Status: AC | PRN
  Administered 2012-10-24: 25 via INTRAVENOUS

## 2012-10-24 NOTE — Progress Notes (Signed)
I have seen the patient and reviewed the daily progress note by Heywood Iles MS4 and discussed the care of the patient with them.  See below for documentation of my findings, assessment, and plans.  Subjective: The patient spiked a temperature to 102.9 overnight.  The patient notes no dyspnea, LE pain, LE edema, cough, dysuria.  This morning, the patient's Hb dropped from 6.9 to 8.5.  The patient was transfused 1 U pRBC's.  Objective: Vital signs in last 24 hours: Filed Vitals:   10/24/12 0051 10/24/12 0156 10/24/12 0500 10/24/12 1002  BP:   118/69 126/72  Pulse:   111 112  Temp: 101.5 F (38.6 C) 99.5 F (37.5 C) 99.9 F (37.7 C) 100 F (37.8 C)  TempSrc: Oral Oral Oral Oral  Resp:   20 18  Height:      Weight:   198 lb 6.6 oz (90 kg)   SpO2:   99% 99%   Weight change:   Intake/Output Summary (Last 24 hours) at 10/24/12 1531 Last data filed at 10/24/12 0700  Gross per 24 hour  Intake 1212.5 ml  Output    650 ml  Net  562.5 ml  General: alert, cooperative, and in no apparent distress HEENT: pupils equal round and reactive to light, vision grossly intact, oropharynx clear and non-erythematous  Neck: supple, no lymphadenopathy Lungs: clear to ascultation bilaterally, normal work of respiration, no wheezes, rales, ronchi Heart: regular rate and rhythm, grade II/VI early systolic murmur best heard at LUSB Abdomen: soft, non-tender, non-distended, normal bowel sounds  Extremities: no cyanosis, clubbing, or edema Neurologic: alert & oriented X3, cranial nerves II-XII intact, strength grossly intact, sensation intact to light touch  Lab Results: Reviewed and documented in Electronic Record Micro Results: Reviewed and documented in Electronic Record Studies/Results: Reviewed and documented in Electronic Record Medications: I have reviewed the patient's current medications. Scheduled Meds: . insulin aspart  0-15 Units Subcutaneous TID WC  . insulin glargine  25 Units  Subcutaneous QHS  . pantoprazole  40 mg Oral QHS   Continuous Infusions: . sodium chloride 75 mL/hr at 10/24/12 0546   PRN Meds:.acetaminophen, hydrocortisone cream Assessment/Plan: The patient is a 52 yo man, history of CAD, HTN, prior diverticulosis s/p partial colectomy, presenting with a diverticular bleed.   # Acute blood loss anemia/Diverticulosis - The patient presented with painless blood per rectum, likely representing diverticular bleed. Continued drop in Hb. The patient's family would like to discuss the possibility of surgery.  -continue to follow CBC q12 hours  -transfused 1 U pRBC's this AM -will hold off on effient or aspirin (see CAD below)  -GI following, greatly appreciate recs  -Surgery following, greatly appreciate recs -will obtain repeat tagged red blood cell scan today -continue protonix   # Fever - patient began having daily fevers on 7/23. CXR, blood cultures, and urine culture have been unrevealing so far. Labs for transfusion reaction were negative. Patient is asymptomatic. Differential includes febrile non-hemolytic transfusion reaction vs bacterial infection (?TTBI) vs VTE. For now, with no source identified and patient hemodynamically stable without symptoms, we will hold antibiotics. If patient becomes unstable, can start broad spectrum antibiotics (ie vanc and zosyn), since the source is unknown.  -blood cultures show no growth to date -urine culture showed 10K colonies with multiple morphotypes, none predominant -tylenol prn for fever  -may need LE doppler vs CTA chest if fevers persist  # CAD - The patient has a history of CAD, with 3 prior stents, most  recently 2012 (DES), at [facility]. Patient presented on dual antiplatelet therapy, but after reviewing guidelines and discussing with the patient's PCP, we decided to discharge the patient home on aspirin monotherapy. Holding all anticoagulants now in the setting of GI bleed.  -hold aspirin until  Hb stabilizes  -recommend restarting aspirin monotherapy at discharge   # DM - history of DM, diagnosed in 2004. Pt takes home Lantus 51 units daily. CBG's have been well-controlled this hospitalization  -continue Lantus 25, with moderate SSI  -uptitrate Lantus towards home dose as PO intake improves   # HTN - History of HTN, on home lisinopril, atneolol, chlorthalidone  -holding antihypertensives in the setting of acute blood loss anemia. Can re-add as tolerated   Dispo: Disposition is deferred at this time, awaiting improvement of current medical problems.  The patient does have a current PCP (Dr. Hazle Nordmann, [facility]) and does not need an [facility] hospital follow-up appointment after discharge  .Services Needed at time of discharge: Y = Yes, Blank = No PT:   OT:   RN:   Equipment:   Other:     LOS: 9 days   Linward Headland, MD 10/24/2012, 3:31 PM

## 2012-10-24 NOTE — Progress Notes (Addendum)
     [facility] Daily Rounding Note 10/24/2012, 8:40 AM  SUBJECTIVE:       No stools, no bleeding.  Not dizzy not SOB.  Feels well.  Frustrated by protracted stay  OBJECTIVE:         Vital signs in last 24 hours:    Temp:  [99.5 F (37.5 C)-102.9 F (39.4 C)] 99.9 F (37.7 C) (07/25 0500) Pulse Rate:  [105-124] 111 (07/25 0500) Resp:  [18-20] 20 (07/25 0500) BP: (112-124)/(69-74) 118/69 mmHg (07/25 0500) SpO2:  [99 %-100 %] 99 % (07/25 0500) Weight:  [90 kg (198 lb 6.6 oz)] 90 kg (198 lb 6.6 oz) (07/25 0500) Last BM Date: 10/22/12 General: pleasant, comfortable  Looks well   Heart: RRR Chest: clear bil Abdomen: soft, NT, ND.  Active BS, no pain.  Extremities: no CCE.  No PAS hose in place Neuro/Psych:  Pleasant, cooperative. Relaxed.    Lab Results:  Recent Labs  10/23/12 0530 10/23/12 1250 10/23/12 1422 10/24/12 0600  WBC 13.6*  --  13.2* 13.6*  HGB 8.6* 8.6* 8.5* 6.9*  HCT 24.9* 24.8* 24.2* 19.4*  PLT 221  --  233 195    Studies/Results:  ASSESMENT: *  Diverticular bleed.  No stools/bleeding in > 24 hours. *  ABL anemia.  Hgb down again, despite not actively bleeding. Not symptomatic *  Unexplained fever. ? Delayed transfusion reaction? WBCs have also risen in conjunction with fever.    PLAN: *  Will need more blood transfused if repeat CBC still showing the lower Hgb *  Resident will be contacting IR to see what they recommend as to nuclear scan vs targeted angiogram.   *  Pt is NPO per surgical PA order this AM *  PAS hose and encouraged ambulation *  restart solid diet as soon as possible.    LOS: 9 days   Jennye Moccasin  10/24/2012, 8:40 AM Pager: [phone]  Hgb decreased but no hematochezia If NM GI bleeding scan is unremarkable and fever persists I would do CT abd/pelvis as fever started after I scoped him - though if a complication from that would expect abdominal pain.  Iva Boop, MD, [facility] [phone]  (pager) 10/24/2012 3:14 PM   Tagged cell scan is negative. Will allow food. Check CMET - recheck lytes and check bili ? If he has had some hemolyisis If remains febrile would check CT  Dr. Loreta Ave on call for [place] this weekend and will check

## 2012-10-24 NOTE — Progress Notes (Signed)
2 Days Post-Op  Subjective: Pt resting comfortably in bed.  Denies bowel movements since the 7/23.  Denies shortness of breath, chest pains or palpitations.  Denies abdominal pain.  Objective: Vital signs in last 24 hours: Temp:  [99.5 F (37.5 C)-102.9 F (39.4 C)] 99.9 F (37.7 C) (07/25 0500) Pulse Rate:  [105-124] 111 (07/25 0500) Resp:  [18-20] 20 (07/25 0500) BP: (112-124)/(69-74) 118/69 mmHg (07/25 0500) SpO2:  [99 %-100 %] 99 % (07/25 0500) Weight:  [198 lb 6.6 oz (90 kg)] 198 lb 6.6 oz (90 kg) (07/25 0500) Last BM Date: 10/22/12  Intake/Output from previous day: 07/24 0701 - 07/25 0700 In: 997.5 [P.O.:520; I.V.:477.5] Out: 650 [Urine:650] Intake/Output this shift:    General appearance: alert, cooperative, appears stated age and no distress Resp: clear to auscultation bilaterally Cardio: regular rate and rhythm, S1, S2 normal, no murmur, click, rub or gallop GI: soft, non-tender; bowel sounds normal; no masses,  no organomegaly Extremities: extremities normal, atraumatic, no cyanosis or edema  Lab Results:   Recent Labs  10/23/12 1422 10/24/12 0600  WBC 13.2* 13.6*  HGB 8.5* 6.9*  HCT 24.2* 19.4*  PLT 233 195   Studies/Results: Dg Chest 2 View  10/23/2012   *RADIOLOGY REPORT*  Clinical Data: Fever post colonoscopy portable chest x-ray I have  CHEST - 2 VIEW  Comparison: Portable chest x-ray of 10/16/2012  Findings: No active infiltrate or effusion is seen.  No pneumothorax is noted.  Mediastinal contours appear stable.  The heart is within normal limits in size.  There is no evidence of free intraperitoneal air.  IMPRESSION:  1.  No active lung disease. 2.  No pneumothorax.  No free air.   Original Report Authenticated By: Dwyane Dee, M.D.   Dg Abd 1 View  10/22/2012   *RADIOLOGY REPORT*  Clinical Data: Metal clips placed at colonoscopy today, 60 - 70 cm from the anus.  Query distal transverse.  ABDOMEN - 1 VIEW  Comparison: CT abdomen and pelvis 07/22/2009   Findings: Surgical clips are demonstrated in the upper abdomen and are projected over the transverse colon at the hepatic flexure and just to the right of midline at the level of L2.  Scattered gas in the colon and small bowel.  This is likely post procedural.  No radiopaque stones.  Mild degenerative changes in the lumbar spine and hips.  IMPRESSION: Surgical clips placed in the transverse colon at the hepatic flexure and mid transverse region.  Nonobstructive bowel gas pattern.   Original Report Authenticated By: Burman Nieves, M.D.    Anti-infectives: Anti-infectives   None      Assessment/Plan: Lower GI bleed H&h dropped down to 6.9/19.4.  He denies any blood stools.  He is hemodynamically stable.  I have called GI regarding further management; repeat colonoscopy or notifying IR to proceed with arteriogram.  Repeat h&h at 12PM.  Transfuse per primary team, I spoke with Dr. Allena Katz and is discussing with his attending.  Further recommendations to follow following discussion with GI and Dr. Magnus Ivan.     LOS: 9 days   Bonner Puna [facility] ANP-BC Pager [phone]  10/24/2012 7:59 AM

## 2012-10-24 NOTE — Progress Notes (Signed)
I have seen and examined the patient and agree with the assessment and plans. He is currently hemodynamically stable. I am afraid just doing a limited transverse colectomy would potentially miss the site of bleeding so I recommend either repeat endo or bleeding scan/a-gram to see if area becomes evident.  If he becomes unstable, would have to do subtotal colectomy.  Sonal Dorwart A. Magnus Ivan  MD, FACS

## 2012-10-24 NOTE — Plan of Care (Signed)
Problem: Phase III Progression Outcomes Goal: H&H stablized <1gm drop in 24 hrs, no active bleeding Outcome: Not Progressing Today HGB was 6.9

## 2012-10-24 NOTE — Progress Notes (Signed)
Internal Medicine Attending  Date: 10/24/2012  Patient name: Antonio [name] Medical record number: [id number] Date of birth: Dec 23, 1960 Age: 52 y.o. Gender: male  I saw and evaluated the patient. I reviewed the resident's note by Dr. Manson Passey and I agree with the resident's findings and plans as documented in his progress note.  [name] had another bloody BM this AM and a drop in his Hgb to < 7.0.  A tagged red blood cell scan this afternoon was negative.  He has been afebrile since 1 AM.  Appreciate GI and General Surgery's recommendations and support.  We will continue observation, obtain an abdominal CT scan if he were to spike a fever again, and transfuse with 1 unit of PRBC to keep Hgb near 8.0.  If he were to bleed again would consider re-endoscopy Vs another tagged red blood cell scan in an attempt to better localize the site of the bleed.

## 2012-10-24 NOTE — Progress Notes (Signed)
CRITICAL VALUE ALERT  Critical value received: HGB 6.9  Date of notification:  10/24/12  Time of notification: 0730  Critical value read back:yes  Nurse who received alert: Nena Polio RN  MD notified (1st page):  Allena Katz  Time of first page:  [phone]  MD notified (2nd page):  Time of second page:  Responding MD: Allena Katz  Time MD responded:  [phone]

## 2012-10-24 NOTE — Progress Notes (Signed)
Subjective: Bloody BM x1 this AM but no associated chest pain, diaphoresis, dizziness, lightheadedness.   Hb 6.9 this AM with repeat at 6.7. Ordered pRBC x1 & tagged RBC scan upon speaking to surgery and GI.   Surgery came by and spoke to patient and family yesterday. Does not recommend surgical intervention at this time and will continue following Hb/Hct.   Continued to have fevers (Tmax 102.8F) through yesterday and last night which responds to Tylenol (last dose at midnight). Repeat direct/indirect Coombs negative as well as urine & blood cultures. Reticulocyte index 6% indicates appropriate bone marrow response in absence of hemolysis.   Objective: Vital signs in last 24 hours: Filed Vitals:   10/23/12 2308 10/24/12 0051 10/24/12 0156 10/24/12 0500  BP:    118/69  Pulse:    111  Temp: 101.4 F (38.6 C) 101.5 F (38.6 C) 99.5 F (37.5 C) 99.9 F (37.7 C)  TempSrc: Oral Oral Oral Oral  Resp:    20  Height:      Weight:    90 kg (198 lb 6.6 oz)  SpO2:    99%   Physical Exam General: alert, cooperative, and in no apparent distress HEENT: pupils equal round and reactive to light, vision grossly intact, oropharynx clear and non-erythematous  Heart: regular rate and rhythm, early systolic murmur grade II/VI at LUSB  Abdomen: soft, non-tender, non-distended, normal bowel sounds Extremities: 2+ DP/PT pulses bilaterally, no cyanosis, clubbing, or edema Neurologic: alert & oriented X3, cranial nerves II-XII intact, strength grossly intact, sensation intact to light touch  Lab Results: CBC    Component Value Date/Time   WBC 14.5* 10/24/2012 0832   RBC 2.21* 10/24/2012 0832   HGB 6.7* 10/24/2012 0832   HCT 18.8* 10/24/2012 0832   PLT 192 10/24/2012 0832   MCV 85.1 10/24/2012 0832   MCH 30.3 10/24/2012 0832   MCHC 35.6 10/24/2012 0832   RDW 14.7 10/24/2012 0832   LYMPHSABS 2.4 10/19/2012 0515   MONOABS 0.8 10/19/2012 0515   EOSABS 0.1 10/19/2012 0515   BASOSABS 0.0 10/19/2012 0515    Medications: I have reviewed the patient's current medications. Scheduled Meds: . insulin aspart  0-15 Units Subcutaneous TID WC  . insulin glargine  25 Units Subcutaneous QHS  . pantoprazole  40 mg Oral QHS   PRN Meds:.acetaminophen, hydrocortisone cream  Assessment/Plan: 52 year old male veteran w/ diverticulosis s/p bowel resection, anemia, CAD s/p stent placements, DM2 initially admitted to ICU for hypotension from acute lower GI bleed likely 2/2 diverticular bleeding in the transverse colon and medication now floor status with resolving fever and decreasing Hb.  #Fever: Likely delayed non-hemolytic reaction to possible contaminants in the transfusion products. Multiple transfusions over hospital course make reaction likely along with WBC trending higher (14.5 vs. 8.6). Blood and urine cultures negative x1 but continue watching. -Tylenol prn for fever   #Decreasing Hb 2/2 transverse colon bleeding: pRBCs x8 since admission with one unit pending today. -Check tagged RBC scan -Per surgery; will follow Hb/Hct and offer recommendations as well as checking CBC q12h -Advised patient to monitor for bloody BM and symptoms. -Continue pantoprazole for GI prophylaxis.  -GI & surgery following and appreciate recs  #CAD s/p stent placements: PCP in agreement to continue patient on ASA monotherapy pending discharge.  #DM2: Continue CBG TID.   #Disposition: Pending improvement in health but possibly in 2-3 days; will need to have stable Hb for at least 48 hours.  This is a Psychologist, occupational Note.  The care  of the patient was discussed with Dr. Janalyn Harder and the assessment and plan formulated with their assistance.  Please see their attached note for official documentation of the daily encounter.   LOS: 9 days   Beather Arbour, Med Student 10/24/2012, 6:37 AM

## 2012-10-25 LAB — CBC
HCT: 21.8 % — ABNORMAL LOW (ref 39.0–52.0)
Hemoglobin: 7.7 g/dL — ABNORMAL LOW (ref 13.0–17.0)
MCH: 29.4 pg (ref 26.0–34.0)
MCHC: 35.3 g/dL (ref 30.0–36.0)
MCV: 83.2 fL (ref 78.0–100.0)
MCV: 83.4 fL (ref 78.0–100.0)
Platelets: 214 10*3/uL (ref 150–400)
RBC: 2.62 MIL/uL — ABNORMAL LOW (ref 4.22–5.81)
RBC: 2.59 MIL/uL — ABNORMAL LOW (ref 4.22–5.81)
WBC: 12 10*3/uL — ABNORMAL HIGH (ref 4.0–10.5)

## 2012-10-25 LAB — GLUCOSE, CAPILLARY: Glucose-Capillary: 171 mg/dL — ABNORMAL HIGH (ref 70–99)

## 2012-10-25 LAB — SURGICAL PCR SCREEN: MRSA, PCR: NEGATIVE

## 2012-10-25 MED ORDER — PIPERACILLIN-TAZOBACTAM 3.375 G IVPB
3.3750 g | Freq: Three times a day (TID) | INTRAVENOUS | Status: DC
  Administered 2012-10-25 – 2012-10-28 (×11): 3.375 g via INTRAVENOUS
  Filled 2012-10-25 (×13): qty 50

## 2012-10-25 MED ORDER — HYDROCODONE-ACETAMINOPHEN 5-325 MG PO TABS
1.0000 | ORAL_TABLET | ORAL | Status: DC | PRN
  Administered 2012-10-25 – 2012-10-28 (×10): 1 via ORAL
  Filled 2012-10-25 (×11): qty 1

## 2012-10-25 MED ORDER — POTASSIUM CHLORIDE CRYS ER 20 MEQ PO TBCR
40.0000 meq | EXTENDED_RELEASE_TABLET | Freq: Once | ORAL | Status: AC
  Administered 2012-10-25: 40 meq via ORAL
  Filled 2012-10-25: qty 2

## 2012-10-25 NOTE — Progress Notes (Signed)
3 Days Post-Op  Subjective: Complaints of perineal pain and drainage  Bleeding scan yesterday negative Having only dark bm's but no bright blood.  Objective: Vital signs in last 24 hours: Temp:  [98.5 F (36.9 C)-100.6 F (38.1 C)] 99.3 F (37.4 C) (07/26 0917) Pulse Rate:  [95-117] 106 (07/26 0756) Resp:  [18] 18 (07/26 0756) BP: (113-130)/(67-82) 117/77 mmHg (07/26 0756) SpO2:  [98 %-100 %] 100 % (07/26 0640) Weight:  [198 lb 3.1 oz (89.9 kg)] 198 lb 3.1 oz (89.9 kg) (07/26 0715) Last BM Date: 10/24/12  Intake/Output from previous day: 07/25 0701 - 07/26 0700 In: 2033.8 [P.O.:720; I.V.:1003.8; Blood:310] Out: 400 [Urine:400] Intake/Output this shift:    Abdomen soft Definite perineal abscess has developed  Lab Results:   Recent Labs  10/24/12 0832 10/24/12 1918  WBC 14.5* 14.1*  HGB 6.7* 6.7*  HCT 18.8* 19.2*  PLT 192 216   BMET  Recent Labs  10/24/12 1918  NA 134*  K 3.2*  CL 101  CO2 22  GLUCOSE 164*  BUN 13  CREATININE 0.98  CALCIUM 8.3*   PT/INR No results found for this basename: LABPROT, INR,  in the last 72 hours ABG No results found for this basename: PHART, PCO2, PO2, HCO3,  in the last 72 hours  Studies/Results: Nm Gi Blood Loss  10/24/2012   *RADIOLOGY REPORT*  Clinical Data: Post colonoscopy.  Prior blood per stool  NUCLEAR MEDICINE GASTROINTESTINAL BLEEDING STUDY  Technique:  Sequential abdominal images were obtained following intravenous administration of Tc-59m labeled red blood cells.  Radiopharmaceutical: CURIE ULTRATAG TECHNETIUM TC 21M- LABELED RED BLOOD CELLS IV KIT  Comparison: None.  Findings: There is no endoluminal accumulation of tagged red blood cells within the gastrointestinal tract to suggest active gastrointestinal bleeding.  Physiologic uptake noted within the blood pool and GU tract.  IMPRESSION: No scintigraphic evidence of active gastrointestinal bleeding.   Original Report Authenticated By: Genevive Bi,  M.D.    Anti-infectives: Anti-infectives   None      Assessment/Plan: s/p Procedure(s): COLONOSCOPY (N/A)  GI bleed with source still remaining unclear.  Receiving more PRBC's. Hemodynamically stable.  Only option from surgery would be subtotal colectomy at this point.  Regarding his perineal abscess, this will unfortunately need to be drained in the OR tomorrow.  Too tender to do at the bedside.  Will start IV antibiotics.  Has drained some on own.  LOS: 10 days    Josemaria Brining A 10/25/2012

## 2012-10-25 NOTE — Progress Notes (Signed)
Patient had a large dark bloody clotted/loose stool this am. Patient's wife stated he has had numerous of these types of stools throughout his admission.  He is currently receiving a unit of blood.  Will continue to monitor patient.

## 2012-10-25 NOTE — Progress Notes (Signed)
S: was called that pt was complaining of increased scrotal swelling and wanted to stop the blood transfusion.   O:  Filed Vitals:   10/25/12 0440  BP: 122/82  Pulse: 95  Temp: 98.8 F (37.1 C)  Resp: 18   General: resting in bed, sleeping comfortably HEENT: no scleral icterus Cardiac: RRR, no rubs, murmurs or gallops Pulm: clear to auscultation bilaterally, moving normal volumes of air Abd: soft, nontender, nondistended, BS present Ext: warm and well perfused, no pedal edema, extensive skin evaluation revealed no urticarial rashes or petechiae GU: minimal left testicular swelling, no masses, non tender, non erythematous, negative prehn's, no varicoceles, no LAD Neuro: alert and oriented X3, cranial nerves II-XII grossly intact  A/P:  -reassured pt that scrotal swelling not likely transfusion reaction -this was reaffirmed by blood bank -nurse updated of plan and to call if any other changes   Christen Bame, MD PGY-2 Pgr: [phone]

## 2012-10-25 NOTE — Progress Notes (Signed)
Cross cover [facility] Subjective: Patient seems very depressed. His family is at bedside; very attentive. He has refused his meals and did not eat any lunch. He is scheduled for a drainage of a perineal abscess tomorrow with CCS. He had 2 bloody BM's this morning and the blood was dark and as per the family seemed like old blood. He denies having any abdominal pain but has a lot of perineal discomfort. No nausea, vomiting, fever or chills.   Objective: Vital signs in last 24 hours: Temp:  [98.5 F (36.9 C)-100.6 F (38.1 C)] 99.2 F (37.3 C) (07/26 1425) Pulse Rate:  [95-117] 98 (07/26 1425) Resp:  [16-18] 16 (07/26 1425) BP: (112-130)/(67-82) 112/72 mmHg (07/26 1425) SpO2:  [98 %-100 %] 99 % (07/26 1425) Weight:  [89.9 kg (198 lb 3.1 oz)] 89.9 kg (198 lb 3.1 oz) (07/26 0715) Last BM Date: 10/24/12  Intake/Output from previous day: 07/25 0701 - 07/26 0700 In: 2033.8 [P.O.:720; I.V.:1003.8; Blood:310] Out: 400 [Urine:400] Intake/Output this shift: Total I/O In: 50 [P.O.:50] Out: 375 [Urine:375]  General appearance: alert, cooperative, appears stated age, no distress and moderately obese Resp: clear to auscultation bilaterally Cardio: regular rate and rhythm, S1, S2 normal, no murmur, click, rub or gallop GI: soft, obese, non-tender; bowel sounds normal; no masses,  no organomegaly Extremities: extremities normal, atraumatic, no cyanosis or edema  Lab Results:  Recent Labs  10/24/12 0832 10/24/12 1918 10/25/12 1038  WBC 14.5* 14.1* 12.8*  HGB 6.7* 6.7* 7.7*  HCT 18.8* 19.2* 21.8*  PLT 192 216 219   BMET  Recent Labs  10/24/12 1918  NA 134*  K 3.2*  CL 101  CO2 22  GLUCOSE 164*  BUN 13  CREATININE 0.98  CALCIUM 8.3*   LFT  Recent Labs  10/24/12 1918  PROT 5.9*  ALBUMIN 2.4*  AST 15  ALT 10  ALKPHOS 40  BILITOT 0.5   Studies/Results: Nm Gi Blood Loss  10/24/2012   *RADIOLOGY REPORT*  Clinical Data: Post colonoscopy.  Prior blood per stool  NUCLEAR  MEDICINE GASTROINTESTINAL BLEEDING STUDY  Technique:  Sequential abdominal images were obtained following intravenous administration of Tc-25m labeled red blood cells.  Radiopharmaceutical: CURIE ULTRATAG TECHNETIUM TC 72M- LABELED RED BLOOD CELLS IV KIT  Comparison: None.  Findings: There is no endoluminal accumulation of tagged red blood cells within the gastrointestinal tract to suggest active gastrointestinal bleeding.  Physiologic uptake noted within the blood pool and GU tract.  IMPRESSION: No scintigraphic evidence of active gastrointestinal bleeding.   Original Report Authenticated By: Genevive Bi, M.D.   Medications: I have reviewed the patient's current medications.  Assessment/Plan: 1) LGI bleed/Diverticular bleed/anemia: seems to be stable at this time. Will monitor CBC's closely.  2) Perineal abscess-for drainage tomorrow.  3) Type II DM.  4) CAD.   LOS: 10 days   Shahir Karen 10/25/2012, 5:39 PM

## 2012-10-25 NOTE — Progress Notes (Signed)
Notified MD that patient has a swollen, painful, foul smelling area of  Skin from the scrotal to the anus.

## 2012-10-25 NOTE — Progress Notes (Signed)
Lab notified and, per lab, scrotal swelling is not related to a transfusion reaction.  Dr. Burtis Junes notified of lab/nurse conversation and she stated to restart blood.  With patient permission I will restart blood and continue to monitor patient.

## 2012-10-25 NOTE — Progress Notes (Signed)
Tylenol Given for Temp of 100.3

## 2012-10-25 NOTE — Progress Notes (Signed)
ANTIBIOTIC CONSULT NOTE - INITIAL  Pharmacy Consult for Zosyn Indication: Perineal abscess  No Known Allergies  Patient Measurements: Height: 5\' 7"  (170.2 cm) Weight: 198 lb 3.1 oz (89.9 kg) IBW/kg (Calculated) : 66.1 Adjusted Body Weight: 75.6  Vital Signs: Temp: 99.3 F (37.4 C) (07/26 0917) Temp src: Oral (07/26 0917) BP: 117/77 mmHg (07/26 0756) Pulse Rate: 106 (07/26 0756) Intake/Output from previous day: 07/25 0701 - 07/26 0700 In: 2033.8 [P.O.:720; I.V.:1003.8; Blood:310] Out: 400 [Urine:400] Intake/Output from this shift:    Labs:  Recent Labs  10/24/12 0832 10/24/12 1918 10/25/12 1038  WBC 14.5* 14.1* 12.8*  HGB 6.7* 6.7* 7.7*  PLT 192 216 219  CREATININE  --  0.98  --    Estimated Creatinine Clearance: 94.3 ml/min (by C-G formula based on Cr of 0.98). No results found for this basename: VANCOTROUGH, Leodis Binet, VANCORANDOM, GENTTROUGH, GENTPEAK, GENTRANDOM, TOBRATROUGH, TOBRAPEAK, TOBRARND, AMIKACINPEAK, AMIKACINTROU, AMIKACIN,  in the last 72 hours   Microbiology: Recent Results (from the past 720 hour(s))  MRSA PCR SCREENING     Status: None   Collection Time    10/15/12 11:44 PM      Result Value Range Status   MRSA by PCR NEGATIVE  NEGATIVE Final   Comment:            The GeneXpert MRSA Assay (FDA     approved for NASAL specimens     only), is one component of a     comprehensive MRSA colonization     surveillance program. It is not     intended to diagnose MRSA     infection nor to guide or     monitor treatment for     MRSA infections.  URINE CULTURE     Status: None   Collection Time    10/23/12  2:49 AM      Result Value Range Status   Specimen Description URINE, CLEAN CATCH   Final   Special Requests NONE   Final   Culture  Setup Time 10/23/2012 08:58   Final   Colony Count 10,000 COLONIES/ML   Final   Culture     Final   Value: Multiple bacterial morphotypes present, none predominant. Suggest appropriate recollection if clinically  indicated.   Report Status 10/24/2012 FINAL   Final  CULTURE, BLOOD (ROUTINE X 2)     Status: None   Collection Time    10/23/12  3:25 AM      Result Value Range Status   Specimen Description BLOOD LEFT ARM   Final   Special Requests BOTTLES DRAWN AEROBIC ONLY 2.5CC   Final   Culture  Setup Time 10/23/2012 08:54   Final   Culture     Final   Value:        BLOOD CULTURE RECEIVED NO GROWTH TO DATE CULTURE WILL BE HELD FOR 5 DAYS BEFORE ISSUING A FINAL NEGATIVE REPORT   Report Status PENDING   Incomplete  CULTURE, BLOOD (ROUTINE X 2)     Status: None   Collection Time    10/23/12  3:35 AM      Result Value Range Status   Specimen Description BLOOD RIGHT ARM   Final   Special Requests BOTTLES DRAWN AEROBIC ONLY 5CC   Final   Culture  Setup Time 10/23/2012 08:54   Final   Culture     Final   Value:        BLOOD CULTURE RECEIVED NO GROWTH TO DATE CULTURE WILL BE HELD FOR  5 DAYS BEFORE ISSUING A FINAL NEGATIVE REPORT   Report Status PENDING   Incomplete    Medical History: Past Medical History  Diagnosis Date  . Hemorrhoids   . Stented coronary artery   . Diabetes   . Hypertension   . Diverticulosis   . Coronary artery disease     3 stents  . Myocardial infarction   . Heart murmur   . GERD (gastroesophageal reflux disease)     Medications:  Prescriptions prior to admission  Medication Sig Dispense Refill  . aspirin EC 81 MG tablet Take 81 mg by mouth daily.      Marland Kitchen atenolol-chlorthalidone (TENORETIC) 50-25 MG per tablet Take 1 tablet by mouth daily.      . insulin glargine (LANTUS) 100 UNIT/ML injection Inject 51 Units into the skin at bedtime.      Marland Kitchen lisinopril (PRINIVIL,ZESTRIL) 40 MG tablet Take 40 mg by mouth daily.      . metFORMIN (GLUCOPHAGE) 1000 MG tablet Take 1,000 mg by mouth 2 (two) times daily with a meal.      . pantoprazole (PROTONIX) 40 MG tablet Take 40 mg by mouth daily as needed. For heartburn      . prasugrel (EFFIENT) 10 MG TABS Take 10 mg by mouth daily.        Assessment: [name] admitted 7/16 with diverticular GIB s/p colonoscopy and tagged RBC scan. Pt was improving with no further bleeding and tolerating PO diet until 7/22 PM when pt began re-bleeding. Hgb 8.1 >6.4. Colonoscopy on 7/23 noted moderate diverticulosis. Still having large dark bloody BM's.  Development of perineal abscess noted today. Pharmacy consulted to dose Zosyn. Tmax 100.6, WBC 12.8 down from 14.1 yesterday, renal function stable.  Urine culture grew 10k polymicrobial colonies (none predominant), blood culture pending. Plan to drain in OR tomorrow.   Goal of Therapy:  Infection resolution  Plan:  - Start Zosyn 3.375 g IV q8h (infuse over 4 hrs) - Monitor CBC, renal function, clinical status    Margie Billet, PharmD Clinical Pharmacist - Resident Pager: [phone] Pharmacy: [phone] 10/25/2012 12:55 PM

## 2012-10-25 NOTE — Progress Notes (Signed)
Subjective: The patient was noted to have a dark loose stool this morning.  The patient received 2 U pRBC's overnight for a Hb of 6.7, which has now increased to 7.7.  Tagged RBC scan yesterday revealed no source of bleeding.  The patient notes new perineal pain and swelling.  Objective: Vital signs in last 24 hours: Filed Vitals:   10/25/12 0640 10/25/12 0715 10/25/12 0756 10/25/12 0917  BP: 116/79  117/77   Pulse: 117  106   Temp: 98.8 F (37.1 C)  100.3 F (37.9 C) 99.3 F (37.4 C)  TempSrc: Oral   Oral  Resp: 18  18   Height:      Weight:  198 lb 3.1 oz (89.9 kg)    SpO2: 100%      Weight change:   Intake/Output Summary (Last 24 hours) at 10/25/12 1304 Last data filed at 10/25/12 0600  Gross per 24 hour  Intake 2033.75 ml  Output    400 ml  Net 1633.75 ml  General: alert, cooperative, and in no apparent distress HEENT: pupils equal round and reactive to light, vision grossly intact, oropharynx clear and non-erythematous  Neck: supple, no lymphadenopathy Lungs: clear to ascultation bilaterally, normal work of respiration, no wheezes, rales, ronchi Heart: regular rate and rhythm, grade II/VI early systolic murmur best heard at LUSB Abdomen: soft, non-tender, non-distended, normal bowel sounds  GU: Perineal swelling and tenderness noted, with serosanguinous drainage on bedsheets though without obvious opening from skin Extremities: no cyanosis, clubbing, or edema Neurologic: alert & oriented X3, cranial nerves II-XII intact, strength grossly intact, sensation intact to light touch  Lab Results: Basic Metabolic Panel:  Recent Labs Lab 10/19/12 0515 10/24/12 1918  NA 138 134*  K 3.4* 3.2*  CL 107 101  CO2 23 22  GLUCOSE 139* 164*  BUN 6 13  CREATININE 0.91 0.98  CALCIUM 8.0* 8.3*   Liver Function Tests:  Recent Labs Lab 10/24/12 1918  AST 15  ALT 10  ALKPHOS 40  BILITOT 0.5  PROT 5.9*  ALBUMIN 2.4*   CBC:  Recent Labs Lab 10/19/12 0515   10/24/12 1918 10/25/12 1038  WBC 8.0  < > 14.1* 12.8*  NEUTROABS 4.7  --   --   --   HGB 7.6*  < > 6.7* 7.7*  HCT 22.1*  < > 19.2* 21.8*  MCV 87.0  < > 85.0 83.2  PLT 163  < > 216 219  < > = values in this interval not displayed. CBG:  Recent Labs Lab 10/24/12 0726 10/24/12 1156 10/24/12 1715 10/24/12 2208 10/25/12 0806 10/25/12 1155  GLUCAP 166* 144* 107* 169* 171* 213*   Urinalysis:  Recent Labs Lab 10/23/12 0249  COLORURINE YELLOW  LABSPEC 1.026  PHURINE 5.5  GLUCOSEU NEGATIVE  HGBUR NEGATIVE  BILIRUBINUR NEGATIVE  KETONESUR NEGATIVE  PROTEINUR NEGATIVE  UROBILINOGEN 0.2  NITRITE NEGATIVE  LEUKOCYTESUR SMALL*     Micro Results: Recent Results (from the past 240 hour(s))  MRSA PCR SCREENING     Status: None   Collection Time    10/15/12 11:44 PM      Result Value Range Status   MRSA by PCR NEGATIVE  NEGATIVE Final   Comment:            The GeneXpert MRSA Assay (FDA     approved for NASAL specimens     only), is one component of a     comprehensive MRSA colonization     surveillance program. It is  not     intended to diagnose MRSA     infection nor to guide or     monitor treatment for     MRSA infections.  URINE CULTURE     Status: None   Collection Time    10/23/12  2:49 AM      Result Value Range Status   Specimen Description URINE, CLEAN CATCH   Final   Special Requests NONE   Final   Culture  Setup Time 10/23/2012 08:58   Final   Colony Count 10,000 COLONIES/ML   Final   Culture     Final   Value: Multiple bacterial morphotypes present, none predominant. Suggest appropriate recollection if clinically indicated.   Report Status 10/24/2012 FINAL   Final  CULTURE, BLOOD (ROUTINE X 2)     Status: None   Collection Time    10/23/12  3:25 AM      Result Value Range Status   Specimen Description BLOOD LEFT ARM   Final   Special Requests BOTTLES DRAWN AEROBIC ONLY 2.5CC   Final   Culture  Setup Time 10/23/2012 08:54   Final   Culture     Final    Value:        BLOOD CULTURE RECEIVED NO GROWTH TO DATE CULTURE WILL BE HELD FOR 5 DAYS BEFORE ISSUING A FINAL NEGATIVE REPORT   Report Status PENDING   Incomplete  CULTURE, BLOOD (ROUTINE X 2)     Status: None   Collection Time    10/23/12  3:35 AM      Result Value Range Status   Specimen Description BLOOD RIGHT ARM   Final   Special Requests BOTTLES DRAWN AEROBIC ONLY 5CC   Final   Culture  Setup Time 10/23/2012 08:54   Final   Culture     Final   Value:        BLOOD CULTURE RECEIVED NO GROWTH TO DATE CULTURE WILL BE HELD FOR 5 DAYS BEFORE ISSUING A FINAL NEGATIVE REPORT   Report Status PENDING   Incomplete   Studies/Results: Nm Gi Blood Loss  10/24/2012   *RADIOLOGY REPORT*  Clinical Data: Post colonoscopy.  Prior blood per stool  NUCLEAR MEDICINE GASTROINTESTINAL BLEEDING STUDY  Technique:  Sequential abdominal images were obtained following intravenous administration of Tc-51m labeled red blood cells.  Radiopharmaceutical: CURIE ULTRATAG TECHNETIUM TC 89M- LABELED RED BLOOD CELLS IV KIT  Comparison: None.  Findings: There is no endoluminal accumulation of tagged red blood cells within the gastrointestinal tract to suggest active gastrointestinal bleeding.  Physiologic uptake noted within the blood pool and GU tract.  IMPRESSION: No scintigraphic evidence of active gastrointestinal bleeding.   Original Report Authenticated By: Genevive Bi, M.D.   Medications: I have reviewed the patient's current medications. Scheduled Meds: . insulin aspart  0-15 Units Subcutaneous TID WC  . insulin glargine  25 Units Subcutaneous QHS  . pantoprazole  40 mg Oral QHS   Continuous Infusions: . sodium chloride 75 mL/hr at 10/25/12 1138   PRN Meds:.acetaminophen, HYDROcodone-acetaminophen, hydrocortisone cream Assessment/Plan: The patient is a 52 yo man, history of CAD, HTN, prior diverticulosis s/p partial colectomy, presenting with a diverticular bleed.   # Acute blood loss  anemia/Diverticulosis - The patient presented with painless blood per rectum, likely representing diverticular bleed. Continued drop in Hb. NM scan showed no bleeding 7/18 or 7/25.  Colonoscopy negative 7/19, but showed bleeding without obvious source 7/23.  EGD negative 7/19.  At this point, options include subtotal  colectomy (since the source of bleeding is not apparent) vs continued watchful waiting, repeating tagged RBC scan if pt has bright red blood per rectum (dark blood, as present this morning, likely represents old blood, and NM study likely to be negative). -continue to follow CBC q12 hours  -transfused 2 U pRBC's yesterday -avoid antiplatelet agents -GI following, greatly appreciate recs  -Surgery following, greatly appreciate recs  -continue protonix   # Perineal abscess - Pt notes pain and discharge from perineum.  Patient began having daily fevers on 7/23. CXR, blood cultures, and urine culture negative. -tylenol prn for fever  -plan for I&D per surgery tomorrow -started zosyn per surgery recs  # CAD - The patient has a history of CAD, with 3 prior stents, most recently 2012 (DES), at [facility]. Patient presented on dual antiplatelet therapy, but after reviewing guidelines and discussing with the patient's PCP, we decided to discharge the patient home on aspirin monotherapy. Holding all anticoagulants now in the setting of GI bleed.  -hold aspirin until Hb stabilizes  -recommend restarting aspirin monotherapy at discharge   # DM - history of DM, diagnosed in 2004. Pt takes home Lantus 51 units daily. CBG's have been well-controlled this hospitalization  -continue Lantus 25, with moderate SSI  -uptitrate Lantus towards home dose as PO intake improves   # HTN - History of HTN, on home lisinopril, atneolol, chlorthalidone  -holding antihypertensives in the setting of acute blood loss anemia. Can re-add as tolerated   Dispo: Disposition is deferred at this time, awaiting  improvement of current medical problems.   The patient does have a current PCP (Dr. Hazle Nordmann, [facility]) and does not need an [facility] hospital follow-up appointment after discharge   .Services Needed at time of discharge: Y = Yes, Blank = No PT:   OT:   RN:   Equipment:   Other:     LOS: 10 days   Linward Headland, MD 10/25/2012, 1:04 PM

## 2012-10-25 NOTE — Progress Notes (Signed)
MD notified that blood infusion was stopped due to patient complaint of scrotal swelling.  Upon inspection scrotum and penis appeared to be slightly edematous with no pitting.  MD is to call back with specific instructions.  Will continue to monitor patient.

## 2012-10-26 ENCOUNTER — Inpatient Hospital Stay (HOSPITAL_COMMUNITY): Payer: Non-veteran care | Admitting: Critical Care Medicine

## 2012-10-26 ENCOUNTER — Encounter (HOSPITAL_COMMUNITY)
Admission: EM | Disposition: A | Discharge status: Home Health | Payer: Self-pay | Source: Home / Self Care | Attending: Internal Medicine

## 2012-10-26 ENCOUNTER — Encounter (HOSPITAL_COMMUNITY): Payer: Self-pay | Admitting: Critical Care Medicine

## 2012-10-26 DIAGNOSIS — L02219 Cutaneous abscess of trunk, unspecified: Secondary | ICD-10-CM

## 2012-10-26 DIAGNOSIS — K612 Anorectal abscess: Secondary | ICD-10-CM

## 2012-10-26 DIAGNOSIS — L03319 Cellulitis of trunk, unspecified: Secondary | ICD-10-CM

## 2012-10-26 HISTORY — PX: IRRIGATION AND DEBRIDEMENT ABSCESS: SHX5252

## 2012-10-26 LAB — CBC
HCT: 21.3 % — ABNORMAL LOW (ref 39.0–52.0)
Hemoglobin: 7.4 g/dL — ABNORMAL LOW (ref 13.0–17.0)
Hemoglobin: 7.2 g/dL — ABNORMAL LOW (ref 13.0–17.0)
MCH: 29.8 pg (ref 26.0–34.0)
MCHC: 34.7 g/dL (ref 30.0–36.0)
RBC: 2.42 MIL/uL — ABNORMAL LOW (ref 4.22–5.81)

## 2012-10-26 LAB — BASIC METABOLIC PANEL
CO2: 22 mEq/L (ref 19–32)
Chloride: 101 mEq/L (ref 96–112)
GFR calc non Af Amer: >90 mL/min (ref >90)
Glucose, Bld: 150 mg/dL — ABNORMAL HIGH (ref 70–99)
Potassium: 3.4 mEq/L — ABNORMAL LOW (ref 3.5–5.1)
Sodium: 134 mEq/L — ABNORMAL LOW (ref 135–145)

## 2012-10-26 LAB — GLUCOSE, CAPILLARY
Glucose-Capillary: 131 mg/dL — ABNORMAL HIGH (ref 70–99)
Glucose-Capillary: 128 mg/dL — ABNORMAL HIGH (ref 70–99)
Glucose-Capillary: 107 mg/dL — ABNORMAL HIGH (ref 70–99)

## 2012-10-26 LAB — TYPE AND SCREEN
ABO/RH(D): O POS
Unit division: 0

## 2012-10-26 LAB — PLATELET AB, INDIRECT, IGG

## 2012-10-26 SURGERY — IRRIGATION AND DEBRIDEMENT ABSCESS
Anesthesia: General | Site: Perineum | Wound class: Dirty or Infected

## 2012-10-26 MED ORDER — HYDROMORPHONE HCL PF 1 MG/ML IJ SOLN
INTRAMUSCULAR | Status: AC
  Filled 2012-10-26: qty 1

## 2012-10-26 MED ORDER — SODIUM CHLORIDE 0.9 % IR SOLN
Status: DC | PRN
  Administered 2012-10-26: 3000 mL

## 2012-10-26 MED ORDER — MORPHINE SULFATE 4 MG/ML IJ SOLN
4.0000 mg | INTRAMUSCULAR | Status: DC | PRN
  Administered 2012-10-26 – 2012-10-27 (×4): 4 mg via INTRAVENOUS
  Filled 2012-10-26 (×4): qty 1

## 2012-10-26 MED ORDER — GLYCOPYRROLATE 0.2 MG/ML IJ SOLN
INTRAMUSCULAR | Status: DC | PRN
  Administered 2012-10-26: 0.2 mg via INTRAVENOUS

## 2012-10-26 MED ORDER — ROCURONIUM BROMIDE 100 MG/10ML IV SOLN
INTRAVENOUS | Status: DC | PRN
  Administered 2012-10-26: 20 mg via INTRAVENOUS

## 2012-10-26 MED ORDER — HYDROMORPHONE HCL PF 1 MG/ML IJ SOLN
INTRAMUSCULAR | Status: AC
  Administered 2012-10-26: 1 mg
  Filled 2012-10-26: qty 1

## 2012-10-26 MED ORDER — POTASSIUM CHLORIDE CRYS ER 20 MEQ PO TBCR
40.0000 meq | EXTENDED_RELEASE_TABLET | Freq: Once | ORAL | Status: AC
  Administered 2012-10-26: 40 meq via ORAL
  Filled 2012-10-26 (×2): qty 2

## 2012-10-26 MED ORDER — ONDANSETRON HCL 4 MG/2ML IJ SOLN
INTRAMUSCULAR | Status: DC | PRN
  Administered 2012-10-26: 4 mg via INTRAVENOUS

## 2012-10-26 MED ORDER — FENTANYL CITRATE 0.05 MG/ML IJ SOLN
INTRAMUSCULAR | Status: DC | PRN
  Administered 2012-10-26 (×2): 50 ug via INTRAVENOUS
  Administered 2012-10-26: 100 ug via INTRAVENOUS

## 2012-10-26 MED ORDER — SUCCINYLCHOLINE CHLORIDE 20 MG/ML IJ SOLN
INTRAMUSCULAR | Status: DC | PRN
  Administered 2012-10-26: 120 mg via INTRAVENOUS

## 2012-10-26 MED ORDER — HYDROMORPHONE HCL PF 1 MG/ML IJ SOLN: 1.0000 mg | Freq: Once | INTRAMUSCULAR | Status: AC

## 2012-10-26 MED ORDER — PROPOFOL 10 MG/ML IV BOLUS
INTRAVENOUS | Status: DC | PRN
  Administered 2012-10-26: 200 mg via INTRAVENOUS
  Administered 2012-10-26: 60 mg via INTRAVENOUS

## 2012-10-26 MED ORDER — ONDANSETRON HCL 4 MG/2ML IJ SOLN: 4.0000 mg | Freq: Once | INTRAMUSCULAR | Status: DC | PRN

## 2012-10-26 MED ORDER — NEOSTIGMINE METHYLSULFATE 1 MG/ML IJ SOLN
INTRAMUSCULAR | Status: DC | PRN
  Administered 2012-10-26: 1 mg via INTRAVENOUS

## 2012-10-26 MED ORDER — LIDOCAINE HCL (CARDIAC) 20 MG/ML IV SOLN
INTRAVENOUS | Status: DC | PRN
  Administered 2012-10-26: 60 mg via INTRAVENOUS

## 2012-10-26 MED ORDER — LACTATED RINGERS IV SOLN
INTRAVENOUS | Status: DC | PRN
  Administered 2012-10-26: 11:00:00 via INTRAVENOUS

## 2012-10-26 MED ORDER — HYDROMORPHONE HCL PF 1 MG/ML IJ SOLN
0.2500 mg | INTRAMUSCULAR | Status: DC | PRN
  Administered 2012-10-26 (×4): 0.5 mg via INTRAVENOUS

## 2012-10-26 MED ORDER — MIDAZOLAM HCL 5 MG/5ML IJ SOLN
INTRAMUSCULAR | Status: DC | PRN
  Administered 2012-10-26: 2 mg via INTRAVENOUS

## 2012-10-26 SURGICAL SUPPLY — 32 items
BANDAGE GAUZE ELAST BULKY 4 IN (GAUZE/BANDAGES/DRESSINGS) ×2 IMPLANT
BLADE SURG 10 STRL SS (BLADE) ×2 IMPLANT
BLADE SURG ROTATE 9660 (MISCELLANEOUS) IMPLANT
CANISTER SUCTION 2500CC (MISCELLANEOUS) ×2 IMPLANT
CLOTH BEACON ORANGE TIMEOUT ST (SAFETY) ×2 IMPLANT
COVER SURGICAL LIGHT HANDLE (MISCELLANEOUS) ×2 IMPLANT
DRAPE LAPAROTOMY TRNSV 102X78 (DRAPE) ×2 IMPLANT
DRAPE UTILITY 15X26 W/TAPE STR (DRAPE) ×4 IMPLANT
DRSG PAD ABDOMINAL 8X10 ST (GAUZE/BANDAGES/DRESSINGS) ×2 IMPLANT
ELECT CAUTERY BLADE 6.4 (BLADE) ×2 IMPLANT
ELECT REM PT RETURN 9FT ADLT (ELECTROSURGICAL) ×2
ELECTRODE REM PT RTRN 9FT ADLT (ELECTROSURGICAL) ×1 IMPLANT
GLOVE BIO SURGEON STRL SZ7 (GLOVE) ×2 IMPLANT
GLOVE BIOGEL PI IND STRL 7.5 (GLOVE) ×1 IMPLANT
GLOVE BIOGEL PI INDICATOR 7.5 (GLOVE) ×1
GOWN STRL NON-REIN LRG LVL3 (GOWN DISPOSABLE) ×4 IMPLANT
KIT BASIN OR (CUSTOM PROCEDURE TRAY) ×2 IMPLANT
KIT ROOM TURNOVER OR (KITS) ×2 IMPLANT
NS IRRIG 1000ML POUR BTL (IV SOLUTION) ×2 IMPLANT
PACK SURGICAL SETUP 50X90 (CUSTOM PROCEDURE TRAY) ×2 IMPLANT
PAD ARMBOARD 7.5X6 YLW CONV (MISCELLANEOUS) ×4 IMPLANT
PENCIL BUTTON HOLSTER BLD 10FT (ELECTRODE) ×2 IMPLANT
SPONGE GAUZE 4X4 12PLY (GAUZE/BANDAGES/DRESSINGS) ×2 IMPLANT
SPONGE LAP 18X18 X RAY DECT (DISPOSABLE) ×2 IMPLANT
SWAB COLLECTION DEVICE MRSA (MISCELLANEOUS) ×2 IMPLANT
SYR BULB IRRIGATION 50ML (SYRINGE) IMPLANT
TOWEL OR 17X24 6PK STRL BLUE (TOWEL DISPOSABLE) ×2 IMPLANT
TOWEL OR 17X26 10 PK STRL BLUE (TOWEL DISPOSABLE) ×2 IMPLANT
TUBE ANAEROBIC SPECIMEN COL (MISCELLANEOUS) ×2 IMPLANT
TUBE CONNECTING 12X1/4 (SUCTIONS) ×2 IMPLANT
WATER STERILE IRR 1000ML POUR (IV SOLUTION) ×2 IMPLANT
YANKAUER SUCT BULB TIP NO VENT (SUCTIONS) ×2 IMPLANT

## 2012-10-26 NOTE — Transfer of Care (Signed)
Immediate Anesthesia Transfer of Care Note  Patient: Antonio [name]  Procedure(s) Performed: Procedure(s): IRRIGATION AND DEBRIDEMENT PERINEAL ABSCESS (N/A)  Patient Location: PACU  Anesthesia Type:General  Level of Consciousness: awake, alert  and oriented  Airway & Oxygen Therapy: Patient Spontanous Breathing and Patient connected to face mask oxygen  Post-op Assessment: Report given to PACU RN, Post -op Vital signs reviewed and stable and Patient moving all extremities X 4  Post vital signs: Reviewed and stable  Complications: No apparent anesthesia complications

## 2012-10-26 NOTE — Anesthesia Procedure Notes (Signed)
Procedure Name: Intubation Date/Time: 10/26/2012 10:53 AM Performed by: Elon Alas Pre-anesthesia Checklist: Patient identified, Emergency Drugs available, Suction available, Timeout performed and Patient being monitored Patient Re-evaluated:Patient Re-evaluated prior to inductionOxygen Delivery Method: Circle system utilized Preoxygenation: Pre-oxygenation with 100% oxygen Intubation Type: IV induction Ventilation: Mask ventilation without difficulty Laryngoscope Size: Mac and 4 Grade View: Grade III Tube type: Oral Tube size: 7.5 mm Number of attempts: 1 Airway Equipment and Method: Rigid stylet Placement Confirmation: breath sounds checked- equal and bilateral,  positive ETCO2 and ETT inserted through vocal cords under direct vision Secured at: 23 cm Tube secured with: Tape Dental Injury: Teeth and Oropharynx as per pre-operative assessment

## 2012-10-26 NOTE — Preoperative (Signed)
Beta Blockers   Reason not to administer Beta Blockers:Not Applicable 

## 2012-10-26 NOTE — Anesthesia Postprocedure Evaluation (Signed)
  Anesthesia Post-op Note  Patient: Antonio [name]  Procedure(s) Performed: Procedure(s): IRRIGATION AND DEBRIDEMENT PERINEAL ABSCESS (N/A)  Patient Location: PACU  Anesthesia Type:General  Level of Consciousness: awake, oriented, sedated and patient cooperative  Airway and Oxygen Therapy: Patient Spontanous Breathing  Post-op Pain: mild  Post-op Assessment: Post-op Vital signs reviewed, Patient's Cardiovascular Status Stable, Respiratory Function Stable, Patent Airway, No signs of Nausea or vomiting and Pain level controlled  Post-op Vital Signs: stable  Complications: No apparent anesthesia complications

## 2012-10-26 NOTE — Anesthesia Preprocedure Evaluation (Addendum)
Anesthesia Evaluation  Patient identified by MRN, date of birth, ID band Patient awake    Reviewed: Allergy & Precautions, H&P , NPO status , Patient's Chart, lab work & pertinent test results, reviewed documented beta blocker date and time   Airway Mallampati: I TM Distance: >3 FB Neck ROM: full    Dental  (+) Dental Advisory Given   Pulmonary former smoker,          Cardiovascular hypertension, Pt. on home beta blockers + CAD, + Past MI and + Cardiac Stents Rhythm:regular Rate:Normal     Neuro/Psych    GI/Hepatic GERD-  Medicated,  Endo/Other  diabetes, Type 2, Insulin Dependent and Oral Hypoglycemic AgentsMorbid obesity  Renal/GU      Musculoskeletal   Abdominal   Peds  Hematology  (+) Blood dyscrasia, anemia ,   Anesthesia Other Findings   Reproductive/Obstetrics                          Anesthesia Physical Anesthesia Plan  ASA: III  Anesthesia Plan: General   Post-op Pain Management:    Induction: Intravenous  Airway Management Planned: Oral ETT and LMA  Additional Equipment:   Intra-op Plan:   Post-operative Plan: Extubation in OR  Informed Consent: I have reviewed the patients History and Physical, chart, labs and discussed the procedure including the risks, benefits and alternatives for the proposed anesthesia with the patient or authorized representative who has indicated his/her understanding and acceptance.   Dental advisory given  Plan Discussed with: Anesthesiologist and Surgeon  Anesthesia Plan Comments:         Anesthesia Quick Evaluation

## 2012-10-26 NOTE — Progress Notes (Signed)
Subjective: The patient notes no bloody bowel movements since yesterday.  The patient's Hb has been stable over the last 24 hours.  The patient is s/p I&D of perineal abscess this morning.  Objective: Vital signs in last 24 hours: Filed Vitals:   10/26/12 1145 10/26/12 1200 10/26/12 1215 10/26/12 1250  BP: 133/78 143/87 133/81 128/80  Pulse: 101 101 101 101  Temp: 98.9 F (37.2 C)   98.9 F (37.2 C)  TempSrc:    Oral  Resp: 22 21 24    Height:      Weight:      SpO2: 100% 100% 97%    Weight change:   Intake/Output Summary (Last 24 hours) at 10/26/12 1259 Last data filed at 10/26/12 1142  Gross per 24 hour  Intake   3195 ml  Output    375 ml  Net   2820 ml  General: alert, cooperative, and in no apparent distress HEENT: pupils equal round and reactive to light, vision grossly intact, oropharynx clear and non-erythematous  Neck: supple, no lymphadenopathy Lungs: clear to ascultation bilaterally, normal work of respiration, no wheezes, rales, ronchi Heart: regular rate and rhythm, grade II/VI early systolic murmur best heard at LUSB Abdomen: soft, non-tender, non-distended, normal bowel sounds  GU: Packing in place at site of I&D Extremities: no cyanosis, clubbing, or edema Neurologic: alert & oriented X3, cranial nerves II-XII intact, strength grossly intact, sensation intact to light touch  Lab Results: Basic Metabolic Panel:  Recent Labs Lab 10/24/12 1918 10/26/12 0637  NA 134* 134*  K 3.2* 3.4*  CL 101 101  CO2 22 22  GLUCOSE 164* 150*  BUN 13 10  CREATININE 0.98 0.98  CALCIUM 8.3* 8.2*   Liver Function Tests:  Recent Labs Lab 10/24/12 1918  AST 15  ALT 10  ALKPHOS 40  BILITOT 0.5  PROT 5.9*  ALBUMIN 2.4*   CBC:  Recent Labs Lab 10/25/12 2010 10/26/12 0753  WBC 12.0* 11.9*  HGB 7.6* 7.4*  HCT 21.6* 21.3*  MCV 83.4 83.2  PLT 214 176   CBG:  Recent Labs Lab 10/25/12 0806 10/25/12 1155 10/25/12 1823 10/25/12 2235 10/26/12 0813  10/26/12 1240  GLUCAP 171* 213* 186* 149* 131* 128*   Urinalysis:  Recent Labs Lab 10/23/12 0249  COLORURINE YELLOW  LABSPEC 1.026  PHURINE 5.5  GLUCOSEU NEGATIVE  HGBUR NEGATIVE  BILIRUBINUR NEGATIVE  KETONESUR NEGATIVE  PROTEINUR NEGATIVE  UROBILINOGEN 0.2  NITRITE NEGATIVE  LEUKOCYTESUR SMALL*     Micro Results: Recent Results (from the past 240 hour(s))  URINE CULTURE     Status: None   Collection Time    10/23/12  2:49 AM      Result Value Range Status   Specimen Description URINE, CLEAN CATCH   Final   Special Requests NONE   Final   Culture  Setup Time 10/23/2012 08:58   Final   Colony Count 10,000 COLONIES/ML   Final   Culture     Final   Value: Multiple bacterial morphotypes present, none predominant. Suggest appropriate recollection if clinically indicated.   Report Status 10/24/2012 FINAL   Final  CULTURE, BLOOD (ROUTINE X 2)     Status: None   Collection Time    10/23/12  3:25 AM      Result Value Range Status   Specimen Description BLOOD LEFT ARM   Final   Special Requests BOTTLES DRAWN AEROBIC ONLY 2.5CC   Final   Culture  Setup Time 10/23/2012 08:54   Final  Culture     Final   Value:        BLOOD CULTURE RECEIVED NO GROWTH TO DATE CULTURE WILL BE HELD FOR 5 DAYS BEFORE ISSUING A FINAL NEGATIVE REPORT   Report Status PENDING   Incomplete  CULTURE, BLOOD (ROUTINE X 2)     Status: None   Collection Time    10/23/12  3:35 AM      Result Value Range Status   Specimen Description BLOOD RIGHT ARM   Final   Special Requests BOTTLES DRAWN AEROBIC ONLY 5CC   Final   Culture  Setup Time 10/23/2012 08:54   Final   Culture     Final   Value:        BLOOD CULTURE RECEIVED NO GROWTH TO DATE CULTURE WILL BE HELD FOR 5 DAYS BEFORE ISSUING A FINAL NEGATIVE REPORT   Report Status PENDING   Incomplete  SURGICAL PCR SCREEN     Status: None   Collection Time    10/25/12 11:45 AM      Result Value Range Status   MRSA, PCR NEGATIVE  NEGATIVE Final    Staphylococcus aureus NEGATIVE  NEGATIVE Final   Comment:            The Xpert SA Assay (FDA     approved for NASAL specimens     in patients over 56 years of age),     is one component of     a comprehensive surveillance     program.  Test performance has     been validated by [employer] for patients greater     than or equal to 20 year old.     It is not intended     to diagnose infection nor to     guide or monitor treatment.   Studies/Results: Nm Gi Blood Loss  10/24/2012   *RADIOLOGY REPORT*  Clinical Data: Post colonoscopy.  Prior blood per stool  NUCLEAR MEDICINE GASTROINTESTINAL BLEEDING STUDY  Technique:  Sequential abdominal images were obtained following intravenous administration of Tc-66m labeled red blood cells.  Radiopharmaceutical: CURIE ULTRATAG TECHNETIUM TC 16M- LABELED RED BLOOD CELLS IV KIT  Comparison: None.  Findings: There is no endoluminal accumulation of tagged red blood cells within the gastrointestinal tract to suggest active gastrointestinal bleeding.  Physiologic uptake noted within the blood pool and GU tract.  IMPRESSION: No scintigraphic evidence of active gastrointestinal bleeding.   Original Report Authenticated By: Genevive Bi, M.D.   Medications: I have reviewed the patient's current medications. Scheduled Meds: . HYDROmorphone      . HYDROmorphone      .  HYDROmorphone (DILAUDID) injection  1 mg Intravenous Once  . insulin aspart  0-15 Units Subcutaneous TID WC  . insulin glargine  25 Units Subcutaneous QHS  . pantoprazole  40 mg Oral QHS  . piperacillin-tazobactam (ZOSYN)  IV  3.375 g Intravenous Q8H   Continuous Infusions: . sodium chloride 75 mL/hr at 10/25/12 2331   PRN Meds:.acetaminophen, HYDROcodone-acetaminophen, hydrocortisone cream, morphine injection Assessment/Plan: The patient is a 52 yo man, history of CAD, HTN, prior diverticulosis s/p partial colectomy, presenting with a diverticular bleed.   # Acute blood loss  anemia/Diverticulosis - The patient presented with painless blood per rectum, likely representing diverticular bleed. NM scan showed no bleeding 7/18 or 7/25.  Colonoscopy negative 7/19, but showed bleeding without obvious source 7/23.  EGD negative 7/19.  At this point, options include subtotal colectomy (since the source  of bleeding is not apparent) vs continued watchful waiting, repeating tagged RBC scan if pt has bright red blood per rectum (dark blood, as present this morning, likely represents old blood, and NM study likely to be negative). -continue to follow CBC q12 hours, transfuse for Hb < 7 -avoid antiplatelet agents -GI following, greatly appreciate recs  -Surgery following, greatly appreciate recs  -continue protonix   # Perineal abscess - Pt notes pain and discharge from perineum.  Patient began having daily fevers on 7/23. CXR, blood cultures, and urine culture negative.  S/p I&D 7/27 -tylenol prn for fever  -continue zosyn -added morphine 4mg  q3hrs prn for pain  # CAD - The patient has a history of CAD, with 3 prior stents, most recently 2012 (DES), at [facility]. Patient presented on dual antiplatelet therapy, but after reviewing guidelines and discussing with the patient's PCP, we decided to discharge the patient home on aspirin monotherapy. Holding all anticoagulants now in the setting of GI bleed.  -hold aspirin until Hb stabilizes  -recommend restarting aspirin monotherapy at discharge   # DM - history of DM, diagnosed in 2004. Pt takes home Lantus 51 units daily. CBG's have been well-controlled this hospitalization  -continue Lantus 25, with moderate SSI  -uptitrate Lantus towards home dose as PO intake improves   # HTN - History of HTN, on home lisinopril, atneolol, chlorthalidone  -holding antihypertensives in the setting of acute blood loss anemia. Can re-add as tolerated   Dispo: Disposition is deferred at this time, awaiting improvement of current medical  problems.   The patient does have a current PCP (Dr. Hazle Nordmann, [facility]) and does not need an [facility] hospital follow-up appointment after discharge   .Services Needed at time of discharge: Y = Yes, Blank = No PT:   OT:   RN:   Equipment:   Other:     LOS: 11 days   Linward Headland, MD 10/26/2012, 12:59 PM

## 2012-10-26 NOTE — Op Note (Signed)
Preop diagnosis: Perineal abscess Postop diagnosis: Same Procedure performed: Incision and debridement of perineal abscess Surgeon:Omaree Fuqua K. Anesthesia: Gen. Endotracheal Indications: This is a 52 year old male with diabetes who presents with a couple of days of scrotal swelling and perineal tenderness. He has developed some foul-smelling drainage just anterior to his rectum. He presents now for examination under anesthesia and debridement.  Description of procedure patient brought to the operating room placed in supine position on the operating room table. After an adequate level of general anesthesia was obtained, the patient's legs were placed in lithotomy position in yellowfin stirrups. His perineum was prepped with Betadine and draped sterile fashion. Timeout was taken to ensure the proper patient proper procedure. The patient has a anterior hemorrhoidal skin tag. Just anterior to this there is an opening draining some very foul-smelling drainage. This opening tracks up almost to the base of the scrotum. We excised the skin and subcutaneous tissue in his perineum from the anterior edge of the rectum to the base of the scrotum. A large amount of necrotic tissue was removed. This does not seem to track any further up in the scrotum and groin. Once we had debrided all the necrotic tissue we used pulse lavage with 3 L of saline to lavage this area. Hemostasis was obtained with cautery. We packed the wound with saline moistened gauze and held this in place with an ABD pad and mesh shorts. Patient was then extubated and brought to the recovery room stable condition. All sponge, instrument, and needle counts are correct.  Wilmon Arms. Corliss Skains, MD, [facility] Surgery  General/ Trauma Surgery  10/26/2012 11:50 AM

## 2012-10-27 DIAGNOSIS — L02215 Cutaneous abscess of perineum: Secondary | ICD-10-CM | POA: Diagnosis present

## 2012-10-27 LAB — CBC
HCT: 21.2 % — ABNORMAL LOW (ref 39.0–52.0)
HCT: 19.8 % — ABNORMAL LOW (ref 39.0–52.0)
Hemoglobin: 6.6 g/dL — CL (ref 13.0–17.0)
MCH: 28.6 pg (ref 26.0–34.0)
MCHC: 34.4 g/dL (ref 30.0–36.0)
MCHC: 33.3 g/dL (ref 30.0–36.0)
Platelets: 307 10*3/uL (ref 150–400)
RDW: 15.1 % (ref 11.5–15.5)

## 2012-10-27 LAB — GLUCOSE, CAPILLARY
Glucose-Capillary: 148 mg/dL — ABNORMAL HIGH (ref 70–99)
Glucose-Capillary: 149 mg/dL — ABNORMAL HIGH (ref 70–99)

## 2012-10-27 MED ORDER — MORPHINE SULFATE 2 MG/ML IJ SOLN: 2.0000 mg | Freq: Once | INTRAMUSCULAR | Status: AC

## 2012-10-27 MED ORDER — MORPHINE SULFATE 2 MG/ML IJ SOLN
INTRAMUSCULAR | Status: AC
  Administered 2012-10-27: 2 mg via INTRAVENOUS
  Filled 2012-10-27: qty 1

## 2012-10-27 MED ORDER — MORPHINE SULFATE 4 MG/ML IJ SOLN
6.0000 mg | INTRAMUSCULAR | Status: DC | PRN
  Administered 2012-10-27 – 2012-10-28 (×9): 6 mg via INTRAVENOUS
  Filled 2012-10-27 (×9): qty 2

## 2012-10-27 NOTE — Progress Notes (Signed)
Subjective: Wife and sister at bedside this AM. Notes that he is afraid of eating given that it might precipitate a bloody BM. Also feels he needs increased pain medicine.   Reiterated to patient the importance of eating for nutrition to promote wound healing. Per surgery, BM would not be an issue with wound. Also 9 days past last dose of prasugrel which also decreases the likelihood of bleeding.   Hb stable at 7 this AM.  Objective: Vital signs in last 24 hours: Filed Vitals:   10/26/12 1250 10/26/12 1500 10/26/12 2133 10/27/12 0511  BP: 128/80 142/79 123/72 129/82  Pulse: 101 96 97 91  Temp: 98.9 F (37.2 C) 98.5 F (36.9 C) 98.9 F (37.2 C) 98.9 F (37.2 C)  TempSrc: Oral Axillary Oral Oral  Resp:  16 18 16   Height:      Weight:      SpO2: 98% 100% 100% 100%   Physical Exam General: alert, cooperative, and in no apparent distress HEENT: pupils equal round and reactive to light, vision grossly intact, oropharynx clear and non-erythematous  Heart: regular rate and rhythm, early systolic murmur grade II/VI at LUSB  Abdomen: soft, non-tender, non-distended, normal bowel sounds Extremities: 2+ DP/PT pulses bilaterally, no cyanosis, clubbing, or edema Neurologic: alert & oriented X3, cranial nerves II-XII intact, strength grossly intact, sensation intact to light touch  Lab Results: CBC    Component Value Date/Time   WBC 9.8 10/26/2012 1923   RBC 2.42* 10/26/2012 1923   HGB 7.2* 10/26/2012 1923   HCT 20.5* 10/26/2012 1923   PLT 252 10/26/2012 1923   MCV 84.7 10/26/2012 1923   MCH 29.8 10/26/2012 1923   MCHC 35.1 10/26/2012 1923   RDW 15.2 10/26/2012 1923   LYMPHSABS 2.4 10/19/2012 0515   MONOABS 0.8 10/19/2012 0515   EOSABS 0.1 10/19/2012 0515   BASOSABS 0.0 10/19/2012 0515   Medications: I have reviewed the patient's current medications. Scheduled Meds: . insulin aspart  0-15 Units Subcutaneous TID WC  . insulin glargine  25 Units Subcutaneous QHS  . pantoprazole  40 mg Oral  QHS  . piperacillin-tazobactam (ZOSYN)  IV  3.375 g Intravenous Q8H   PRN Meds:.acetaminophen, HYDROcodone-acetaminophen, hydrocortisone cream, morphine injection  Assessment/Plan: 52 year old male veteran w/ diverticulosis s/p bowel resection, anemia, CAD s/p stent placements, DM2 initially admitted to ICU for hypotension from acute lower GI bleed likely 2/2 diverticular bleeding and medication now 2 days s/p incision & drainage of perineal abscess with stable Hb.  #S/p incision & drainage of perineal abscess: Continue on Zosyn and will narrow coverage pending sensitivities -Increase morphine 4mg  q3h to 6mg  q3h.   -Surgery following and appreciate recs  #Lower GI bleeding: pRBCs x11 since admission. -Advance diet to carb modified -Per surgery; will follow Hb/Hct and offer recommendations as well as checking CBC q12h -Advised patient to monitor for bloody BM and symptoms. -Continue pantoprazole for GI prophylaxis.  -GI & surgery following and appreciate recs  #CAD s/p stent placements: PCP in agreement to continue patient on ASA monotherapy pending discharge.  #DM2: Continue CBG TID.   #Disposition: Pending improvement in health but possibly in 2-3 days; will need to have stable Hb for at least 48 hours.  This is a Psychologist, occupational Note.  The care of the patient was discussed with Dr. Janalyn Harder and the assessment and plan formulated with their assistance.  Please see their attached note for official documentation of the daily encounter.   LOS: 12 days   Antonio [name]  Terrilee Croak, Med Student 10/27/2012, 8:10 AM

## 2012-10-27 NOTE — Progress Notes (Signed)
I have seen the patient and reviewed the daily progress note by Heywood Iles MS4 and discussed the care of the patient with them.  See below for documentation of my findings, assessment, and plans.  Subjective: The patient notes no further bloody bowel movements.  He still notes pain from the site of his I&D.  The patient's Hb has remained stable this morning at 7.3.  Objective: Vital signs in last 24 hours: Filed Vitals:   10/26/12 1250 10/26/12 1500 10/26/12 2133 10/27/12 0511  BP: 128/80 142/79 123/72 129/82  Pulse: 101 96 97 91  Temp: 98.9 F (37.2 C) 98.5 F (36.9 C) 98.9 F (37.2 C) 98.9 F (37.2 C)  TempSrc: Oral Axillary Oral Oral  Resp:  16 18 16   Height:      Weight:      SpO2: 98% 100% 100% 100%   Weight change:   Intake/Output Summary (Last 24 hours) at 10/27/12 1045 Last data filed at 10/27/12 0840  Gross per 24 hour  Intake 2972.5 ml  Output   1000 ml  Net 1972.5 ml  General: alert, cooperative, and in no apparent distress HEENT: pupils equal round and reactive to light, vision grossly intact, oropharynx clear and non-erythematous  Neck: supple, no lymphadenopathy Lungs: clear to ascultation bilaterally, normal work of respiration, no wheezes, rales, ronchi Heart: regular rate and rhythm, grade II/VI early systolic murmur best heard at LUSB Abdomen: soft, non-tender, non-distended, normal bowel sounds  GU: Packing in place at site of perineal I&D  Extremities: no cyanosis, clubbing, or edema Neurologic: alert & oriented X3, cranial nerves II-XII intact, strength grossly intact, sensation intact to light touch  Lab Results: Reviewed and documented in Electronic Record Micro Results: Reviewed and documented in Electronic Record Studies/Results: Reviewed and documented in Electronic Record Medications: I have reviewed the patient's current medications. Scheduled Meds: . insulin aspart  0-15 Units Subcutaneous TID WC  . insulin glargine  25 Units  Subcutaneous QHS  . pantoprazole  40 mg Oral QHS  . piperacillin-tazobactam (ZOSYN)  IV  3.375 g Intravenous Q8H   Continuous Infusions: . sodium chloride 75 mL/hr at 10/27/12 0246   PRN Meds:.acetaminophen, HYDROcodone-acetaminophen, hydrocortisone cream, morphine injection Assessment/Plan: The patient is a 52 yo man, history of CAD, HTN, prior diverticulosis s/p partial colectomy, presenting with a diverticular bleed.   # Acute blood loss anemia/Diverticulosis - The patient presented with painless blood per rectum, likely representing diverticular bleed. NM scan showed no bleeding 7/18 or 7/25. Colonoscopy negative 7/19, but showed bleeding without obvious source 7/23. EGD negative 7/19. The patient notes no further bloody bowel movements. -continue to follow CBC q12 hours, transfuse for Hb < 7  -avoid antiplatelet agents  -GI following, greatly appreciate recs  -Surgery following, greatly appreciate recs  -continue protonix  -counseled patient on the importance of advancing his diet  # Perineal abscess - Pt notes pain and discharge from perineum. Patient began having daily fevers on 7/23. CXR, blood cultures, and urine culture negative. S/p I&D 7/27  -tylenol prn for fever  -continue zosyn  -increased morphine dose, can likely transition to PO pain medications soon -per surgery, sitz baths after BM to keep perineal wound area clean  # CAD - The patient has a history of CAD, with 3 prior stents, most recently 2012 (DES), at [facility]. Patient presented on dual antiplatelet therapy, but after reviewing guidelines and discussing with the patient's PCP, we decided to discharge the patient home on aspirin monotherapy. Holding all  anticoagulants now in the setting of GI bleed.  -hold aspirin until Hb stabilizes  -recommend restarting aspirin monotherapy at discharge   # DM - history of DM, diagnosed in 2004. Pt takes home Lantus 51 units daily. CBG's have been well-controlled this  hospitalization  -continue Lantus 25, with moderate SSI   # HTN - History of HTN, on home lisinopril, atneolol, chlorthalidone  -holding antihypertensives in the setting of acute blood loss anemia. Can re-add as tolerated   Dispo: Disposition is deferred at this time, awaiting improvement of current medical problems.   The patient does have a current PCP (Dr. Hazle Nordmann, [facility]) and does not need an [facility] hospital follow-up appointment after discharge  .Services Needed at time of discharge: Y = Yes, Blank = No PT:   OT:   RN:   Equipment:   Other:     LOS: 12 days   Linward Headland, MD 10/27/2012, 10:45 AM

## 2012-10-27 NOTE — Progress Notes (Signed)
Patient ID: Antonio [name], male   DOB: [birth year], 52 y.o.   MRN: [id number] 1 Day Post-Op  Subjective: Pt feels ok considering.  He is scared to eat because he is scared to have a BM.  No further bloody BMs in several days.  Objective: Vital signs in last 24 hours: Temp:  [98.5 F (36.9 C)-98.9 F (37.2 C)] 98.9 F (37.2 C) (07/28 0511) Pulse Rate:  [91-102] 91 (07/28 0511) Resp:  [16-24] 16 (07/28 0511) BP: (123-143)/(72-87) 129/82 mmHg (07/28 0511) SpO2:  [97 %-100 %] 100 % (07/28 0511) Last BM Date: 10/25/12  Intake/Output from previous day: 07/27 0701 - 07/28 0700 In: 2952.5 [P.O.:600; I.V.:2352.5] Out: 1300 [Urine:900; Stool:400] Intake/Output this shift: Total I/O In: 20 [P.O.:20] Out: -   PE: Abd: soft, NT, Nd, +BS GU: deferred secondary to recent dressing change  Lab Results:   Recent Labs  10/26/12 1923 10/27/12 0817  WBC 9.8 11.6*  HGB 7.2* 7.3*  HCT 20.5* 21.2*  PLT 252 307   BMET  Recent Labs  10/24/12 1918 10/26/12 0637  NA 134* 134*  K 3.2* 3.4*  CL 101 101  CO2 22 22  GLUCOSE 164* 150*  BUN 13 10  CREATININE 0.98 0.98  CALCIUM 8.3* 8.2*   PT/INR No results found for this basename: LABPROT, INR,  in the last 72 hours CMP     Component Value Date/Time   NA 134* 10/26/2012 0637   K 3.4* 10/26/2012 0637   CL 101 10/26/2012 0637   CO2 22 10/26/2012 0637   GLUCOSE 150* 10/26/2012 0637   BUN 10 10/26/2012 0637   CREATININE 0.98 10/26/2012 0637   CALCIUM 8.2* 10/26/2012 0637   PROT 5.9* 10/24/2012 1918   ALBUMIN 2.4* 10/24/2012 1918   AST 15 10/24/2012 1918   ALT 10 10/24/2012 1918   ALKPHOS 40 10/24/2012 1918   BILITOT 0.5 10/24/2012 1918   GFRNONAA >90 10/26/2012 0637   GFRAA >90 10/26/2012 0637   Lipase  No results found for this basename: lipase       Studies/Results: No results found.  Anti-infectives: Anti-infectives   Start     Dose/Rate Route Frequency Ordered Stop   10/25/12 1400  piperacillin-tazobactam (ZOSYN) IVPB 3.375 g     3.375 g 12.5 mL/hr over 240 Minutes Intravenous 3 times per day 10/25/12 1307         Assessment/Plan  1. GI bleed 2. Perineal abscess  Plan: 1. Cont to follow for further bleeding.  Right now hgb stable without any active signs of bleeding 2. Will do dressing change tomorrow morning to evaluate and make sure he doesn't need any further debridement.  Cont abx therapy 3. Patient needs to have protein intake encouraged as well as a diet to help healing.   If he eventually needs it, a stool softner can be added.   4. Sitz bathes s/p each BM.  LOS: 12 days    Chares Slaymaker E 10/27/2012, 10:56 AM Pager: [phone]

## 2012-10-27 NOTE — Plan of Care (Signed)
Problem: Phase III Progression Outcomes Goal: H&H stablized <1gm drop in 24 hrs, no active bleeding Outcome: Completed/Met Date Met:  10/27/12 Hgb stable

## 2012-10-27 NOTE — Progress Notes (Signed)
Internal Medicine Attending  Date: 10/27/2012  Patient name: Antonio [name] Medical record number: [id number] Date of birth: May 22, 1960 Age: 52 y.o. Gender: male  I saw and evaluated the patient. I reviewed the resident's note by Dr. Manson Passey and I agree with the resident's findings and plans as documented in his progress note.  Hgb has remained stable but [name] has been scared to eat because he is afraid of having a bowel movement.  We discussed the importance of good nutrition to promote wound healing.  Appreciate surgery's intervention and recommendations.  We will continue current supportive care.

## 2012-10-27 NOTE — Progress Notes (Signed)
No BM, wound edges clean, quite sore. Patient examined and I agree with the assessment and plan  Violeta Gelinas, MD, MPH, FACS Pager: [phone]  10/27/2012 1:43 PM

## 2012-10-27 NOTE — Progress Notes (Signed)
CRITICAL VALUE ALERT  Critical value received:  hbg 6.6  Date of notification:  10/27/2012  Time of notification:  2109  Critical value read back:yes  Nurse who received alert:  Rosalio Loud RN  MD notified (1st page):  MD Delane Ginger (Provider on call for [facility])  Time of first page:  2109  Responding MD:  MD Delane Ginger  Time MD responded:  2110

## 2012-10-28 ENCOUNTER — Encounter (HOSPITAL_COMMUNITY): Payer: Self-pay | Admitting: Surgery

## 2012-10-28 LAB — GLUCOSE, CAPILLARY
Glucose-Capillary: 137 mg/dL — ABNORMAL HIGH (ref 70–99)
Glucose-Capillary: 143 mg/dL — ABNORMAL HIGH (ref 70–99)
Glucose-Capillary: 146 mg/dL — ABNORMAL HIGH (ref 70–99)

## 2012-10-28 LAB — PREPARE RBC (CROSSMATCH)

## 2012-10-28 MED ORDER — HYDROCODONE-ACETAMINOPHEN 5-325 MG PO TABS
1.0000 | ORAL_TABLET | ORAL | Status: DC | PRN
  Administered 2012-10-29: 2 via ORAL
  Administered 2012-10-29: 1 via ORAL
  Filled 2012-10-28 (×2): qty 2
  Filled 2012-10-28: qty 1

## 2012-10-28 NOTE — Progress Notes (Signed)
  Subjective: Wife and sister at bedside this AM. Patient appeared visibly happier and said that he would "take it one day at a time." No bloody BMs despite overnight Hb dropping to 6.6 and transfusing pRBC x2.   Abscess cultures remarkable for some Gram- rods and rare Gram+ cocci in pairs.  Objective: Vital signs in last 24 hours: Filed Vitals:   10/28/12 0430 10/28/12 0435 10/28/12 0515 10/28/12 0615  BP:  133/82 107/68 136/80  Pulse:  86 83 87  Temp:  98.6 F (37 C) 98.4 F (36.9 C) 98.3 F (36.8 C)  TempSrc:  Oral Oral Oral  Resp:  18 18 18   Height:      Weight: 96 kg (211 lb 10.3 oz)     SpO2:  99%  99%   #Physical Exam General: alert, cooperative, and in no apparent distress HEENT: pupils equal round and reactive to light, vision grossly intact, oropharynx clear and non-erythematous  Heart: regular rate and rhythm, early systolic murmur grade II/VI at LUSB  Abdomen: soft, non-tender, non-distended, normal bowel sounds Extremities: 2+ DP/PT pulses bilaterally, no cyanosis, clubbing, or edema Neurologic: alert & oriented X3, cranial nerves II-XII intact, strength grossly intact, sensation intact to light touch  Lab Results: CBC    Component Value Date/Time   WBC 12.3* 10/27/2012 2018   RBC 2.31* 10/27/2012 2018   HGB 6.6* 10/27/2012 2018   HCT 19.8* 10/27/2012 2018   PLT 317 10/27/2012 2018   MCV 85.7 10/27/2012 2018   MCH 28.6 10/27/2012 2018   MCHC 33.3 10/27/2012 2018   RDW 15.3 10/27/2012 2018   LYMPHSABS 2.4 10/19/2012 0515   MONOABS 0.8 10/19/2012 0515   EOSABS 0.1 10/19/2012 0515   BASOSABS 0.0 10/19/2012 0515   Medications: I have reviewed the patient's current medications. Scheduled Meds: . insulin aspart  0-15 Units Subcutaneous TID WC  . insulin glargine  25 Units Subcutaneous QHS  . pantoprazole  40 mg Oral QHS  . piperacillin-tazobactam (ZOSYN)  IV  3.375 g Intravenous Q8H   PRN Meds:.acetaminophen, HYDROcodone-acetaminophen, hydrocortisone cream, morphine  injection  Assessment/Plan: 52 year old male veteran w/ diverticulosis s/p bowel resection, anemia, CAD s/p stent placements, DM2 initially admitted to ICU for hypotension from acute lower GI bleed likely 2/2 diverticular bleeding and medication now 3 days s/p incision & drainage of perineal abscess.  #S/p incision & drainage of perineal abscess: Continue BID dressing changes for wound -Switching Zosyn to Augmentin given culture results. May need to reconsider if no improvement. -Will need to tolerate PO medication for pain control prior to discharge -Surgery following and appreciate recs  #Lower GI bleeding: pRBCs x13 since admission. -Checking CBC q24h versus q12h -Advised patient to monitor for bloody BM and symptoms. -Continue pantoprazole for GI prophylaxis.  -GI & surgery following and appreciate recs  #CAD s/p stent placements: PCP in agreement to continue patient on ASA monotherapy pending discharge.  #DM2: Continue CBG TID.   #Disposition: Pending improvement in health but possibly in 2-3 days; will need to have stable Hb for at least 24 hours.  This is a Psychologist, occupational Note.  The care of the patient was discussed with Dr. Doneen Poisson and the assessment and plan formulated with their assistance.  Please see their attached note for official documentation of the daily encounter.   LOS: 13 days   Beather Arbour, Med Student 10/28/2012, 6:41 AM

## 2012-10-28 NOTE — Progress Notes (Signed)
Internal Medicine Attending Progress Note Date: 10/28/2012  Patient name: Antonio [name] Medical record number: [id number] Date of birth: October 30, 1960 Age: 52 y.o. Gender: male  I saw and evaluated the patient. I reviewed the student's note and I agree with the student's findings and plan as documented in the student's note.  S: Antonio [name] has had no bloody bowel movements as of this morning.  He has not had a bowel movement but is passing gas.  He states he is eating well.  His would reportedly looks good.  Physical Exam:  Filed Vitals:   10/28/12 0515 10/28/12 0615 10/28/12 0702 10/28/12 1500  BP: 107/68 136/80 144/78 150/90  Pulse: 83 87 88 77  Temp: 98.4 F (36.9 C) 98.3 F (36.8 C) 98.8 F (37.1 C) 98.1 F (36.7 C)  TempSrc: Oral Oral Oral Oral  Resp: 18 18 16 18   Height:      Weight:      SpO2:  99% 96% 100%   Gen: WDWN man lying comfortably in bed in NAD Psych: Mood markedly improved and actually smiling today.  Lab results:  CBC:  Recent Labs  10/27/12 0817 10/27/12 2018  WBC 11.6* 12.3*  HGB 7.3* 6.6*  HCT 21.2* 19.8*  MCV 85.5 85.7  PLT 307 317   CBG:  Recent Labs  10/27/12 1146 10/27/12 1716 10/27/12 2106 10/28/12 0740 10/28/12 1209 10/28/12 1741  GLUCAP 143* 153* 149* 137* 143* 196*   Assessment & Plan:  1) Perineal abscess: Wound is healing well and he is being switched to oral antibiotics.  He still requires IV pain medications for dressing changes but I suspect this need should lessen moving forward.  Eating well to promote wound healing.  2) Presumed diverticular bleed:  Clinically has stopped.  Recent slight drop in Hgb likely related to recent surgery and wound drainage as well as secondary to phlebotomy.  Will continue to follow serial Hgb but do so QD instead of BID.  3) Disposition:  D/C home with home health as soon as he tolerates wound dressing changes w/o IV narcotics and his Hgb remains stable with no more bloody bowel movements.

## 2012-10-28 NOTE — Progress Notes (Signed)
Tolerating diet. No BM but passing a lot of gas. Still needing IV pain meds for wound care.I also spoke to his wife. Patient examined and I agree with the assessment and plan  Violeta Gelinas, MD, MPH, FACS Pager: [phone]  10/28/2012 3:58 PM

## 2012-10-28 NOTE — Progress Notes (Signed)
Patient ID: Antonio [name], male   DOB: [birth year], 52 y.o.   MRN: [id number] 2 Days Post-Op  Subjective: Pt feels ok this morning.  No bloody BMs.  Objective: Vital signs in last 24 hours: Temp:  [98 F (36.7 C)-98.8 F (37.1 C)] 98.8 F (37.1 C) (07/29 0702) Pulse Rate:  [69-95] 88 (07/29 0702) Resp:  [16-18] 16 (07/29 0702) BP: (107-144)/(68-82) 144/78 mmHg (07/29 0702) SpO2:  [96 %-100 %] 96 % (07/29 0702) Weight:  [211 lb 10.3 oz (96 kg)] 211 lb 10.3 oz (96 kg) (07/29 0430) Last BM Date: 10/25/12  Intake/Output from previous day: 07/28 0701 - 07/29 0700 In: 555 [P.O.:280; Blood:275] Out: 450 [Urine:450] Intake/Output this shift:    PE: Abd: soft, Nt, ND GU: perineal wound is 100% beefy red tissue.  Clean with no purulent drainage or necrotic tissue.  Lab Results:   Recent Labs  10/27/12 0817 10/27/12 2018  WBC 11.6* 12.3*  HGB 7.3* 6.6*  HCT 21.2* 19.8*  PLT 307 317   BMET  Recent Labs  10/26/12 0637  NA 134*  K 3.4*  CL 101  CO2 22  GLUCOSE 150*  BUN 10  CREATININE 0.98  CALCIUM 8.2*   PT/INR No results found for this basename: LABPROT, INR,  in the last 72 hours CMP     Component Value Date/Time   NA 134* 10/26/2012 0637   K 3.4* 10/26/2012 0637   CL 101 10/26/2012 0637   CO2 22 10/26/2012 0637   GLUCOSE 150* 10/26/2012 0637   BUN 10 10/26/2012 0637   CREATININE 0.98 10/26/2012 0637   CALCIUM 8.2* 10/26/2012 0637   PROT 5.9* 10/24/2012 1918   ALBUMIN 2.4* 10/24/2012 1918   AST 15 10/24/2012 1918   ALT 10 10/24/2012 1918   ALKPHOS 40 10/24/2012 1918   BILITOT 0.5 10/24/2012 1918   GFRNONAA >90 10/26/2012 0637   GFRAA >90 10/26/2012 0637   Lipase  No results found for this basename: lipase       Studies/Results: No results found.  Anti-infectives: Anti-infectives   Start     Dose/Rate Route Frequency Ordered Stop   10/25/12 1400  piperacillin-tazobactam (ZOSYN) IVPB 3.375 g     3.375 g 12.5 mL/hr over 240 Minutes Intravenous 3 times per day  10/25/12 1307         Assessment/Plan  1. GI bleed 2. Perineal abscess, s/p debridment   Plan: 1. hgb dropped over night to 6.6, but has not passed any BM or blood.  Unsure whether this is bleeding or some dilution.  Will monitor. 2. Wound is clean and looks good.  Continue BID dressing changes to wound.  He will need to convert to oral pain medication as he will not be able to take morphine at home with his dressing changes.  He will need HH.  LOS: 13 days    Yamilette Garretson E 10/28/2012, 12:47 PM Pager: [phone]

## 2012-10-28 NOTE — Progress Notes (Signed)
ANTIBIOTIC CONSULT NOTE - Follow-up  Pharmacy Consult for Zosyn Indication: Perineal abscess  No Known Allergies  Patient Measurements: Height: 5\' 7"  (170.2 cm) Weight: 211 lb 10.3 oz (96 kg) IBW/kg (Calculated) : 66.1 Adjusted Body Weight: 75.6  Vital Signs: Temp: 98.8 F (37.1 C) (07/29 0702) Temp src: Oral (07/29 0702) BP: 144/78 mmHg (07/29 0702) Pulse Rate: 88 (07/29 0702) Intake/Output from previous day: 07/28 0701 - 07/29 0700 In: 555 [P.O.:280; Blood:275] Out: 450 [Urine:450] Intake/Output from this shift:    Labs:  Recent Labs  10/26/12 0637  10/26/12 1923 10/27/12 0817 10/27/12 2018  WBC  --   < > 9.8 11.6* 12.3*  HGB  --   < > 7.2* 7.3* 6.6*  PLT  --   < > 252 307 317  CREATININE 0.98  --   --   --   --   < > = values in this interval not displayed. Estimated Creatinine Clearance: 97.4 ml/min (by C-G formula based on Cr of 0.98). No results found for this basename: VANCOTROUGH, Leodis Binet, VANCORANDOM, GENTTROUGH, GENTPEAK, GENTRANDOM, TOBRATROUGH, TOBRAPEAK, TOBRARND, AMIKACINPEAK, AMIKACINTROU, AMIKACIN,  in the last 72 hours   Microbiology: Recent Results (from the past 720 hour(s))  MRSA PCR SCREENING     Status: None   Collection Time    10/15/12 11:44 PM      Result Value Range Status   MRSA by PCR NEGATIVE  NEGATIVE Final   Comment:            The GeneXpert MRSA Assay (FDA     approved for NASAL specimens     only), is one component of a     comprehensive MRSA colonization     surveillance program. It is not     intended to diagnose MRSA     infection nor to guide or     monitor treatment for     MRSA infections.  URINE CULTURE     Status: None   Collection Time    10/23/12  2:49 AM      Result Value Range Status   Specimen Description URINE, CLEAN CATCH   Final   Special Requests NONE   Final   Culture  Setup Time 10/23/2012 08:58   Final   Colony Count 10,000 COLONIES/ML   Final   Culture     Final   Value: Multiple bacterial  morphotypes present, none predominant. Suggest appropriate recollection if clinically indicated.   Report Status 10/24/2012 FINAL   Final  CULTURE, BLOOD (ROUTINE X 2)     Status: None   Collection Time    10/23/12  3:25 AM      Result Value Range Status   Specimen Description BLOOD LEFT ARM   Final   Special Requests BOTTLES DRAWN AEROBIC ONLY 2.5CC   Final   Culture  Setup Time 10/23/2012 08:54   Final   Culture     Final   Value:        BLOOD CULTURE RECEIVED NO GROWTH TO DATE CULTURE WILL BE HELD FOR 5 DAYS BEFORE ISSUING A FINAL NEGATIVE REPORT   Report Status PENDING   Incomplete  CULTURE, BLOOD (ROUTINE X 2)     Status: None   Collection Time    10/23/12  3:35 AM      Result Value Range Status   Specimen Description BLOOD RIGHT ARM   Final   Special Requests BOTTLES DRAWN AEROBIC ONLY 5CC   Final   Culture  Setup Time 10/23/2012  08:54   Final   Culture     Final   Value:        BLOOD CULTURE RECEIVED NO GROWTH TO DATE CULTURE WILL BE HELD FOR 5 DAYS BEFORE ISSUING A FINAL NEGATIVE REPORT   Report Status PENDING   Incomplete  SURGICAL PCR SCREEN     Status: None   Collection Time    10/25/12 11:45 AM      Result Value Range Status   MRSA, PCR NEGATIVE  NEGATIVE Final   Staphylococcus aureus NEGATIVE  NEGATIVE Final   Comment:            The Xpert SA Assay (FDA     approved for NASAL specimens     in patients over 41 years of age),     is one component of     a comprehensive surveillance     program.  Test performance has     been validated by [employer] for patients greater     than or equal to 51 year old.     It is not intended     to diagnose infection nor to     guide or monitor treatment.  ANAEROBIC CULTURE     Status: None   Collection Time    10/26/12 11:41 AM      Result Value Range Status   Specimen Description ABSCESS PERINEAL   Final   Special Requests PATIENT ON FOLLOWING ZOSYN   Final   Gram Stain     Final   Value: RARE WBC PRESENT,  PREDOMINANTLY PMN     NO SQUAMOUS EPITHELIAL CELLS SEEN     FEW GRAM NEGATIVE RODS     RARE GRAM POSITIVE COCCI IN PAIRS   Culture     Final   Value: NO ANAEROBES ISOLATED; CULTURE IN PROGRESS FOR 5 DAYS   Report Status PENDING   Incomplete  CULTURE, ROUTINE-ABSCESS     Status: None   Collection Time    10/26/12 11:41 AM      Result Value Range Status   Specimen Description ABSCESS PERINEAL   Final   Special Requests PATIENT ON FOLLOWING ZOSYN   Final   Gram Stain     Final   Value: FEW WBC PRESENT, PREDOMINANTLY PMN     NO SQUAMOUS EPITHELIAL CELLS SEEN     FEW GRAM NEGATIVE RODS     RARE GRAM POSITIVE COCCI IN PAIRS   Culture MULTIPLE ORGANISMS PRESENT, NONE PREDOMINANT   Final   Report Status PENDING   Incomplete   Assessment: [name] admitted 7/16 with diverticular GIB s/p colonoscopy and tagged RBC scan. Pt was improving with no further bleeding and tolerating PO diet until 7/22 PM when pt began re-bleeding. Hgb 8.1 >6.4. Colonoscopy on 7/23 noted moderate diverticulosis.  Pt was started on zosyn for perineal abscess treatment. Pt is currently on day #4 of therapy. Pt is afebrile but WBC is mildly elevated at 12.3. Dose is appropriate for renal function. Cultures remains negative to date.   7/26 Zosyn>>  7/24 Blood cx>>ngtd 7/24 Urine cx>> polymicrobial, none predominant 7/27 Abscess - multiple org, none predom (pending)  Goal of Therapy:  Infection resolution  Plan:  1. Continue zosyn 3.375gm IV Q8H (4 hr inf) 2. F/u renal fxn, C&S, clinical status  3. MD - please clarify intended LOT  Lysle Pearl, PharmD, BCPS Pager # [phone] 10/28/2012 8:56 AM

## 2012-10-29 LAB — CULTURE, BLOOD (ROUTINE X 2): Culture: NO GROWTH

## 2012-10-29 LAB — CBC
MCH: 28.3 pg (ref 26.0–34.0)
MCV: 84.7 fL (ref 78.0–100.0)
Platelets: 351 10*3/uL (ref 150–400)
RBC: 3 MIL/uL — ABNORMAL LOW (ref 4.22–5.81)

## 2012-10-29 LAB — GLUCOSE, CAPILLARY
Glucose-Capillary: 109 mg/dL — ABNORMAL HIGH (ref 70–99)
Glucose-Capillary: 170 mg/dL — ABNORMAL HIGH (ref 70–99)
Glucose-Capillary: 157 mg/dL — ABNORMAL HIGH (ref 70–99)
Glucose-Capillary: 182 mg/dL — ABNORMAL HIGH (ref 70–99)

## 2012-10-29 LAB — CULTURE, ROUTINE-ABSCESS

## 2012-10-29 LAB — TYPE AND SCREEN

## 2012-10-29 MED ORDER — DOCUSATE SODIUM 100 MG PO CAPS
100.0000 mg | ORAL_CAPSULE | Freq: Two times a day (BID) | ORAL | Status: DC
  Administered 2012-10-29 – 2012-10-30 (×3): 100 mg via ORAL
  Filled 2012-10-29 (×4): qty 1

## 2012-10-29 MED ORDER — HYDROCODONE-ACETAMINOPHEN 5-325 MG PO TABS
1.0000 | ORAL_TABLET | ORAL | Status: DC | PRN
  Administered 2012-10-29: 1 via ORAL
  Administered 2012-10-29 – 2012-10-30 (×2): 2 via ORAL
  Filled 2012-10-29 (×3): qty 2

## 2012-10-29 MED ORDER — LISINOPRIL 40 MG PO TABS
40.0000 mg | ORAL_TABLET | Freq: Every day | ORAL | Status: DC
  Administered 2012-10-30: 40 mg via ORAL
  Filled 2012-10-29: qty 1

## 2012-10-29 MED ORDER — PSYLLIUM 95 % PO PACK
1.0000 | PACK | Freq: Every day | ORAL | Status: DC
  Administered 2012-10-29 – 2012-10-30 (×2): 1 via ORAL
  Filled 2012-10-29 (×2): qty 1

## 2012-10-29 MED ORDER — AMOXICILLIN-POT CLAVULANATE 875-125 MG PO TABS
1.0000 | ORAL_TABLET | Freq: Two times a day (BID) | ORAL | Status: DC
  Administered 2012-10-29 – 2012-10-30 (×3): 1 via ORAL
  Filled 2012-10-29 (×6): qty 1

## 2012-10-29 MED ORDER — OXYCODONE HCL 5 MG PO TABS
10.0000 mg | ORAL_TABLET | ORAL | Status: DC | PRN
  Administered 2012-10-29 – 2012-10-30 (×2): 10 mg via ORAL
  Filled 2012-10-29 (×2): qty 2

## 2012-10-29 NOTE — Progress Notes (Signed)
Patient ID: Anfernee Peschke, male   DOB: Jun 05, 1960, 52 y.o.   MRN: [id number] 3 Days Post-Op  Subjective: No further bleeding.  Having difficulty with dressing changes.  Objective: Vital signs in last 24 hours: Temp:  [98.1 F (36.7 C)-99.2 F (37.3 C)] 99 F (37.2 C) (07/30 0627) Pulse Rate:  [77-84] 77 (07/30 0627) Resp:  [18-20] 18 (07/30 0627) BP: (138-150)/(81-90) 138/81 mmHg (07/30 0627) SpO2:  [98 %-100 %] 98 % (07/30 0627) Weight:  [207 lb 7.3 oz (94.1 kg)] 207 lb 7.3 oz (94.1 kg) (07/30 0627) Last BM Date: 10/25/12  Intake/Output from previous day: 07/29 0701 - 07/30 0700 In: 800 [P.O.:800] Out: 1350 [Urine:1350] Intake/Output this shift:    PE: GU: dressing in place.  No concerning findings  Lab Results:   Recent Labs  10/27/12 2018 10/29/12 0610  WBC 12.3* 10.6*  HGB 6.6* 8.5*  HCT 19.8* 25.4*  PLT 317 351   BMET No results found for this basename: NA, K, CL, CO2, GLUCOSE, BUN, CREATININE, CALCIUM,  in the last 72 hours PT/INR No results found for this basename: LABPROT, INR,  in the last 72 hours CMP     Component Value Date/Time   NA 134* 10/26/2012 0637   K 3.4* 10/26/2012 0637   CL 101 10/26/2012 0637   CO2 22 10/26/2012 0637   GLUCOSE 150* 10/26/2012 0637   BUN 10 10/26/2012 0637   CREATININE 0.98 10/26/2012 0637   CALCIUM 8.2* 10/26/2012 0637   PROT 5.9* 10/24/2012 1918   ALBUMIN 2.4* 10/24/2012 1918   AST 15 10/24/2012 1918   ALT 10 10/24/2012 1918   ALKPHOS 40 10/24/2012 1918   BILITOT 0.5 10/24/2012 1918   GFRNONAA >90 10/26/2012 0637   GFRAA >90 10/26/2012 0637   Lipase  No results found for this basename: lipase       Studies/Results: No results found.  Anti-infectives: Anti-infectives   Start     Dose/Rate Route Frequency Ordered Stop   10/29/12 1000  amoxicillin-clavulanate (AUGMENTIN) 875-125 MG per tablet 1 tablet     1 tablet Oral Every 12 hours 10/29/12 0524     10/25/12 1400  piperacillin-tazobactam (ZOSYN) IVPB 3.375 g  Status:   Discontinued     3.375 g 12.5 mL/hr over 240 Minutes Intravenous 3 times per day 10/25/12 1307 10/29/12 0524       Assessment/Plan  1. GI bleed, resolved 2. Perineal abscess, s/p debridement  Plan: 1. Cont dressing changes and do with just oral pain meds.  Once he can tolerate this he can dc home later today or tomorrow, per primary service 2. HH has been arranged for daily dressing changes at home 3. Zosyn stopped and switched to augmentin.  cx mostly G - rods, few G+ cocci, but not sure that means much.  Suspect he will need another 5-7 days of abx therapy. 4. Follow up with [facility] in 1-2 weeks  LOS: 14 days    Robyn Galati E 10/29/2012, 8:45 AM Pager: [phone]

## 2012-10-29 NOTE — Progress Notes (Signed)
  Subjective: Wife at bedside this AM. Noted concerns about getting prescriptions from the [place] and the home health. Has not had a BM for last 2-3 days. Hb 8.5 this AM without any bloody BMs.   Objective: Vital signs in last 24 hours: Filed Vitals:   10/28/12 0702 10/28/12 1500 10/28/12 2057 10/29/12 0627  BP: 144/78 150/90 139/81 138/81  Pulse: 88 77 84 77  Temp: 98.8 F (37.1 C) 98.1 F (36.7 C) 99.2 F (37.3 C) 99 F (37.2 C)  TempSrc: Oral Oral Oral Oral  Resp: 16 18 20 18   Height:      Weight:    94.1 kg (207 lb 7.3 oz)  SpO2: 96% 100% 98% 98%   Physical Exam General: alert, cooperative, and in no apparent distress HEENT: pupils equal round and reactive to light, vision grossly intact, oropharynx clear and non-erythematous  Heart: regular rate and rhythm, early systolic murmur grade II/VI at LUSB  Abdomen: soft, non-tender, non-distended, normal bowel sounds Extremities: 2+ DP/PT pulses bilaterally, no cyanosis, clubbing, or edema Neurologic: alert & oriented X3, cranial nerves II-XII intact, strength grossly intact, sensation intact to light touch  Lab Results: CBC    Component Value Date/Time   WBC 10.6* 10/29/2012 0610   RBC 3.00* 10/29/2012 0610   HGB 8.5* 10/29/2012 0610   HCT 25.4* 10/29/2012 0610   PLT 351 10/29/2012 0610   MCV 84.7 10/29/2012 0610   MCH 28.3 10/29/2012 0610   MCHC 33.5 10/29/2012 0610   RDW 15.8* 10/29/2012 0610   LYMPHSABS 2.4 10/19/2012 0515   MONOABS 0.8 10/19/2012 0515   EOSABS 0.1 10/19/2012 0515   BASOSABS 0.0 10/19/2012 0515   Medications: I have reviewed the patient's current medications. Scheduled Meds: . amoxicillin-clavulanate  1 tablet Oral Q12H  . insulin aspart  0-15 Units Subcutaneous TID WC  . insulin glargine  25 Units Subcutaneous QHS  . pantoprazole  40 mg Oral QHS   PRN Meds:.acetaminophen, HYDROcodone-acetaminophen, hydrocortisone cream, morphine injection  Assessment/Plan: 52 year old male veteran w/ diverticulosis s/p bowel  resection, anemia, CAD s/p stent placements, DM2 initially admitted to ICU for hypotension from acute lower GI bleed likely 2/2 diverticular bleeding and medication now 4 days s/p incision & drainage of perineal abscess.  #S/p incision & drainage of perineal abscess: Continue BID dressing changes for wound -Transitioned pain medication (IV morphine and oral hydromorphone/acetaminophen) to oxycodone 10mg  q3h prn for pain -Started stool softeners: docusate & psyllium -Surgery following and appreciate recs  #Lower GI bleeding: pRBCs x13 since admission. -Advised patient to monitor for bloody BM and symptoms. -Continue pantoprazole for GI prophylaxis.  -GI & surgery following and appreciate recs  #CAD s/p stent placements: Need to contact PCP about updates and our plan.  #DM2: Continue CBG TID.   #Disposition: Will need to control pain with wound care and have Home Health established but likely 1-2 days.   This is a Psychologist, occupational Note.  The care of the patient was discussed with Dr. Janalyn Harder and the assessment and plan formulated with their assistance.  Please see their attached note for official documentation of the daily encounter.   LOS: 14 days   Beather Arbour, Med Student 10/29/2012, 8:20 AM

## 2012-10-29 NOTE — Progress Notes (Signed)
Internal Medicine Attending  Date: 10/29/2012  Patient name: Antonio [name] Medical record number: [id number] Date of birth: 12-04-1960 Age: 52 y.o. Gender: male  I saw and evaluated the patient. I reviewed the resident's note by Dr. Manson Passey and I agree with the resident's findings and plans as documented in his progress note.  Mr. Sitzmann Hgb has remained stable with no bloody bowel movements as of this morning.  Converting pain medications to oral and hoping for a bowel movement.  I anticipate discharge home is eminent if he remains stable.

## 2012-10-29 NOTE — Progress Notes (Addendum)
Pt had a large bowel movement with some blood in it. Pt was put on stool and sitz bath done. Think blood is from wound site and not rectum

## 2012-10-29 NOTE — Progress Notes (Signed)
No BM. Tolerated dressing change with orals. Patient examined and I agree with the assessment and plan  Violeta Gelinas, MD, MPH, FACS Pager: [phone]  10/29/2012 1:11 PM

## 2012-10-29 NOTE — Progress Notes (Signed)
I have seen the patient and reviewed the daily progress note by Heywood Iles MS4 and discussed the care of the patient with them.  See below for documentation of my findings, assessment, and plans.  Subjective: No acute events overnight.  Patient notes tolerating carb mod diet.  Still no bowel movement, though patient is passing gas.  Hemoglobin remains stable this morning.  Objective: Vital signs in last 24 hours: Filed Vitals:   10/28/12 1500 10/28/12 2057 10/29/12 0627 10/29/12 1355  BP: 150/90 139/81 138/81 156/84  Pulse: 77 84 77 64  Temp: 98.1 F (36.7 C) 99.2 F (37.3 C) 99 F (37.2 C) 98.2 F (36.8 C)  TempSrc: Oral Oral Oral Oral  Resp: 18 20 18 18   Height:      Weight:   207 lb 7.3 oz (94.1 kg)   SpO2: 100% 98% 98% 100%   Weight change: -4 lb 3 oz (-1.9 kg)  Intake/Output Summary (Last 24 hours) at 10/29/12 1532 Last data filed at 10/29/12 1430  Gross per 24 hour  Intake    220 ml  Output   1875 ml  Net  -1655 ml  General: alert, cooperative, and in no apparent distress HEENT: pupils equal round and reactive to light, vision grossly intact, oropharynx clear and non-erythematous  Neck: supple, no lymphadenopathy Lungs: clear to ascultation bilaterally, normal work of respiration, no wheezes, rales, ronchi Heart: regular rate and rhythm, grade II/VI early systolic murmur best heard at LUSB Abdomen: soft, non-tender, non-distended, normal bowel sounds  GU: Packing in place at site of perineal I&D  Extremities: no cyanosis, clubbing, or edema Neurologic: alert & oriented X3, cranial nerves II-XII intact, strength grossly intact, sensation intact to light touch  Lab Results: Reviewed and documented in Electronic Record Micro Results: Reviewed and documented in Electronic Record Studies/Results: Reviewed and documented in Electronic Record Medications: I have reviewed the patient's current medications. Scheduled Meds: . amoxicillin-clavulanate  1 tablet Oral  Q12H  . docusate sodium  100 mg Oral BID  . insulin aspart  0-15 Units Subcutaneous TID WC  . insulin glargine  25 Units Subcutaneous QHS  . pantoprazole  40 mg Oral QHS  . psyllium  1 packet Oral Daily   Continuous Infusions:  PRN Meds:.acetaminophen, HYDROcodone-acetaminophen, hydrocortisone cream, oxyCODONE Assessment/Plan: The patient is a 52 yo man, history of CAD, HTN, prior diverticulosis s/p partial colectomy, presenting with a diverticular bleed.   # Acute blood loss anemia/Diverticulosis - The patient presented with painless blood per rectum, likely representing diverticular bleed. NM scan showed no bleeding 7/18 or 7/25. Colonoscopy negative 7/19, but showed bleeding without obvious source 7/23. EGD negative 7/19. The patient notes no further bloody bowel movements, though he has not had a bowel movement in several days. -continue to follow CBC daily, transfuse for Hb < 7  -avoid antiplatelet agents  -GI following, greatly appreciate recs  -Surgery following, greatly appreciate recs  -continue protonix  -added psyllium and docusate today  # Perineal abscess - Patient began having daily fevers on 7/23. CXR, blood cultures, and urine culture negative. S/p I&D 7/27 for perineal abscess  -tylenol prn for fever  -zosyn changed to augmentin -change from IV morphine 6 mg q3prn, to Oxycodone 10 mg q3prn -per surgery, sitz baths after BM to keep perineal wound area clean   # CAD - The patient has a history of CAD, with 3 prior stents, most recently 2012 (DES), at [facility]. Patient presented on dual antiplatelet therapy, but after  reviewing guidelines and discussing with the patient's PCP, we decided to discharge the patient home on aspirin monotherapy. Holding all anticoagulants now in the setting of GI bleed.  -hold aspirin until Hb stabilizes  -recommend restarting aspirin monotherapy at discharge   # DM - history of DM, diagnosed in 2004. Pt takes home Lantus 51 units  daily. CBG's have been well-controlled this hospitalization  -continue Lantus 25, with moderate SSI   # HTN - History of HTN, on home lisinopril, atneolol, chlorthalidone  -will restart lisinopril today, can readd other agents as tolerated  Dispo: If patient is able to have a bowel movement, Hb remains stable, and pain is well-controlled with oral medications, patient can likely be discharge home tomorrow.  The patient does have a current PCP (Dr. Hazle Nordmann, [facility]) and does not need an [facility] hospital follow-up appointment after discharge  .Services Needed at time of discharge: Y = Yes, Blank = No PT:   OT:   RN:   Equipment:   Other:     LOS: 14 days   Linward Headland, MD 10/29/2012, 3:32 PM

## 2012-10-30 DIAGNOSIS — L03319 Cellulitis of trunk, unspecified: Secondary | ICD-10-CM

## 2012-10-30 LAB — CBC
Hemoglobin: 8.6 g/dL — ABNORMAL LOW (ref 13.0–17.0)
MCHC: 34.3 g/dL (ref 30.0–36.0)
Platelets: 389 10*3/uL (ref 150–400)
RDW: 15.3 % (ref 11.5–15.5)

## 2012-10-30 LAB — GLUCOSE, CAPILLARY: Glucose-Capillary: 114 mg/dL — ABNORMAL HIGH (ref 70–99)

## 2012-10-30 MED ORDER — PSYLLIUM 95 % PO PACK: 1.0000 | PACK | Freq: Every day | ORAL | Status: DC

## 2012-10-30 MED ORDER — AMOXICILLIN-POT CLAVULANATE 875-125 MG PO TABS: 1.0000 | ORAL_TABLET | Freq: Two times a day (BID) | ORAL | Status: DC

## 2012-10-30 MED ORDER — DSS 100 MG PO CAPS: 100.0000 mg | ORAL_CAPSULE | Freq: Two times a day (BID) | ORAL | Status: DC | PRN

## 2012-10-30 MED ORDER — OXYCODONE HCL 10 MG PO TABS: 10.0000 mg | ORAL_TABLET | Freq: Three times a day (TID) | ORAL | Status: DC | PRN

## 2012-10-30 MED ORDER — HYDROCODONE-ACETAMINOPHEN 5-325 MG PO TABS: 1.0000 | ORAL_TABLET | ORAL | Status: DC | PRN

## 2012-10-30 MED ORDER — CIPROFLOXACIN HCL 500 MG PO TABS: 500.0000 mg | ORAL_TABLET | Freq: Two times a day (BID) | ORAL | Status: DC

## 2012-10-30 NOTE — Discharge Summary (Signed)
Patient Name:  Antonio [name]  MRN: [id number]  PCP: Pcp Not In System  DOB:  07-26-1960       Date of Admission:  10/15/2012  Date of Discharge:  10/30/2012      Attending Physician: Dr. Rocco Serene, MD    Discharge Diagnosis: 1. Acute lower GI bleeding: likely 2/2 diverticulosis and prasugrel. Hb stable at 8.5 at discharge. 2. CAD s/p stent placement x2: stable; continued on aspirin monotherapy at discharge. 3. Perineal abscess: Continue on ciprofloxacin 500mg  BID for 5 days.  4. Diverticulosis: moderate disease in descending colon & mild disease in transverse and ascending colon  5. Type 2 DM: Continued on home medications.  DISPOSITION AND FOLLOW-UP: Antonio [name] was discharged from [facility] in improved condition.  At the hospital follow up visit please address:  1. Perineal abscess wound healing 2. Signs/symptoms of GI bleeding: melena, hematochezia, hematemesis 3. Repeat CBC for Hgb   Follow-up Information   Follow up with Wynona Luna., MD On 11/13/2012. (930am)    Contact information:   [address] [place] [address] [phone]       Schedule an appointment as soon as possible for a visit with Wound Care.   Contact information:   Follow-up with Wound Care Clinic with [facility] [place] about making an appointment[facility]    [phone]      Follow up with RIVIS,LILANA, MD. Schedule an appointment as soon as possible for a visit in 1 week.   Contact information:   Call [phone] ext [phone] (Nurse Antonio) to schedule appointment with Dr. Deatra James.      Discharge Orders   Future Appointments Provider Department Dept Phone   11/13/2012 9:40 AM Wilmon Arms. Tsuei, MD [facility], [place] [phone]   Future Orders Complete By Expires     Call MD for:  redness, tenderness, or signs of infection (pain, swelling, redness, odor or green/yellow discharge around incision site)  As directed     Call MD for:  severe uncontrolled pain  As directed     Call MD for:  temperature >100.4  As directed     Change dressing (specify)  As directed     Comments:      Dressing change: 1 time per day; wet to dry dressing.    Diet - low sodium heart healthy  As directed     Increase activity slowly  As directed        DISCHARGE MEDICATIONS:   Medication List    STOP taking these medications       EFFIENT 10 MG Tabs  Generic drug:  prasugrel      TAKE these medications       aspirin EC 81 MG tablet  Take 81 mg by mouth daily.     atenolol-chlorthalidone 50-25 MG per tablet  Commonly known as:  TENORETIC  Take 1 tablet by mouth daily.     ciprofloxacin 500 MG tablet  Commonly known as:  CIPRO  Take 1 tablet (500 mg total) by mouth 2 (two) times daily.     DSS 100 MG Caps  Take 100 mg by mouth 2 (two) times daily as needed for constipation.     HYDROcodone-acetaminophen 5-325 MG per tablet  Commonly known as:  NORCO/VICODIN  Take 1-2 tablets by mouth every 4 (four) hours as needed for pain.     insulin glargine 100 UNIT/ML injection  Commonly  known as:  LANTUS  Inject 51 Units into the skin at bedtime.     lisinopril 40 MG tablet  Commonly known as:  PRINIVIL,ZESTRIL  Take 40 mg by mouth daily.     metFORMIN 1000 MG tablet  Commonly known as:  GLUCOPHAGE  Take 1,000 mg by mouth 2 (two) times daily with a meal.     Oxycodone HCl 10 MG Tabs  Take 1 tablet (10 mg total) by mouth every 8 (eight) hours as needed for pain (Use 30 minutes before changing dressing).     pantoprazole 40 MG tablet  Commonly known as:  PROTONIX  Take 40 mg by mouth daily as needed. For heartburn     psyllium 95 % Pack  Commonly known as:  HYDROCIL/METAMUCIL  Take 1 packet by mouth daily.         CONSULTS: General surgery and GI    PROCEDURES PERFORMED:  Dg Chest 2 View:  10/23/2012, IMPRESSION:  1.  No active lung disease. 2.  No pneumothorax.  No free air.   Original Report  Authenticated By: Dwyane Dee, M.D.   Dg Abd 1 View:  10/22/2012, IMPRESSION: Surgical clips placed in the transverse colon at the hepatic flexure and mid transverse region.  Nonobstructive bowel gas pattern.   Original Report Authenticated By: Burman Nieves, M.D.   Nm Gi Blood Loss: 10/24/2012,  IMPRESSION: No scintigraphic evidence of active gastrointestinal bleeding.   Original Report Authenticated By: Genevive Bi, M.D.   Nm Gi Blood Loss: 10/17/2012, *RADIOLOGY REPORT*  Clinical Data: Symptomatic and recurrent lower GI bleed  NUCLEAR MEDICINE GASTROINTESTINAL BLEEDING SCAN  Technique:  Sequential abdominal images were obtained following intravenous administration of Tc-11m labeled red blood cells.  Radiopharmaceutical: CURIE ULTRATAG TECHNETIUM TC 73M- LABELED RED BLOOD CELLS IV KIT 27 mCi Tc-49m in-vitro labeled red cells.  Comparison:  CT abdomen pelvis - 07/22/2009  Findings:  Physiologic activity is seen within the heart and major blood vessels of the abdomen. Excreted radiotracer seen within the urinary bladder.  There is no definitive extravascular activity to suggest the presence of acute lower GI bleed.  IMPRESSION: No scintigraphic evidence of acute lower GI bleed.   Original Report Authenticated By: Tacey Ruiz, MD   Dg Chest Port 1 View: 10/16/2012,  IMPRESSION: No acute disease.   Original Report Authenticated By: Holley Dexter, M.D.     ADMISSION DATA:  History of Present Illness: Patient is a 52 yo african [place] male with a PMH of diabetes, CAD (stent placement in 2011), HTN, and diverticulitis which required partial removal of colon.  Patient and his family were traveling from [place] and stopped to use the restroom and he noticed a lot of blood in the toilet when he had a bowel movement.  He subsequently has 2 additional episodes of bloody diarrhea with abdominal pain and became diaphoretic.  His wife called EMS and they were not able to get a radial pulse on him at  the scene.   He denies any fever, chills, N/V, or recent weight loss.  In the ED his initial BP was 80/59 and heart rate was 58.  He was given 1 unit of prbcs in the ED and fluids and his pressure improved.   Physical Exam: Blood pressure 116/64, pulse 91, temperature 100.1 F (37.8 C), temperature source Oral, resp. rate 19, SpO2 100.00%. General: resting in bed in no obvious distress HEENT: PERRL, EOMI, no scleral icterus Cardiac: RRR, no rubs, murmurs or gallops Pulm: clear to  auscultation bilaterally, moving normal volumes of air Abd: obese, soft, nontender, nondistended, BS present Ext: warm and well perfused, no pedal edema Neuro: alert and oriented X3, cranial nerves II-XII grossly intact  Lab results: Basic Metabolic Panel:  Recent Labs   10/15/12 1811   NA  139   K  3.1*   CL  104   CO2  23   GLUCOSE  175*   BUN  13   CREATININE  1.05   CALCIUM  8.2*    Liver Function Tests:  Recent Labs   10/15/12 1811   AST  12   ALT  12   ALKPHOS  34*   BILITOT  0.4   PROT  6.2   ALBUMIN  3.0*    CBC:  Recent Labs   10/15/12 1811   WBC  8.6   HGB  10.6*   HCT  31.1*   MCV  85.7   PLT  177    Cardiac Enzymes:  Recent Labs   10/15/12 1833   TROPONINI  <0.30    Coagulation:  Recent Labs   10/15/12 2030   LABPROT  13.2   INR  1.02      HOSPITAL COURSE:   1. Acute lower GI bleeding: Admitted to ICU with Hb 10.6. Received one dose of prasugrel before it was discontinued; aspirin held. Tagged RBC scan, upper endoscopy, and colonoscopy were negative for acute bleeding. Transferred to floor once stable and transfused blood when Hb dropped below 8 and/or patient had bloody bowel movement for remainder of hospital course. Total transfusion for entire hospital courses equaled 13 units. Transverse colon appeared mildly bloody on repeat colonoscopy but could not isolate source. Repeat tagged RBC scan unremarkable for an acute bleed. Consulted surgery at patient's  request for options but did not recommend intervention. Hb stable at 8.5 for 48 hours prior to discharge. Patient will continue Protonix at discharge. He will be follow up with is his PCP, Dr. Lonzo Candy. We tried to make an appointment with his PCP. We actually spoke with Dr. Deatra James on the phone, but he does not make follow up appointment himself. Dr. Deatra James asked [place] to make follow up appointment throgh his Nurse. Since clinic is closed today, we were unable to make the appointment for patient, but we provided patient with Contact information. Patient agreed to call [phone] ext [phone] (Nurse Antonio) to schedule appointment with Dr. Deatra James in 7 to 10 days.   2.  CAD s/p stent placement x2: stable: Spoke to patient's PCP and prior cardiologist to understand rationale for continuing patient on both aspirin and prasugrel for past 12 months since last stent placement. PCP is in agreement with stopping prasugrel and restarting aspirin as monotherapy. [facility] by them to schedule follow-up appointment in one week.  3. Perineal abscess: Scrotal swelling noted prior to repeat colonscopy. Patient had fevers to 102F the following day which defervesced with Tylenol with WBC trending upwards. Patient noted new perineal pain and swelling found to be the result of an abscess. Surgery performed incision & drainage and recommended changing dressings BID. Pain initially controlled with IV morphine but transitioned to oxycodone and Norco. Empiric Zosyn therapy narrowed to Augmentin following abscess cultures remarkable for Gram-  rods and Gram+ cocci. On day of discharge, Augmentin was switched to ciprofloxacin 500mg  BID for five days given concern of possible Pseudomonas colonization. Discharged to home with health services provided by [facility]. [facility] will call [facility]'s family  to schedule visit with wound care clinic at [facility] in [place], [place] and will follow-up with surgery (Dr.  Corliss Skains) on 11/13/12.  4. Diverticulosis: Moderate disease in descending colon & mild disease in transverse and ascending colon seen on initial colonoscopy. Continued home stool softeners: pysllium, docusate.   5. Type 2 DM: Continued home meds.    DISCHARGE DATA: Vital Signs: BP 146/72  Pulse 67  Temp(Src) 98.3 F (36.8 C) (Oral)  Resp 18  Ht 5\' 7"  (1.702 m)  Wt 204 lb 9.4 oz (92.8 kg)  BMI 32.04 kg/m2  SpO2 99%  Labs: Results for orders placed during the hospital encounter of 10/15/12 (from the past 24 hour(s))  GLUCOSE, CAPILLARY     Status: Abnormal   Collection Time    10/29/12  5:00 PM      Result Value Range   Glucose-Capillary 157 (*) 70 - 99 mg/dL  GLUCOSE, CAPILLARY     Status: Abnormal   Collection Time    10/29/12 10:27 PM      Result Value Range   Glucose-Capillary 182 (*) 70 - 99 mg/dL   Comment 1 Notify RN     Comment 2 Documented in Chart    CBC     Status: Abnormal   Collection Time    10/30/12  5:20 AM      Result Value Range   WBC 9.7  4.0 - 10.5 K/uL   RBC 2.97 (*) 4.22 - 5.81 MIL/uL   Hemoglobin 8.6 (*) 13.0 - 17.0 g/dL   HCT 82.9 (*) 56.2 - 13.0 %   MCV 84.5  78.0 - 100.0 fL   MCH 29.0  26.0 - 34.0 pg   MCHC 34.3  30.0 - 36.0 g/dL   RDW 86.5  78.4 - 69.6 %   Platelets 389  150 - 400 K/uL  GLUCOSE, CAPILLARY     Status: Abnormal   Collection Time    10/30/12  7:41 AM      Result Value Range   Glucose-Capillary 125 (*) 70 - 99 mg/dL  GLUCOSE, CAPILLARY     Status: Abnormal   Collection Time    10/30/12 12:05 PM      Result Value Range   Glucose-Capillary 199 (*) 70 - 99 mg/dL     Services Ordered on Discharge: Y = Yes; Blank = No PT:   OT:   RN:   Equipment:   Other: Home health     Time Spent on Discharge: 35 min  Signed: Lorretta Harp, MD PGY 2, Internal Medicine Resident 10/30/2012, 4:33 PM

## 2012-10-30 NOTE — Progress Notes (Signed)
Antonio [name] discharged Home per MD order.  Discharge instructions reviewed and discussed with the patient and pt. wife, all questions and concerns answered. Copy of instructions and scripts given to patient.    Medication List    STOP taking these medications       EFFIENT 10 MG Tabs  Generic drug:  prasugrel      TAKE these medications       aspirin EC 81 MG tablet  Take 81 mg by mouth daily.     atenolol-chlorthalidone 50-25 MG per tablet  Commonly known as:  TENORETIC  Take 1 tablet by mouth daily.     ciprofloxacin 500 MG tablet  Commonly known as:  CIPRO  Take 1 tablet (500 mg total) by mouth 2 (two) times daily.     DSS 100 MG Caps  Take 100 mg by mouth 2 (two) times daily as needed for constipation.     HYDROcodone-acetaminophen 5-325 MG per tablet  Commonly known as:  NORCO/VICODIN  Take 1-2 tablets by mouth every 4 (four) hours as needed for pain.     insulin glargine 100 UNIT/ML injection  Commonly known as:  LANTUS  Inject 51 Units into the skin at bedtime.     lisinopril 40 MG tablet  Commonly known as:  PRINIVIL,ZESTRIL  Take 40 mg by mouth daily.     metFORMIN 1000 MG tablet  Commonly known as:  GLUCOPHAGE  Take 1,000 mg by mouth 2 (two) times daily with a meal.     Oxycodone HCl 10 MG Tabs  Take 1 tablet (10 mg total) by mouth every 8 (eight) hours as needed for pain (Use 30 minutes before changing dressing).     pantoprazole 40 MG tablet  Commonly known as:  PROTONIX  Take 40 mg by mouth daily as needed. For heartburn     psyllium 95 % Pack  Commonly known as:  HYDROCIL/METAMUCIL  Take 1 packet by mouth daily.        Patients skin is clean, dry and with perirectal surgical incision CDI. IV site discontinued and catheter remains intact. Site without signs and symptoms of complications. Dressing and pressure applied.  Patient escorted to car by NT in a wheelchair,  no distress noted upon discharge.  [name], [name] C 10/30/2012 5:30 PM

## 2012-10-30 NOTE — Progress Notes (Signed)
  Subjective: Sister at bedside this AM. No complaints. Overnight, had BM with some blood in it, likely related to wound given stable Hb from day prior (8.6 vs. 8.5). Tolerated oral pain medication with wound care yesterday. Restarted home lisinopril yesterday given elevated BP.  Per case manager, still awaiting confirmation of home health and wound care services.   Objective: Vital signs in last 24 hours: Filed Vitals:   10/29/12 1355 10/29/12 2023 10/30/12 0519 10/30/12 1035  BP: 156/84 157/83 130/79 146/72  Pulse: 64 71 67   Temp: 98.2 F (36.8 C) 98.3 F (36.8 C) 98.3 F (36.8 C)   TempSrc: Oral Oral Oral   Resp: 18 20 18    Height:      Weight:   92.8 kg (204 lb 9.4 oz)   SpO2: 100% 100% 99%    Physical Exam General: alert, cooperative, and in no apparent distress HEENT: pupils equal round and reactive to light, vision grossly intact, oropharynx clear and non-erythematous  Heart: regular rate and rhythm, early systolic murmur grade II/VI at LUSB  Lungs: clear to auscultation bilaterally Abdomen: soft, non-tender, non-distended, normal bowel sounds Extremities: 2+ DP/PT pulses bilaterally, no cyanosis, clubbing, or edema Neurologic: alert & oriented X3, cranial nerves II-XII intact, strength grossly intact, sensation intact to light touch  Lab Results: CBC    Component Value Date/Time   WBC 9.7 10/30/2012 0520   RBC 2.97* 10/30/2012 0520   RBC 2.86* 10/23/2012 1422   HGB 8.6* 10/30/2012 0520   HCT 25.1* 10/30/2012 0520   PLT 389 10/30/2012 0520   MCV 84.5 10/30/2012 0520   MCH 29.0 10/30/2012 0520   MCHC 34.3 10/30/2012 0520   RDW 15.3 10/30/2012 0520   LYMPHSABS 2.4 10/19/2012 0515   MONOABS 0.8 10/19/2012 0515   EOSABS 0.1 10/19/2012 0515   BASOSABS 0.0 10/19/2012 0515   Medications: I have reviewed the patient's current medications. Scheduled Meds: . amoxicillin-clavulanate  1 tablet Oral Q12H  . docusate sodium  100 mg Oral BID  . insulin aspart  0-15 Units Subcutaneous  TID WC  . insulin glargine  25 Units Subcutaneous QHS  . lisinopril  40 mg Oral Daily  . pantoprazole  40 mg Oral QHS  . psyllium  1 packet Oral Daily   PRN Meds:.acetaminophen, HYDROcodone-acetaminophen, hydrocortisone cream, oxyCODONE  Assessment/Plan: 52 year old male veteran w/ diverticulosis s/p bowel resection, anemia, CAD s/p stent placements, DM2 initially admitted for acute lower GI bleed likely 2/2 diverticular bleeding and medication now 5 days s/p incision & drainage of perineal abscess.  #S/p incision & drainage of perineal abscess: Continue BID dressing changes for wound -Continue oxycodone 10mg  q3h prn for pain -Continue stool softeners: docusate & psyllium -Surgery following and appreciate recs  #Lower GI bleeding: pRBCs x13 since admission. -Advised patient to monitor for bloody BM and symptoms. -Continue pantoprazole for GI prophylaxis.  -GI & surgery following and appreciate recs  #CAD s/p stent placements: Need to contact PCP about our plan. Will restart aspirin monotherapy on discharge.   #DM2: Continue CBG TID.   #Disposition: Stable for discharge today pending arrangement of home health & wound care.   This is a Psychologist, occupational Note.  The care of the patient was discussed with Dr. Melanie Crazier and the assessment and plan formulated with their assistance.  Please see their attached note for official documentation of the daily encounter.   LOS: 15 days   Beather Arbour, Med Student 10/30/2012, 11:06 AM

## 2012-10-30 NOTE — Progress Notes (Signed)
Changed dressing as ordered, had blueish green drainage with an odor.  Barnetta Chapel paged and return called.  Informed of drainage color and odor.  Stated it might be pseudomonas and need to go home with Cipro.  She will contact the primary team to inform them.  Will continue to monitor.  Forbes Cellar, RN

## 2012-10-30 NOTE — Progress Notes (Signed)
I have seen the patient and reviewed the daily progress note by medical student, Tresa Garter and discussed the care of the patient with them. Please see my note for documentation of my findings, assessment, and plans  Lorretta Harp, MD PGY3, Internal Medicine Teaching Service Pager: [phone]

## 2012-10-30 NOTE — Progress Notes (Signed)
Internal Medicine Attending  Date: 10/30/2012  Patient name: Antonio [name] Medical record number: [id number] Date of birth: [birth year] Age: 52 y.o. Gender: male  I saw and evaluated the patient. I reviewed the resident's note by Mr. Patel/Dr. Clyde Lundborg and I agree with the student's/resident's findings and plans as documented in their progress notes.  Mr. Bernards felt well this AM.  Small amount of blood in stool likely from wound.  Hgb has been stable over last 24 hours.  It looks like surgery would like Mr. Gluth sent home on Cipro and this will be done.  Once [facility] he will be ready for discharge today.

## 2012-10-30 NOTE — Progress Notes (Signed)
Subjective:  Patient feels better and is ready to go home. He had bowel movement with a little blood in it. No nausea, vomiting or abdominal pain. His Hgb has been stable. 8.5-->8.6. Marland Kitchen Home health service issue has finally resolved. No fever or chills.   Objective: Vital signs in last 24 hours: Filed Vitals:   10/29/12 1355 10/29/12 2023 10/30/12 0519 10/30/12 1035  BP: 156/84 157/83 130/79 146/72  Pulse: 64 71 67   Temp: 98.2 F (36.8 C) 98.3 F (36.8 C) 98.3 F (36.8 C)   TempSrc: Oral Oral Oral   Resp: 18 20 18    Height:      Weight:   204 lb 9.4 oz (92.8 kg)   SpO2: 100% 100% 99%    Weight change: -2 lb 13.9 oz (-1.3 kg)  Intake/Output Summary (Last 24 hours) at 10/30/12 1539 Last data filed at 10/30/12 0930  Gross per 24 hour  Intake    480 ml  Output    751 ml  Net   -271 ml    Physical Exam:   Filed Vitals:   10/29/12 1355 10/29/12 2023 10/30/12 0519 10/30/12 1035  BP: 156/84 157/83 130/79 146/72  Pulse: 64 71 67   Temp: 98.2 F (36.8 C) 98.3 F (36.8 C) 98.3 F (36.8 C)   TempSrc: Oral Oral Oral   Resp: 18 20 18    Height:      Weight:   204 lb 9.4 oz (92.8 kg)   SpO2: 100% 100% 99%     General: Not in acute distress.  HEENT: PERRL, EOMI, no scleral icterus, No JVD or bruit Cardiac: S1/S2, RRR, No murmurs, gallops or rubs Pulm: Good air movement bilaterally. Clear to auscultation bilaterally. No rales, wheezing, rhonchi or rubs. Abd: Soft, nondistended, nontender, no rebound pain, no organomegaly, BS present Ext: No edema. 2+DP/PT pulse bilaterally GU: Packing in place at site of perineal I&D  Skin: No rashes.  Neuro: Alert and oriented X3, cranial nerves II-XII grossly intact, muscle strength 5/5 in all extremeties, sensation to light touch intact.   Lab Results: Basic Metabolic Panel:  Recent Labs Lab 10/24/12 1918 10/26/12 0637  NA 134* 134*  K 3.2* 3.4*  CL 101 101  CO2 22 22  GLUCOSE 164* 150*  BUN 13 10  CREATININE 0.98 0.98  CALCIUM  8.3* 8.2*   Liver Function Tests:  Recent Labs Lab 10/24/12 1918  AST 15  ALT 10  ALKPHOS 40  BILITOT 0.5  PROT 5.9*  ALBUMIN 2.4*   No results found for this basename: LIPASE, AMYLASE,  in the last 168 hours No results found for this basename: AMMONIA,  in the last 168 hours CBC:  Recent Labs Lab 10/29/12 0610 10/30/12 0520  WBC 10.6* 9.7  HGB 8.5* 8.6*  HCT 25.4* 25.1*  MCV 84.7 84.5  PLT 351 389   Cardiac Enzymes: No results found for this basename: CKTOTAL, CKMB, CKMBINDEX, TROPONINI,  in the last 168 hours BNP: No results found for this basename: PROBNP,  in the last 168 hours D-Dimer: No results found for this basename: DDIMER,  in the last 168 hours CBG:  Recent Labs Lab 10/29/12 0758 10/29/12 1242 10/29/12 1700 10/29/12 2227 10/30/12 0741 10/30/12 1205  GLUCAP 109* 170* 157* 182* 125* 199*   Hemoglobin A1C: No results found for this basename: HGBA1C,  in the last 168 hours Fasting Lipid Panel: No results found for this basename: CHOL, HDL, LDLCALC, TRIG, CHOLHDL, LDLDIRECT,  in the last 168 hours Thyroid  Function Tests: No results found for this basename: TSH, T4TOTAL, FREET4, T3FREE, THYROIDAB,  in the last 168 hours Coagulation: No results found for this basename: LABPROT, INR,  in the last 168 hours Anemia Panel: No results found for this basename: VITAMINB12, FOLATE, FERRITIN, TIBC, IRON, RETICCTPCT,  in the last 168 hours Urine Drug Screen: Drugs of Abuse  No results found for this basename: labopia, cocainscrnur, labbenz, amphetmu, thcu, labbarb    Alcohol Level: No results found for this basename: ETH,  in the last 168 hours Urinalysis: No results found for this basename: COLORURINE, APPERANCEUR, LABSPEC, PHURINE, GLUCOSEU, HGBUR, BILIRUBINUR, KETONESUR, PROTEINUR, UROBILINOGEN, NITRITE, LEUKOCYTESUR,  in the last 168 hours Misc. Labs:    Micro Results: Recent Results (from the past 240 hour(s))  URINE CULTURE     Status: None    Collection Time    10/23/12  2:49 AM      Result Value Range Status   Specimen Description URINE, CLEAN CATCH   Final   Special Requests NONE   Final   Culture  Setup Time 10/23/2012 08:58   Final   Colony Count 10,000 COLONIES/ML   Final   Culture     Final   Value: Multiple bacterial morphotypes present, none predominant. Suggest appropriate recollection if clinically indicated.   Report Status 10/24/2012 FINAL   Final  CULTURE, BLOOD (ROUTINE X 2)     Status: None   Collection Time    10/23/12  3:25 AM      Result Value Range Status   Specimen Description BLOOD LEFT ARM   Final   Special Requests BOTTLES DRAWN AEROBIC ONLY 2.5CC   Final   Culture  Setup Time 10/23/2012 08:54   Final   Culture NO GROWTH 5 DAYS   Final   Report Status 10/29/2012 FINAL   Final  CULTURE, BLOOD (ROUTINE X 2)     Status: None   Collection Time    10/23/12  3:35 AM      Result Value Range Status   Specimen Description BLOOD RIGHT ARM   Final   Special Requests BOTTLES DRAWN AEROBIC ONLY 5CC   Final   Culture  Setup Time 10/23/2012 08:54   Final   Culture NO GROWTH 5 DAYS   Final   Report Status 10/29/2012 FINAL   Final  SURGICAL PCR SCREEN     Status: None   Collection Time    10/25/12 11:45 AM      Result Value Range Status   MRSA, PCR NEGATIVE  NEGATIVE Final   Staphylococcus aureus NEGATIVE  NEGATIVE Final   Comment:            The Xpert SA Assay (FDA     approved for NASAL specimens     in patients over 53 years of age),     is one component of     a comprehensive surveillance     program.  Test performance has     been validated by [employer] for patients greater     than or equal to 19 year old.     It is not intended     to diagnose infection nor to     guide or monitor treatment.  ANAEROBIC CULTURE     Status: None   Collection Time    10/26/12 11:41 AM      Result Value Range Status   Specimen Description ABSCESS PERINEAL   Final   Special Requests PATIENT ON  FOLLOWING  ZOSYN   Final   Gram Stain     Final   Value: RARE WBC PRESENT, PREDOMINANTLY PMN     NO SQUAMOUS EPITHELIAL CELLS SEEN     FEW GRAM NEGATIVE RODS     RARE GRAM POSITIVE COCCI IN PAIRS   Culture     Final   Value: NO ANAEROBES ISOLATED; CULTURE IN PROGRESS FOR 5 DAYS   Report Status PENDING   Incomplete  CULTURE, ROUTINE-ABSCESS     Status: None   Collection Time    10/26/12 11:41 AM      Result Value Range Status   Specimen Description ABSCESS PERINEAL   Final   Special Requests PATIENT ON FOLLOWING ZOSYN   Final   Gram Stain     Final   Value: FEW WBC PRESENT, PREDOMINANTLY PMN     NO SQUAMOUS EPITHELIAL CELLS SEEN     FEW GRAM NEGATIVE RODS     RARE GRAM POSITIVE COCCI IN PAIRS   Culture     Final   Value: MULTIPLE ORGANISMS PRESENT, NONE PREDOMINANT     Note: NO STAPHYLOCOCCUS AUREUS ISOLATED NO GROUP A STREP (S.PYOGENES) ISOLATED   Report Status 10/29/2012 FINAL   Final   Studies/Results: No results found. Medications:  Scheduled Meds: . amoxicillin-clavulanate  1 tablet Oral Q12H  . docusate sodium  100 mg Oral BID  . insulin aspart  0-15 Units Subcutaneous TID WC  . insulin glargine  25 Units Subcutaneous QHS  . lisinopril  40 mg Oral Daily  . pantoprazole  40 mg Oral QHS  . psyllium  1 packet Oral Daily   Continuous Infusions:  PRN Meds:.acetaminophen, HYDROcodone-acetaminophen, hydrocortisone cream, oxyCODONE Assessment/Plan:  52 year old male veteran w/ diverticulosis s/p bowel resection, anemia, CAD s/p stent placements, DM2 initially admitted for acute lower GI bleed likely 2/2 diverticular bleeding and medication now 5 days s/p incision & drainage of perineal abscess. Patient's hgb is stable. No fever or chills.   # Perineal abscess: 5 days of s/p of incision & drainage. Patient is pain is well controlled. Patient was intially treated with  Zosyn empirically and then switched to Augmentin. No fever or chills. Patient is ready to be discharged today. Per  surgeon, augmentin will be switched to ciprofloxacin 500mg  BID for five days given concern of possible Pseudomonas colonization. Patient will be followed up by surgeon on 8/14. Patient will have daily dressing change after being discharged. [facility] be [facility]. [facility]'s family to schedule visit with wound care clinic at [facility] in [place], [place]. pateint will be given pain medication prescriptions (oxycodone and Norco).   # Lower GI bleeding: pRBCs x13 since admission. patient's Hgb is stable. He had BM last night with little blood in the stool which is most likely related to wound given the stable Hgb. Will discontinue Effient and start ASA at discharge. Will continue Protonix at discharge.    Recent Labs Lab 10/26/12 1923 10/27/12 0817 10/27/12 2018 10/29/12 0610 10/30/12 0520  HGB 7.2* 7.3* 6.6* 8.5* 8.6*   #CAD s/p stent placements: Need to contact PCP about our plan. Will restart aspirin monotherapy at discharge and d/c Effient.    #DM2: will continue his home metformin, lantus 51 U at bedtime at discharge.   #Disposition: will be discharged home today. Will have home health & wound care service. Patient will be followed up by surgeon on 8/14.    LOS: 15 days  Lorretta Harp 10/30/2012, 3:39 PM

## 2012-10-30 NOTE — Progress Notes (Signed)
Keiyana Stehr, MD, MPH, FACS Pager: [phone]  

## 2012-10-30 NOTE — Progress Notes (Signed)
Patient ID: Antonio [name], male   DOB: 1961/03/10, 52 y.o.   MRN: [id number] Patient tolerated his dressing change with just pain meds ok yesterday.  He is stable for dc home today from our standpoint.  Maybe 5 more days of oral augmentin would be sufficient.  He is to follow up with Dr. Corliss Skains in our office in 1-2 weeks.  Home health as been arranged for the patient.  Kali Ambler E 7:40 AM 10/30/2012

## 2012-10-31 LAB — ANAEROBIC CULTURE

## 2012-11-10 ENCOUNTER — Encounter (INDEPENDENT_AMBULATORY_CARE_PROVIDER_SITE_OTHER): Payer: Self-pay | Admitting: Surgery

## 2012-11-13 ENCOUNTER — Encounter (INDEPENDENT_AMBULATORY_CARE_PROVIDER_SITE_OTHER): Payer: Self-pay | Admitting: Surgery

## 2012-11-13 ENCOUNTER — Ambulatory Visit (INDEPENDENT_AMBULATORY_CARE_PROVIDER_SITE_OTHER): Payer: Non-veteran care | Admitting: Surgery

## 2012-11-13 VITALS — BP 126/72 | HR 68 | Resp 16 | Ht 67.0 in | Wt 194.0 lb

## 2012-11-13 DIAGNOSIS — L02219 Cutaneous abscess of trunk, unspecified: Secondary | ICD-10-CM

## 2012-11-13 DIAGNOSIS — L02215 Cutaneous abscess of perineum: Secondary | ICD-10-CM

## 2012-11-13 NOTE — Progress Notes (Signed)
The patient is 3 weeks status post debridement of a large perineal abscess. His wife has been performing daily dressing changes. He has not had any further GI bleeding.  On examination his wound is very well granulated in very clean. It is about 5 cm long and about 2 cm wide. Continue with dressing changes until the wound is completely filled. Recheck in 2-3 weeks.  Wilmon Arms. Corliss Skains, MD, [facility] Surgery  General/ Trauma Surgery  11/13/2012 10:28 AM

## 2012-11-19 ENCOUNTER — Telehealth (INDEPENDENT_AMBULATORY_CARE_PROVIDER_SITE_OTHER): Payer: Self-pay

## 2012-11-19 DIAGNOSIS — L02215 Cutaneous abscess of perineum: Secondary | ICD-10-CM

## 2012-11-19 MED ORDER — OXYCODONE HCL 10 MG PO TABS: 10.0000 mg | ORAL_TABLET | Freq: Three times a day (TID) | ORAL | Status: DC | PRN

## 2012-11-19 NOTE — Telephone Encounter (Signed)
Called pt and let him know prescription is at front desk for pick up.

## 2012-11-19 NOTE — Telephone Encounter (Signed)
OK to refill.  Have somebody else write it.

## 2012-11-19 NOTE — Addendum Note (Signed)
Addended by: Brennan Bailey on: 11/19/2012 01:48 PM   Modules accepted: Orders

## 2012-11-19 NOTE — Telephone Encounter (Signed)
Patient called in requesting refill of oxycodone 10mg . States he takes them right before dressing changes. Patient was offered norco protocol but request oxycodone refill. Pt was told we will send Dr Corliss Skains a note for approval and we will get back in touch with him regarding refill.

## 2012-12-05 ENCOUNTER — Encounter (INDEPENDENT_AMBULATORY_CARE_PROVIDER_SITE_OTHER): Payer: Self-pay | Admitting: Surgery

## 2012-12-05 ENCOUNTER — Ambulatory Visit (INDEPENDENT_AMBULATORY_CARE_PROVIDER_SITE_OTHER): Payer: Non-veteran care | Admitting: Surgery

## 2012-12-05 VITALS — BP 130/79 | HR 64 | Temp 97.6°F | Resp 14 | Ht 67.0 in | Wt 201.2 lb

## 2012-12-05 DIAGNOSIS — L02215 Cutaneous abscess of perineum: Secondary | ICD-10-CM

## 2012-12-05 DIAGNOSIS — L02219 Cutaneous abscess of trunk, unspecified: Secondary | ICD-10-CM

## 2012-12-05 NOTE — Progress Notes (Signed)
The patient comes in for a wound check of his perineal wound. The wound is almost completely healed. It is very clean and the granulation tissue has completely filled the wound. It is about 3 cm long and only about 7 mm wide. He should treat this area with Neosporin and a dry dressing until completely healed. Followup as needed.   Wilmon Arms. Corliss Skains, MD, [facility] Surgery  General/ Trauma Surgery  12/05/2012 11:43 AM

## 2015-12-22 ENCOUNTER — Emergency Department (HOSPITAL_BASED_OUTPATIENT_CLINIC_OR_DEPARTMENT_OTHER): Payer: Non-veteran care

## 2015-12-22 ENCOUNTER — Inpatient Hospital Stay (HOSPITAL_BASED_OUTPATIENT_CLINIC_OR_DEPARTMENT_OTHER)
Admission: EM | Admit: 2015-12-22 | Discharge: 2015-12-24 | DRG: 247 | Disposition: A | Discharge status: Home / Self Care | Payer: Non-veteran care | Attending: Cardiology | Admitting: Cardiology

## 2015-12-22 ENCOUNTER — Encounter (HOSPITAL_BASED_OUTPATIENT_CLINIC_OR_DEPARTMENT_OTHER): Payer: Self-pay | Admitting: *Deleted

## 2015-12-22 DIAGNOSIS — E876 Hypokalemia: Secondary | ICD-10-CM | POA: Diagnosis present

## 2015-12-22 DIAGNOSIS — E785 Hyperlipidemia, unspecified: Secondary | ICD-10-CM | POA: Diagnosis not present

## 2015-12-22 DIAGNOSIS — Y92238 Other place in hospital as the place of occurrence of the external cause: Secondary | ICD-10-CM | POA: Diagnosis not present

## 2015-12-22 DIAGNOSIS — R079 Chest pain, unspecified: Secondary | ICD-10-CM | POA: Diagnosis present

## 2015-12-22 DIAGNOSIS — Z79899 Other long term (current) drug therapy: Secondary | ICD-10-CM

## 2015-12-22 DIAGNOSIS — I10 Essential (primary) hypertension: Secondary | ICD-10-CM | POA: Diagnosis present

## 2015-12-22 DIAGNOSIS — Z87891 Personal history of nicotine dependence: Secondary | ICD-10-CM

## 2015-12-22 DIAGNOSIS — K219 Gastro-esophageal reflux disease without esophagitis: Secondary | ICD-10-CM | POA: Diagnosis not present

## 2015-12-22 DIAGNOSIS — Z7982 Long term (current) use of aspirin: Secondary | ICD-10-CM | POA: Diagnosis not present

## 2015-12-22 DIAGNOSIS — E782 Mixed hyperlipidemia: Secondary | ICD-10-CM | POA: Diagnosis present

## 2015-12-22 DIAGNOSIS — E119 Type 2 diabetes mellitus without complications: Secondary | ICD-10-CM

## 2015-12-22 DIAGNOSIS — Z794 Long term (current) use of insulin: Secondary | ICD-10-CM | POA: Diagnosis not present

## 2015-12-22 DIAGNOSIS — T50995A Adverse effect of other drugs, medicaments and biological substances, initial encounter: Secondary | ICD-10-CM | POA: Diagnosis not present

## 2015-12-22 DIAGNOSIS — I214 Non-ST elevation (NSTEMI) myocardial infarction: Secondary | ICD-10-CM | POA: Diagnosis not present

## 2015-12-22 DIAGNOSIS — E1169 Type 2 diabetes mellitus with other specified complication: Secondary | ICD-10-CM | POA: Diagnosis present

## 2015-12-22 DIAGNOSIS — Z955 Presence of coronary angioplasty implant and graft: Secondary | ICD-10-CM

## 2015-12-22 DIAGNOSIS — R001 Bradycardia, unspecified: Secondary | ICD-10-CM | POA: Diagnosis not present

## 2015-12-22 DIAGNOSIS — R0602 Shortness of breath: Secondary | ICD-10-CM | POA: Diagnosis not present

## 2015-12-22 DIAGNOSIS — Z8249 Family history of ischemic heart disease and other diseases of the circulatory system: Secondary | ICD-10-CM | POA: Diagnosis not present

## 2015-12-22 DIAGNOSIS — I252 Old myocardial infarction: Secondary | ICD-10-CM | POA: Diagnosis not present

## 2015-12-22 DIAGNOSIS — I2510 Atherosclerotic heart disease of native coronary artery without angina pectoris: Secondary | ICD-10-CM | POA: Diagnosis present

## 2015-12-22 HISTORY — DX: Acute myocardial infarction, unspecified: I21.9

## 2015-12-22 HISTORY — DX: Hyperlipidemia, unspecified: E78.5

## 2015-12-22 HISTORY — DX: Hyperlipidemia, unspecified: E11.69

## 2015-12-22 LAB — DIFFERENTIAL
BASOS PCT: 0 %
Basophils Absolute: 0 10*3/uL (ref 0.0–0.1)
EOS ABS: 0 10*3/uL (ref 0.0–0.7)
Eosinophils Relative: 0 %
Lymphocytes Relative: 32 %
Lymphs Abs: 2.1 10*3/uL (ref 0.7–4.0)
MONO ABS: 0.7 10*3/uL (ref 0.1–1.0)
MONOS PCT: 10 %
Neutro Abs: 3.7 10*3/uL (ref 1.7–7.7)
Neutrophils Relative %: 58 %

## 2015-12-22 LAB — LIPID PANEL
Cholesterol: 197 mg/dL (ref 0–200)
HDL: 39 mg/dL — AB (ref >40)
LDL Cholesterol: 120 mg/dL — ABNORMAL HIGH (ref 0–99)
Total CHOL/HDL Ratio: 5.1 RATIO
Triglycerides: 192 mg/dL — ABNORMAL HIGH (ref <150)
VLDL: 38 mg/dL (ref 0–40)

## 2015-12-22 LAB — COMPREHENSIVE METABOLIC PANEL
ALK PHOS: 80 U/L (ref 38–126)
ALT: 13 U/L — AB (ref 17–63)
AST: 17 U/L (ref 15–41)
Albumin: 4.1 g/dL (ref 3.5–5.0)
Anion gap: 10 (ref 5–15)
BUN: 9 mg/dL (ref 6–20)
CALCIUM: 9.2 mg/dL (ref 8.9–10.3)
CHLORIDE: 101 mmol/L (ref 101–111)
CO2: 25 mmol/L (ref 22–32)
CREATININE: 0.83 mg/dL (ref 0.61–1.24)
GLUCOSE: 303 mg/dL — AB (ref 65–99)
Potassium: 3.4 mmol/L — ABNORMAL LOW (ref 3.5–5.1)
Sodium: 136 mmol/L (ref 135–145)
Total Bilirubin: 0.9 mg/dL (ref 0.3–1.2)
Total Protein: 7.7 g/dL (ref 6.5–8.1)

## 2015-12-22 LAB — PROTIME-INR
INR: 1.02
INR: 1.06
Prothrombin Time: 13.4 seconds (ref 11.4–15.2)
Prothrombin Time: 13.8 seconds (ref 11.4–15.2)

## 2015-12-22 LAB — CBC
HEMATOCRIT: 37.6 % — AB (ref 39.0–52.0)
Hemoglobin: 13 g/dL (ref 13.0–17.0)
MCH: 27.7 pg (ref 26.0–34.0)
MCHC: 34.6 g/dL (ref 30.0–36.0)
MCV: 80.2 fL (ref 78.0–100.0)
Platelets: 240 10*3/uL (ref 150–400)
RBC: 4.69 MIL/uL (ref 4.22–5.81)
RDW: 14.9 % (ref 11.5–15.5)
WBC: 6.5 10*3/uL (ref 4.0–10.5)

## 2015-12-22 LAB — GLUCOSE, CAPILLARY
GLUCOSE-CAPILLARY: 306 mg/dL — AB (ref 65–99)
Glucose-Capillary: 325 mg/dL — ABNORMAL HIGH (ref 65–99)

## 2015-12-22 LAB — TROPONIN I
TROPONIN I: 0.24 ng/mL — AB (ref <0.03)
Troponin I: 0.4 ng/mL (ref <0.03)

## 2015-12-22 LAB — HEPARIN LEVEL (UNFRACTIONATED): HEPARIN UNFRACTIONATED: 0.49 [IU]/mL (ref 0.30–0.70)

## 2015-12-22 LAB — APTT: aPTT: 24 seconds (ref 24–36)

## 2015-12-22 MED ORDER — SODIUM CHLORIDE 0.9 % IV SOLN: 250.0000 mL | INTRAVENOUS | Status: DC | PRN

## 2015-12-22 MED ORDER — SODIUM CHLORIDE 0.9 % WEIGHT BASED INFUSION
3.0000 mL/kg/h | INTRAVENOUS | Status: DC
  Administered 2015-12-23: 3 mL/kg/h via INTRAVENOUS

## 2015-12-22 MED ORDER — METOPROLOL TARTRATE 25 MG PO TABS
25.0000 mg | ORAL_TABLET | Freq: Two times a day (BID) | ORAL | Status: DC
  Administered 2015-12-22 – 2015-12-23 (×2): 25 mg via ORAL
  Filled 2015-12-22 (×2): qty 1

## 2015-12-22 MED ORDER — PANTOPRAZOLE SODIUM 40 MG PO TBEC
40.0000 mg | DELAYED_RELEASE_TABLET | Freq: Two times a day (BID) | ORAL | Status: DC
  Administered 2015-12-22 – 2015-12-24 (×4): 40 mg via ORAL
  Filled 2015-12-22 (×4): qty 1

## 2015-12-22 MED ORDER — NITROGLYCERIN 2 % TD OINT
1.0000 [in_us] | TOPICAL_OINTMENT | Freq: Once | TRANSDERMAL | Status: AC
Start: 2015-12-22 — End: 2015-12-22
  Administered 2015-12-22: 1 [in_us] via TOPICAL
  Filled 2015-12-22: qty 1

## 2015-12-22 MED ORDER — SODIUM CHLORIDE 0.9% FLUSH
3.0000 mL | Freq: Two times a day (BID) | INTRAVENOUS | Status: DC
  Administered 2015-12-22: 3 mL via INTRAVENOUS

## 2015-12-22 MED ORDER — ONDANSETRON HCL 4 MG/2ML IJ SOLN: 4.0000 mg | Freq: Four times a day (QID) | INTRAMUSCULAR | Status: DC | PRN

## 2015-12-22 MED ORDER — NITROGLYCERIN 0.4 MG SL SUBL
SUBLINGUAL_TABLET | SUBLINGUAL | Status: AC
  Administered 2015-12-22: 0.4 mg via SUBLINGUAL
  Filled 2015-12-22: qty 3

## 2015-12-22 MED ORDER — INSULIN ASPART PROT & ASPART (70-30 MIX) 100 UNIT/ML ~~LOC~~ SUSP
30.0000 [IU] | Freq: Two times a day (BID) | SUBCUTANEOUS | Status: DC
  Administered 2015-12-22 – 2015-12-24 (×3): 30 [IU] via SUBCUTANEOUS
  Filled 2015-12-22 (×2): qty 10

## 2015-12-22 MED ORDER — ASPIRIN 81 MG PO CHEW
324.0000 mg | CHEWABLE_TABLET | Freq: Once | ORAL | Status: AC
  Administered 2015-12-22: 324 mg via ORAL
  Filled 2015-12-22: qty 4

## 2015-12-22 MED ORDER — INSULIN ASPART 100 UNIT/ML ~~LOC~~ SOLN
0.0000 [IU] | Freq: Every day | SUBCUTANEOUS | Status: DC
Start: 2015-12-22 — End: 2015-12-24
  Administered 2015-12-22: 4 [IU] via SUBCUTANEOUS
  Administered 2015-12-23: 22:00:00 3 [IU] via SUBCUTANEOUS

## 2015-12-22 MED ORDER — SODIUM CHLORIDE 0.9% FLUSH: 3.0000 mL | INTRAVENOUS | Status: DC | PRN

## 2015-12-22 MED ORDER — HEPARIN (PORCINE) IN NACL 100-0.45 UNIT/ML-% IJ SOLN
1100.0000 [IU]/h | INTRAMUSCULAR | Status: DC
  Administered 2015-12-22 – 2015-12-23 (×2): 1100 [IU]/h via INTRAVENOUS
  Filled 2015-12-22 (×2): qty 250

## 2015-12-22 MED ORDER — ATORVASTATIN CALCIUM 80 MG PO TABS
80.0000 mg | ORAL_TABLET | Freq: Every day | ORAL | Status: DC
  Administered 2015-12-22 – 2015-12-23 (×2): 80 mg via ORAL
  Filled 2015-12-22 (×2): qty 1

## 2015-12-22 MED ORDER — ALPRAZOLAM 0.25 MG PO TABS: 0.2500 mg | ORAL_TABLET | Freq: Two times a day (BID) | ORAL | Status: DC | PRN

## 2015-12-22 MED ORDER — NITROGLYCERIN 0.4 MG SL SUBL
0.4000 mg | SUBLINGUAL_TABLET | SUBLINGUAL | Status: DC | PRN
  Administered 2015-12-22: 0.4 mg via SUBLINGUAL

## 2015-12-22 MED ORDER — ZOLPIDEM TARTRATE 5 MG PO TABS
5.0000 mg | ORAL_TABLET | Freq: Every evening | ORAL | Status: DC | PRN
  Administered 2015-12-22: 5 mg via ORAL
  Filled 2015-12-22: qty 1

## 2015-12-22 MED ORDER — ASPIRIN EC 81 MG PO TBEC
162.0000 mg | DELAYED_RELEASE_TABLET | Freq: Two times a day (BID) | ORAL | Status: DC
  Administered 2015-12-22 – 2015-12-23 (×2): 162 mg via ORAL
  Filled 2015-12-22 (×2): qty 2

## 2015-12-22 MED ORDER — INSULIN ASPART 100 UNIT/ML ~~LOC~~ SOLN: 0.0000 [IU] | Freq: Three times a day (TID) | SUBCUTANEOUS | Status: DC

## 2015-12-22 MED ORDER — LISINOPRIL 40 MG PO TABS
40.0000 mg | ORAL_TABLET | Freq: Every day | ORAL | Status: DC
  Administered 2015-12-22 – 2015-12-24 (×3): 40 mg via ORAL
  Filled 2015-12-22 (×3): qty 1

## 2015-12-22 MED ORDER — INSULIN ASPART 100 UNIT/ML ~~LOC~~ SOLN
0.0000 [IU] | Freq: Three times a day (TID) | SUBCUTANEOUS | Status: DC
Start: 2015-12-23 — End: 2015-12-24
  Administered 2015-12-23 – 2015-12-24 (×2): 3 [IU] via SUBCUTANEOUS

## 2015-12-22 MED ORDER — SODIUM CHLORIDE 0.9 % WEIGHT BASED INFUSION
1.0000 mL/kg/h | INTRAVENOUS | Status: DC
  Administered 2015-12-23: 1 mL/kg/h via INTRAVENOUS

## 2015-12-22 MED ORDER — ASPIRIN 81 MG PO CHEW
324.0000 mg | CHEWABLE_TABLET | ORAL | Status: AC
  Administered 2015-12-23: 324 mg via ORAL
  Filled 2015-12-22: qty 4

## 2015-12-22 MED ORDER — HEPARIN BOLUS VIA INFUSION
4000.0000 [IU] | Freq: Once | INTRAVENOUS | Status: AC
  Administered 2015-12-22: 4000 [IU] via INTRAVENOUS

## 2015-12-22 MED ORDER — ACETAMINOPHEN 325 MG PO TABS: 650.0000 mg | ORAL_TABLET | ORAL | Status: DC | PRN

## 2015-12-22 MED ORDER — NITROGLYCERIN 0.4 MG SL SUBL: 0.4000 mg | SUBLINGUAL_TABLET | SUBLINGUAL | Status: DC | PRN

## 2015-12-22 MED ORDER — DOCUSATE SODIUM 100 MG PO CAPS: 100.0000 mg | ORAL_CAPSULE | Freq: Every day | ORAL | Status: DC | PRN

## 2015-12-22 NOTE — Progress Notes (Signed)
ANTICOAGULATION CONSULT NOTE - Initial Consult  Pharmacy Consult for heparin Indication: chest pain/ACS  No Known Allergies  Patient Measurements: Height: 5\' 7"  (170.2 cm) Weight: 191 lb 9.6 oz (86.9 kg) IBW/kg (Calculated) : 66.1 Heparin Dosing Weight: 84 kg  Vital Signs: Temp: 98.8 F (37.1 C) (09/21 1608) Temp Source: Oral (09/21 1608) BP: 172/99 (09/21 1608) Pulse Rate: 66 (09/21 1608)  Labs:  Recent Labs  12/22/15 1050 12/22/15 1804  HGB 13.0  --   HCT 37.6*  --   PLT 240  --   APTT 24  --   LABPROT 13.4 13.8  INR 1.02 1.06  HEPARINUNFRC  --  0.49  CREATININE 0.83  --   TROPONINI 0.40*  --     Estimated Creatinine Clearance: 105.8 mL/min (by C-G formula based on SCr of 0.83 mg/dL).   Assessment: Pt presents with chest pain on IV heparin, plan for cath tomorrow. Heparin level 0.49, therapeutic on 1100 units/hr   Goal of Therapy:  Heparin level 0.3-0.7 units/ml Monitor platelets by anticoagulation protocol: Yes    Plan:  -Continue heparin at current rate.  -F/u AM labs  Bayard Hugger, PharmD, BCPS  Clinical Pharmacist  Pager: [phone]   12/22/2015,6:49 PM

## 2015-12-22 NOTE — Progress Notes (Signed)
Critical troponin of 0.24 called from lab.  I took report and relayed results to Jilda Panda, RN, Mr. [facility] nurse.

## 2015-12-22 NOTE — ED Notes (Signed)
Dr Madilyn Hook states okay to given pt a light meal. Pt given TV dinner of meatloaf, potatoes and corn. Pt ate all of meal.

## 2015-12-22 NOTE — ED Notes (Signed)
Dr Madilyn Hook aware of BP and does not want to given any new orders at this time.

## 2015-12-22 NOTE — ED Notes (Signed)
[facility] here to transfer pt to [facility]. 

## 2015-12-22 NOTE — ED Notes (Signed)
Report given to [facility] on 3 [place] at [facility].

## 2015-12-22 NOTE — Progress Notes (Signed)
CRITICAL VALUE ALERT  Critical value received:   Troponin 0.24  Date of notification:  12/21/15  Time of notification:  1845  Critical value read back: yes  Nurse who received alert:  Margaretmary Bayley RN  MD notified (1st page):  Suzzette Righter NP  Time of first page:  [phone]

## 2015-12-22 NOTE — H&P (Signed)
History and Physical   Patient ID: Antonio [name] MRN: [id number], DOB/AGE: 56/10/1960 56 y.o. Date of Encounter: 12/22/2015  [facility]: [facility] Primary Cardiologist: MD was in [facility] 2010, not seen since  Chief Complaint:  NSTEMI  HPI: Antonio [name] is a 55 y.o. male with a history of DM, HTN, HLD, FH CAD, remote tobacco use.   Pt had been doing ok, but about a month ago, began having chest pain. At rest and with exertion. It was aching pain 8/10 at its worst. The symptoms gradually increased in frequency and intensity. The pain would be relieved by SL NTG x 1. As the symptoms progressed, he would require nitrates almost every day, sometimes more than once. He would get diaphoretic, no SOB, no N&V.   At a regularly scheduled appointment, he mentioned the chest pain. He was pain-free then, and they asked him to drive to [facility] for admission. He and his wife felt more urgent care was needed and hi BP was very high, so they went to [facility]. He developed more chest pain going to [facility].  At [facility], he got ASA 324 mg, IV Heparin and NTG paste. His troponin was elevated so he was transferred to [facility]. He is currently pain-free. No Kdur was given.  He has been having more GERD sx recently. No bleeding since 2014 (diverticular bleed, Effient d/c'd).    Past Medical History:  Diagnosis Date  . Diabetes (HCC)   . Diverticulosis   . GERD (gastroesophageal reflux disease)   . Heart attack [facility](HCC) 2011   2 st[place] [facility]  . Heart murmur   . Hemorrhoids   . Hyperlipidemia due to type 2 diabetes mellitus (HCC)   . Hypertension   . Stented coronary artery 2011    Surgical History:  Past Surgical History:  Procedure Laterality Date  . COLON RESECTION     12 inches [facility] out in [facility] for diverticulosis  . COLONOSCOPY N/A 10/18/2012   Procedure: COLONOSCOPY;  Surgeon: Louis Meckelobert D Kaplan, MD;  [facility];  Service: Endoscopy;  Laterality: N/A;  .  COLONOSCOPY N/A 10/22/2012   Procedure: COLONOSCOPY;  Surgeon: Iva Booparl E Gessner, MD;  [facility];  Service: Endoscopy;  Laterality: N/A;  . CORONARY ANGIOPLASTY WITH STENT PLACEMENT  2011   at [facility], 2 stents, locations unknown  . ESOPHAGOGASTRODUODENOSCOPY N/A 10/18/2012   Procedure: ESOPHAGOGASTRODUODENOSCOPY (EGD);  Surgeon: Louis Meckelobert D Kaplan, MD;  [facility];  Service: Endoscopy;  Laterality: N/A;  . HERNIA REPAIR    . IRRIGATION AND DEBRIDEMENT ABSCESS N/A 10/26/2012   Procedure: IRRIGATION AND DEBRIDEMENT PERINEAL ABSCESS;  Surgeon: Wilmon ArmsMatthew K. Corliss Skainssuei, MD;  Location: [facility] OR;  Service: General;  Laterality: N/A;     I have reviewed the patient's current medications. Prior to Admission medications   Medication Sig Start Date End Date Taking? Authorizing Provider  aspirin EC 81 MG tablet Take 162 mg by mouth 2 (two) times daily.    Yes Historical Provider, MD  docusate sodium 100 MG CAPS Take 100 mg by mouth 2 (two) times daily as needed for constipation. Patient taking differently: Take 100 mg by mouth daily as needed (for constipation).  10/30/12  Yes Lorretta HarpXilin Niu, MD  metFORMIN (GLUCOPHAGE-XR) 500 MG 24 hr tablet Take 1,000 mg by mouth 2 (two) times daily.   Yes Historical Provider, MD  nitroGLYCERIN (NITROSTAT) 0.4 MG SL tablet Place 0.4 mg under the tongue every 5 (five) minutes  as needed for chest pain.   Yes Historical Provider, MD  simvastatin (ZOCOR) 20 MG tablet Take 20 mg by mouth daily.   Yes Historical Provider, MD  atenolol-chlorthalidone (TENORETIC) 50-25 MG per tablet Take 1 tablet by mouth daily.    Historical Provider, MD  insulin glargine (LANTUS) 100 UNIT/ML injection Inject 51 Units into the skin at bedtime.    Historical Provider, MD  lisinopril (PRINIVIL,ZESTRIL) 40 MG tablet Take 40 mg by mouth daily.    Historical Provider, MD  pantoprazole (PROTONIX) 40 MG tablet Take 40 mg by mouth daily as needed. For heartburn    Historical Provider, MD     Scheduled Meds:  Continuous Infusions: . heparin 1,100 Units/hr (12/22/15 1107)   PRN Meds:.nitroGLYCERIN  Allergies: No Known Allergies  Social History   Social History  . Marital status: Married    Spouse name: N/A  . Number of children: N/A  . Years of education: N/A   Occupational History  . Retired Electronics engineer    Social History Main Topics  . Smoking status: Former Smoker    Types: Cigarettes    Quit date: 10/19/2002  . Smokeless tobacco: Never Used  . Alcohol use Yes     Comment: socially, one-2 drinks/month  . Drug use: No  . Sexual activity: Not on file   Other Topics Concern  . Not on file   Social History Narrative   Lives with wife in [place].    Family History  Problem Relation Age of Onset  . Alzheimer's disease Mother 26  . Heart attack Father 31  . CAD Brother 40    triple bypass   Family Status  Relation Status  . Mother Deceased  . Father Deceased  . Brother Alive    Review of Systems:   Full 14-point review of systems otherwise negative except as noted above.  Physical Exam: Blood pressure (!) 172/99, pulse 66, temperature 98.8 F (37.1 C), temperature source Oral, resp. rate 16, height 5\' 7"  (1.702 m), weight 191 lb 9.6 oz (86.9 kg), SpO2 100 %. General: Well developed, well nourished,male in no acute distress. Head: Normocephalic, atraumatic, sclera non-icteric, no xanthomas, nares are without discharge. Dentition: poor Neck: No carotid bruits. JVD not elevated. No thyromegally Lungs: Good expansion bilaterally. without wheezes or rhonchi.  Heart: Regular rate and rhythm with S1 S2.  No S3 or S4.  No murmur, no rubs, or gallops appreciated. Abdomen: Soft, non-tender, non-distended with normoactive bowel sounds. No hepatomegaly. No rebound/guarding. No obvious abdominal masses. Msk:  Strength and tone appear normal for age. No joint deformities or effusions, no spine or costo-vertebral angle tenderness. Extremities: No clubbing or  cyanosis. No edema.  Distal pedal pulses are 2+ in 4 extrem Neuro: Alert and oriented X 3. Moves all extremities spontaneously. No focal deficits noted. Psych:  Responds to questions appropriately with a normal affect. Skin: No rashes or lesions noted  Labs:   Lab Results  Component Value Date   WBC 6.5 12/22/2015   HGB 13.0 12/22/2015   HCT 37.6 (L) 12/22/2015   MCV 80.2 12/22/2015   PLT 240 12/22/2015    Recent Labs  12/22/15 1050  INR 1.02     Recent Labs Lab 12/22/15 1050  NA 136  K 3.4*  CL 101  CO2 25  BUN 9  CREATININE 0.83  CALCIUM 9.2  PROT 7.7  BILITOT 0.9  ALKPHOS 80  ALT 13*  AST 17  GLUCOSE 303*    Recent Labs  12/22/15 1050  TROPONINI 0.40*   Lab Results  Component Value Date   CHOL 197 12/22/2015   HDL 39 (L) 12/22/2015   LDLCALC 120 (H) 12/22/2015   TRIG 192 (H) 12/22/2015    Radiology/Studies: Dg Chest Port 1 View Result Date: 12/22/2015 CLINICAL DATA:  Chest pain for several weeks, worsening. EXAM: PORTABLE CHEST 1 VIEW COMPARISON:  PA and lateral chest 10/23/2012. FINDINGS: The lungs are clear. Heart size is normal. No pneumothorax or pleural effusion. No bony abnormality. IMPRESSION: Negative chest. Electronically Signed   By: Drusilla Kanner M.D.   On: 12/22/2015 11:07   ECG: 09/21 SR, diffuse inferolateral ST/T wave changes, slightly different from 2014  ASSESSMENT AND PLAN:  Principal Problem:   NSTEMI (non-ST elevated myocardial infarction) (HCC) - continue heparin, nitrates, ASA, BB, statin - cath scheduled tomorrow for 3 pm, Dr [place] - had diverticular bleed on Effient 2014, watch while anticoagulated  Active Problems:   Diabetes mellitus, type 2 (HCC) - home rx w/ [facility] - A1c has been elevated, check it    Hypertension - BP has been elevated - follow on current dose of lisinopril  - change atenolol/chlorthalidone to metoprolol - hold diuretic for cath    Hyperlipidemia due to type 2 diabetes mellitus (HCC) -  high-dose statin    Hypokalemia - supplement and recheck in am.   Melida Quitter, PA-C 12/22/2015 5:07 PM Beeper [phone]   Personally seen and examined. Agree with above.  55 year old with CAD (post 2 stents [place]) with uncontrolled DM, HTN, here with NSTEMI (Trop 0.4).   - Cath tomorrow. Risks and benefits explained (death, CVA, bleeding)  - Control BP   - DM mgt.   - Statin   RRR, Lungs Clear. Eager to eat.   Will follow closely.  Donato Schultz, MD

## 2015-12-22 NOTE — ED Notes (Addendum)
[facility] is transporting patient to 3E17 at [facility].   In route for transport.

## 2015-12-22 NOTE — Progress Notes (Signed)
New pt admission from [facility]. Pt brought to the floor via [facility] in stable condition. Vitals taken. Initial Assessment done. All immediate pertinent needs to patient addressed. Patient Guide given to patient. Important safety instructions relating to hospitalization reviewed with patient. Patient verbalized understanding. Paged admitting physician regarding patient arrival to unit. Will continue to monitor pt.  Jilda Panda RN

## 2015-12-22 NOTE — ED Notes (Signed)
Care turned over to transport team to transfer pt to [facility]. No pain at present.

## 2015-12-22 NOTE — ED Notes (Signed)
Report given to Lillia Pauls EMT-P

## 2015-12-22 NOTE — ED Triage Notes (Signed)
C/o intermittant mid sternum c/p  No n/v or sob. Onset 1 month ago. Has hx of  hrt problems and feels same.

## 2015-12-22 NOTE — ED Provider Notes (Signed)
[facility] Provider Note   CSN: [id number] Arrival date & time: 12/22/15  1022     History   Chief Complaint Chief Complaint  Patient presents with  . Chest Pain    HPI Antonio [name] is a 55 y.o. male.  The history is provided by the patient. No language interpreter was used.  Chest Pain     Antonio [name] is a 55 y.o. male who presents to the Emergency Department complaining of chest pain.  He has a history of coronary artery disease status post stenting. Over the last month he's experienced increased intermittent central chest pain described as a pressure and pain sensation. He has been taking increased nitroglycerin up to 3 times daily for his chest pain. He saw his PCP today for a routine appointment and [facility] he [facility] too [facility] to [facility] and he presented to this emergency Department. His last episode of chest pain was at 4 in the morning and woke him from sleep. He took 1 nitroglycerin and his pain resolved within 5 minutes. He is also experiencing intermittent diaphoresis and chest pain on exertion.  Past Medical History:  Diagnosis Date  . Diabetes (HCC)   . Diverticulosis   . GERD (gastroesophageal reflux disease)   . Heart attack [facility]) 2011   2 stents in [facility]  . Heart murmur   . Hemorrhoids   . Hyperlipidemia due to type 2 diabetes mellitus (HCC)   . Hypertension   . Stented coronary artery 2011    Patient Active Problem List   Diagnosis Date Noted  . NSTEMI (non-ST elevated myocardial infarction) (HCC) 12/22/2015  . Hypertension   . Hyperlipidemia due to type 2 diabetes mellitus (HCC)   . Perineal abscess 10/27/2012  . Diabetes mellitus, type 2 (HCC) 10/19/2012  . Acute lower GI bleeding 10/16/2012  . Diverticulosis 10/16/2012  . Acute blood loss anemia 10/16/2012  . CAD (coronary artery disease) 10/16/2012    Past Surgical History:  Procedure Laterality Date  . COLON RESECTION     12  inches taken out in 2011 at [facility] for diverticulosis  . COLONOSCOPY N/A 10/18/2012   Procedure: COLONOSCOPY;  Surgeon: Louis Meckel, MD;  Location: [facility] ENDOSCOPY;  Service: Endoscopy;  Laterality: N/A;  . COLONOSCOPY N/A 10/22/2012   Procedure: COLONOSCOPY;  Surgeon: Iva Boop, MD;  Location: [facility] ENDOSCOPY;  Service: Endoscopy;  Laterality: N/A;  . CORONARY ANGIOPLASTY WITH STENT PLACEMENT  2011   at [facility], 2 stents, locations unknown  . ESOPHAGOGASTRODUODENOSCOPY N/A 10/18/2012   Procedure: ESOPHAGOGASTRODUODENOSCOPY (EGD);  Surgeon: Louis Meckel, MD;  Location: [facility] ENDOSCOPY;  Service: Endoscopy;  Laterality: N/A;  . HERNIA REPAIR    . IRRIGATION AND DEBRIDEMENT ABSCESS N/A 10/26/2012   Procedure: IRRIGATION AND DEBRIDEMENT PERINEAL ABSCESS;  Surgeon: Wilmon Arms. Corliss Skains, MD;  Location: [facility] OR;  Service: General;  Laterality: N/A;       Home Medications    Prior to Admission medications   Medication Sig Start Date End Date Taking? Authorizing Provider  aspirin EC 81 MG tablet Take 162 mg by mouth 2 (two) times daily.    Yes Historical Provider, MD  atenolol-chlorthalidone (TENORETIC) 50-25 MG per tablet Take 1 tablet by mouth daily.   Yes Historical Provider, MD  docusate sodium 100 MG CAPS Take 100 mg by mouth 2 (two) times daily as needed for constipation. Patient taking differently: Take 100 mg by mouth daily as  needed (for constipation).  10/30/12  Yes Lorretta HarpXilin Niu, MD  insulin aspart protamine - aspart (NOVOLOG MIX 70/30 FLEXPEN) (70-30) 100 UNIT/ML FlexPen Inject 30 Units into the skin 2 (two) times daily before a meal.   Yes Historical Provider, MD  lisinopril (PRINIVIL,ZESTRIL) 40 MG tablet Take 40 mg by mouth daily.   Yes Historical Provider, MD  metFORMIN (GLUCOPHAGE-XR) 500 MG 24 hr tablet Take 1,000 mg by mouth 2 (two) times daily.   Yes Historical Provider, MD  nitroGLYCERIN (NITROSTAT) 0.4 MG SL tablet Place 0.4 mg under the tongue every 5 (five)  minutes as needed for chest pain.   Yes Historical Provider, MD  pantoprazole (PROTONIX) 40 MG tablet Take 40 mg by mouth daily as needed. For heartburn   Yes Historical Provider, MD  simvastatin (ZOCOR) 20 MG tablet Take 20 mg by mouth daily.   Yes Historical Provider, MD    Family History Family History  Problem Relation Age of Onset  . Alzheimer's disease Mother 4076  . Heart attack Father 6135  . CAD Brother 40    triple bypass    Social History Social History  Substance Use Topics  . Smoking status: Former Smoker    Types: Cigarettes    Quit date: 10/19/2002  . Smokeless tobacco: Never Used  . Alcohol use Yes     Comment: socially, one-2 drinks/month     Allergies   Review of patient's allergies indicates no known allergies.   Review of Systems Review of Systems  Cardiovascular: Positive for chest pain.     Physical Exam Updated Vital Signs BP (!) 134/93 (BP Location: Left Arm)   Pulse 62   Temp 98.5 F (36.9 C) (Oral)   Resp 18   Ht 5\' 7"  (1.702 m)   Wt 191 lb 11.2 oz (87 kg) Comment: scale c  SpO2 100%   BMI 30.02 kg/m   Physical Exam  Constitutional: He is oriented to person, place, and time. He appears well-developed and well-nourished.  HENT:  Head: Normocephalic and atraumatic.  Cardiovascular: Normal rate and regular rhythm.   No murmur heard. Pulmonary/Chest: Effort normal and breath sounds normal. No respiratory distress.  Abdominal: Soft. There is no tenderness. There is no rebound and no guarding.  Musculoskeletal: He exhibits no edema or tenderness.  Neurological: He is alert and oriented to person, place, and time.  Skin: Skin is warm and dry.  Psychiatric: He has a normal mood and affect. His behavior is normal.  Nursing note and vitals reviewed.    ED Treatments / Results  Labs (all labs ordered are listed, but only abnormal results are displayed) Labs Reviewed  CBC - Abnormal; Notable for the following:       Result Value   HCT  37.6 (*)    All other components within normal limits  COMPREHENSIVE METABOLIC PANEL - Abnormal; Notable for the following:    Potassium 3.4 (*)    Glucose, Bld 303 (*)    ALT 13 (*)    All other components within normal limits  TROPONIN I - Abnormal; Notable for the following:    Troponin I 0.40 (*)    All other components within normal limits  LIPID PANEL - Abnormal; Notable for the following:    Triglycerides 192 (*)    HDL 39 (*)    LDL Cholesterol 120 (*)    All other components within normal limits  GLUCOSE, CAPILLARY - Abnormal; Notable for the following:    Glucose-Capillary 306 (*)  All other components within normal limits  COMPREHENSIVE METABOLIC PANEL - Abnormal; Notable for the following:    Potassium 3.2 (*)    Glucose, Bld 167 (*)    Albumin 3.3 (*)    ALT 14 (*)    All other components within normal limits  TROPONIN I - Abnormal; Notable for the following:    Troponin I 0.24 (*)    All other components within normal limits  TROPONIN I - Abnormal; Notable for the following:    Troponin I 0.24 (*)    All other components within normal limits  TROPONIN I - Abnormal; Notable for the following:    Troponin I 0.27 (*)    All other components within normal limits  HEMOGLOBIN A1C - Abnormal; Notable for the following:    Hgb A1c MFr Bld 12.4 (*)    All other components within normal limits  LIPID PANEL - Abnormal; Notable for the following:    HDL 39 (*)    LDL Cholesterol 121 (*)    All other components within normal limits  GLUCOSE, CAPILLARY - Abnormal; Notable for the following:    Glucose-Capillary 325 (*)    All other components within normal limits  DIFFERENTIAL  PROTIME-INR  APTT  HEPARIN LEVEL (UNFRACTIONATED)  CBC  HEPARIN LEVEL (UNFRACTIONATED)  PROTIME-INR    EKG  EKG Interpretation  Date/Time:  Thursday December 22 2015 10:32:06 EDT Ventricular Rate:  66 PR Interval:    QRS Duration: 95 QT Interval:  402 QTC Calculation: 422 R  Axis:   106 Text Interpretation:  Sinus rhythm Right axis deviation Borderline repolarization abnormality Confirmed by Lincoln Brigham [phone]) on 12/22/2015 10:35:31 AM       Radiology Dg Chest Port 1 View  Result Date: 12/22/2015 CLINICAL DATA:  Chest pain for several weeks, worsening. EXAM: PORTABLE CHEST 1 VIEW COMPARISON:  PA and lateral chest 10/23/2012. FINDINGS: The lungs are clear. Heart size is normal. No pneumothorax or pleural effusion. No bony abnormality. IMPRESSION: Negative chest. Electronically Signed   By: Drusilla Kanner M.D.   On: 12/22/2015 11:07    Procedures Procedures (including critical care time)  Medications Ordered in ED Medications  heparin ADULT infusion 100 units/mL (25000 units/269mL sodium chloride 0.45%) (1,100 Units/hr Intravenous New Bag/Given 12/23/15 0200)  nitroGLYCERIN (NITROSTAT) SL tablet 0.4 mg (0.4 mg Sublingual Given 12/22/15 1337)  acetaminophen (TYLENOL) tablet 650 mg (not administered)  ondansetron (ZOFRAN) injection 4 mg (not administered)  ALPRAZolam (XANAX) tablet 0.25 mg (not administered)  zolpidem (AMBIEN) tablet 5 mg (5 mg Oral Given 12/22/15 2119)  sodium chloride flush (NS) 0.9 % injection 3 mL (3 mLs Intravenous Given 12/22/15 2348)  sodium chloride flush (NS) 0.9 % injection 3 mL (not administered)  0.9 %  sodium chloride infusion (not administered)  sodium chloride flush (NS) 0.9 % injection 3 mL (3 mLs Intravenous Given 12/22/15 2348)  sodium chloride flush (NS) 0.9 % injection 3 mL (not administered)  0.9 %  sodium chloride infusion (not administered)  0.9% sodium chloride infusion (3 mL/kg/hr  86.9 kg Intravenous New Bag/Given 12/23/15 0516)    Followed by  0.9% sodium chloride infusion (1 mL/kg/hr  86.9 kg Intravenous New Bag/Given 12/23/15 0612)  aspirin EC tablet 162 mg (162 mg Oral Given 12/22/15 2118)  metoprolol tartrate (LOPRESSOR) tablet 25 mg (25 mg Oral Given 12/22/15 2119)  docusate sodium (COLACE) capsule 100 mg (not  administered)  insulin aspart protamine- aspart (NOVOLOG MIX 70/30) injection 30 Units (30 Units Subcutaneous Given 12/23/15 0610)  lisinopril (PRINIVIL,ZESTRIL) tablet 40 mg (40 mg Oral Given 12/22/15 1801)  pantoprazole (PROTONIX) EC tablet 40 mg (40 mg Oral Given 12/22/15 2119)  atorvastatin (LIPITOR) tablet 80 mg (80 mg Oral Given 12/22/15 1801)  insulin aspart (novoLOG) injection 0-15 Units (3 Units Subcutaneous Given 12/23/15 0611)  insulin aspart (novoLOG) injection 0-5 Units (4 Units Subcutaneous Given 12/22/15 2119)  aspirin chewable tablet 324 mg (324 mg Oral Given 12/22/15 1103)  heparin bolus via infusion 4,000 Units (4,000 Units Intravenous Bolus from Bag 12/22/15 1111)  nitroGLYCERIN (NITROGLYN) 2 % ointment 1 inch (1 inch Topical Given 12/22/15 1410)  aspirin chewable tablet 324 mg (324 mg Oral Given 12/23/15 0610)     Initial Impression / Assessment and Plan / ED Course  I have reviewed the triage vital signs and the nursing notes.  Pertinent labs & imaging results that were available during my care of the patient were reviewed by me and considered in my medical decision making (see chart for details).  Clinical Course  Patient with history of coronary artery disease here with stuttering chest pain over the last month that is nitroglycerin responsive. EKG does have some new ischemic changes compared to his priors. Based on history and presentation he was treated with aspirin and heparin drip for presumed acute coronary syndrome. Troponin is elevated, consistent with nonSTEMI. Discussed with Dr. Anne Fu with Cardiology who agrees to accept the patient in transfer. Patient did have one recurrent episode of chest pain in the emergency department that resolved after one nitroglycerin. He was provided with nitroglycerin paste with no recurrent chest pain and he was transferred to [facility] for further treatment.  Final Clinical Impressions(s) / ED Diagnoses   Final diagnoses:  Non-STEMI  (non-ST elevated myocardial infarction) [facility])    New Prescriptions Current Discharge Medication List       Tilden Fossa, MD 12/23/15 0730

## 2015-12-22 NOTE — Progress Notes (Signed)
ANTICOAGULATION CONSULT NOTE - Initial Consult  Pharmacy Consult for heparin Indication: chest pain/ACS  No Known Allergies  Patient Measurements: Height: 5\' 7"  (170.2 cm) Weight: 194 lb (88 kg) IBW/kg (Calculated) : 66.1 Heparin Dosing Weight: 84 kg  Vital Signs: Temp: 98.2 F (36.8 C) (09/21 1025) Temp Source: Oral (09/21 1025) BP: 179/92 (09/21 1025) Pulse Rate: 63 (09/21 1025)  Labs: No results for input(s): HGB, HCT, PLT, APTT, LABPROT, INR, HEPARINUNFRC, HEPRLOWMOCWT, CREATININE, CKTOTAL, CKMB, TROPONINI in the last 72 hours.  CrCl cannot be calculated (Patient's most recent lab result is older than the maximum 21 days allowed.).   Medical History: Past Medical History:  Diagnosis Date  . Coronary artery disease    3 stents  . Diabetes (HCC)   . Diverticulosis   . GERD (gastroesophageal reflux disease)   . Heart murmur   . Hemorrhoids   . Hypertension   . Myocardial infarction (HCC)   . Stented coronary artery     Medications:  See EMR  Assessment: Pt presents with chest pain, he states the pain resolved after 1 nitroglycerin that he took prior to admission. Received aspirin 324 mg x1 in ED. SCr 0.8, hgb 13, plts wnl, INR 1. Pt is not on anticoagulation prior to admission.    Goal of Therapy:  Heparin level 0.3-0.7 units/ml Monitor platelets by anticoagulation protocol: Yes    Plan:  -Heparin bolus 4000 units x1 then 1100 units/hr -Daily HL, CBC -First level this afternoon    Antonio [name], Darl Householder 12/22/2015,10:55 AM

## 2015-12-23 ENCOUNTER — Encounter (HOSPITAL_COMMUNITY)
Admission: EM | Disposition: A | Discharge status: Home / Self Care | Payer: Self-pay | Source: Home / Self Care | Attending: Cardiology

## 2015-12-23 DIAGNOSIS — I2510 Atherosclerotic heart disease of native coronary artery without angina pectoris: Secondary | ICD-10-CM

## 2015-12-23 HISTORY — PX: CARDIAC CATHETERIZATION: SHX172

## 2015-12-23 LAB — GLUCOSE, CAPILLARY
GLUCOSE-CAPILLARY: 192 mg/dL — AB (ref 65–99)
GLUCOSE-CAPILLARY: 239 mg/dL — AB (ref 65–99)
Glucose-Capillary: 157 mg/dL — ABNORMAL HIGH (ref 65–99)
Glucose-Capillary: 113 mg/dL — ABNORMAL HIGH (ref 65–99)
Glucose-Capillary: 264 mg/dL — ABNORMAL HIGH (ref 65–99)

## 2015-12-23 LAB — CBC
HCT: 39.9 % (ref 39.0–52.0)
HEMOGLOBIN: 13.1 g/dL (ref 13.0–17.0)
MCH: 26.9 pg (ref 26.0–34.0)
MCHC: 32.8 g/dL (ref 30.0–36.0)
MCV: 81.9 fL (ref 78.0–100.0)
PLATELETS: 234 10*3/uL (ref 150–400)
RBC: 4.87 MIL/uL (ref 4.22–5.81)
RDW: 15.1 % (ref 11.5–15.5)
WBC: 7.1 10*3/uL (ref 4.0–10.5)

## 2015-12-23 LAB — COMPREHENSIVE METABOLIC PANEL
ALBUMIN: 3.3 g/dL — AB (ref 3.5–5.0)
ALK PHOS: 75 U/L (ref 38–126)
ALT: 14 U/L — ABNORMAL LOW (ref 17–63)
ANION GAP: 8 (ref 5–15)
AST: 17 U/L (ref 15–41)
BILIRUBIN TOTAL: 0.9 mg/dL (ref 0.3–1.2)
BUN: 8 mg/dL (ref 6–20)
CALCIUM: 8.9 mg/dL (ref 8.9–10.3)
CO2: 26 mmol/L (ref 22–32)
Chloride: 102 mmol/L (ref 101–111)
Creatinine, Ser: 0.85 mg/dL (ref 0.61–1.24)
GFR calc Af Amer: >60 mL/min (ref >60)
GFR calc non Af Amer: >60 mL/min (ref >60)
GLUCOSE: 167 mg/dL — AB (ref 65–99)
POTASSIUM: 3.2 mmol/L — AB (ref 3.5–5.1)
SODIUM: 136 mmol/L (ref 135–145)
TOTAL PROTEIN: 6.6 g/dL (ref 6.5–8.1)

## 2015-12-23 LAB — LIPID PANEL
CHOL/HDL RATIO: 4.8 ratio
Cholesterol: 188 mg/dL (ref 0–200)
HDL: 39 mg/dL — AB (ref >40)
LDL Cholesterol: 121 mg/dL — ABNORMAL HIGH (ref 0–99)
TRIGLYCERIDES: 138 mg/dL (ref <150)
VLDL: 28 mg/dL (ref 0–40)

## 2015-12-23 LAB — TROPONIN I
Troponin I: 0.24 ng/mL (ref <0.03)
Troponin I: 0.27 ng/mL (ref <0.03)

## 2015-12-23 LAB — HEPARIN LEVEL (UNFRACTIONATED): Heparin Unfractionated: 0.58 [IU]/mL (ref 0.30–0.70)

## 2015-12-23 LAB — POCT ACTIVATED CLOTTING TIME
Activated Clotting Time: 241 seconds
Activated Clotting Time: 290 seconds

## 2015-12-23 LAB — HEMOGLOBIN A1C
HEMOGLOBIN A1C: 12.4 % — AB (ref 4.8–5.6)
MEAN PLASMA GLUCOSE: 309 mg/dL

## 2015-12-23 SURGERY — LEFT HEART CATH AND CORONARY ANGIOGRAPHY

## 2015-12-23 MED ORDER — SODIUM CHLORIDE 0.9% FLUSH: 3.0000 mL | INTRAVENOUS | Status: DC | PRN

## 2015-12-23 MED ORDER — ALPRAZOLAM 0.5 MG PO TABS: 0.5000 mg | ORAL_TABLET | ORAL | Status: DC | PRN

## 2015-12-23 MED ORDER — ASPIRIN 81 MG PO CHEW
81.0000 mg | CHEWABLE_TABLET | Freq: Every day | ORAL | Status: DC
  Administered 2015-12-24: 10:00:00 81 mg via ORAL
  Filled 2015-12-23: qty 1

## 2015-12-23 MED ORDER — SODIUM CHLORIDE 0.9 % WEIGHT BASED INFUSION
3.0000 mL/kg/h | INTRAVENOUS | Status: DC
  Administered 2015-12-23: 18:00:00 3 mL/kg/h via INTRAVENOUS

## 2015-12-23 MED ORDER — TICAGRELOR 90 MG PO TABS
ORAL_TABLET | ORAL | Status: AC
  Filled 2015-12-23: qty 2

## 2015-12-23 MED ORDER — HEPARIN SODIUM (PORCINE) 1000 UNIT/ML IJ SOLN
INTRAMUSCULAR | Status: AC
  Filled 2015-12-23: qty 1

## 2015-12-23 MED ORDER — HYDRALAZINE HCL 20 MG/ML IJ SOLN
10.0000 mg | INTRAMUSCULAR | Status: DC | PRN
  Administered 2015-12-23 – 2015-12-24 (×3): 10 mg via INTRAVENOUS
  Filled 2015-12-23 (×4): qty 1

## 2015-12-23 MED ORDER — ALPRAZOLAM 0.5 MG PO TABS
0.5000 mg | ORAL_TABLET | ORAL | Status: DC | PRN
  Filled 2015-12-23: qty 1

## 2015-12-23 MED ORDER — POTASSIUM CHLORIDE CRYS ER 20 MEQ PO TBCR
20.0000 meq | EXTENDED_RELEASE_TABLET | Freq: Every day | ORAL | Status: DC
  Administered 2015-12-23 – 2015-12-24 (×2): 20 meq via ORAL
  Filled 2015-12-23 (×2): qty 1

## 2015-12-23 MED ORDER — FENTANYL CITRATE (PF) 100 MCG/2ML IJ SOLN
INTRAMUSCULAR | Status: DC | PRN
  Administered 2015-12-23: 25 ug via INTRAVENOUS

## 2015-12-23 MED ORDER — VERAPAMIL HCL 2.5 MG/ML IV SOLN
INTRAVENOUS | Status: DC | PRN
  Administered 2015-12-23: 10 mL via INTRA_ARTERIAL

## 2015-12-23 MED ORDER — POTASSIUM CHLORIDE CRYS ER 20 MEQ PO TBCR
40.0000 meq | EXTENDED_RELEASE_TABLET | Freq: Once | ORAL | Status: AC
  Administered 2015-12-23: 40 meq via ORAL
  Filled 2015-12-23: qty 2

## 2015-12-23 MED ORDER — ANGIOPLASTY BOOK
Freq: Once | Status: AC
  Administered 2015-12-23: 22:00:00
  Filled 2015-12-23: qty 1

## 2015-12-23 MED ORDER — NITROGLYCERIN 1 MG/10 ML FOR IR/CATH LAB
INTRA_ARTERIAL | Status: DC | PRN
  Administered 2015-12-23 (×2): 200 ug via INTRACORONARY

## 2015-12-23 MED ORDER — LIDOCAINE HCL (PF) 1 % IJ SOLN
INTRAMUSCULAR | Status: DC | PRN
  Administered 2015-12-23: 2 mL

## 2015-12-23 MED ORDER — TICAGRELOR 90 MG PO TABS
ORAL_TABLET | ORAL | Status: DC | PRN
  Administered 2015-12-23: 180 mg via ORAL

## 2015-12-23 MED ORDER — MIDAZOLAM HCL 2 MG/2ML IJ SOLN
INTRAMUSCULAR | Status: DC | PRN
  Administered 2015-12-23: 2 mg via INTRAVENOUS

## 2015-12-23 MED ORDER — CARVEDILOL 3.125 MG PO TABS
6.2500 mg | ORAL_TABLET | Freq: Two times a day (BID) | ORAL | Status: DC
  Administered 2015-12-23 – 2015-12-24 (×2): 6.25 mg via ORAL
  Filled 2015-12-23: qty 1
  Filled 2015-12-23: qty 2

## 2015-12-23 MED ORDER — HEPARIN SODIUM (PORCINE) 1000 UNIT/ML IJ SOLN
INTRAMUSCULAR | Status: DC | PRN
  Administered 2015-12-23: 3500 [IU] via INTRAVENOUS
  Administered 2015-12-23: 4500 [IU] via INTRAVENOUS
  Administered 2015-12-23: 2000 [IU] via INTRAVENOUS

## 2015-12-23 MED ORDER — IOPAMIDOL (ISOVUE-370) INJECTION 76%
INTRAVENOUS | Status: AC
Start: 2015-12-23 — End: 2015-12-23
  Filled 2015-12-23: qty 100

## 2015-12-23 MED ORDER — NITROGLYCERIN 1 MG/10 ML FOR IR/CATH LAB
INTRA_ARTERIAL | Status: AC
  Filled 2015-12-23: qty 10

## 2015-12-23 MED ORDER — LIDOCAINE HCL (PF) 1 % IJ SOLN
INTRAMUSCULAR | Status: AC
  Filled 2015-12-23: qty 30

## 2015-12-23 MED ORDER — TICAGRELOR 90 MG PO TABS
90.0000 mg | ORAL_TABLET | Freq: Two times a day (BID) | ORAL | Status: DC
  Administered 2015-12-24: 90 mg via ORAL
  Filled 2015-12-23: qty 1

## 2015-12-23 MED ORDER — MIDAZOLAM HCL 2 MG/2ML IJ SOLN
INTRAMUSCULAR | Status: AC
  Filled 2015-12-23: qty 2

## 2015-12-23 MED ORDER — SODIUM CHLORIDE 0.9 % IV SOLN: 250.0000 mL | INTRAVENOUS | Status: DC | PRN

## 2015-12-23 MED ORDER — AMLODIPINE BESYLATE 5 MG PO TABS
5.0000 mg | ORAL_TABLET | Freq: Every day | ORAL | Status: DC
  Administered 2015-12-23 – 2015-12-24 (×2): 5 mg via ORAL
  Filled 2015-12-23 (×2): qty 1

## 2015-12-23 MED ORDER — HEPARIN (PORCINE) IN NACL 2-0.9 UNIT/ML-% IJ SOLN
INTRAMUSCULAR | Status: DC | PRN
  Administered 2015-12-23: 2000 mL

## 2015-12-23 MED ORDER — VERAPAMIL HCL 2.5 MG/ML IV SOLN
INTRAVENOUS | Status: AC
Start: 2015-12-23 — End: 2015-12-23
  Filled 2015-12-23: qty 2

## 2015-12-23 MED ORDER — IOPAMIDOL (ISOVUE-370) INJECTION 76%
INTRAVENOUS | Status: DC | PRN
  Administered 2015-12-23: 150 mL via INTRA_ARTERIAL

## 2015-12-23 MED ORDER — SODIUM CHLORIDE 0.9% FLUSH
3.0000 mL | Freq: Two times a day (BID) | INTRAVENOUS | Status: DC
  Administered 2015-12-23: 22:00:00 3 mL via INTRAVENOUS

## 2015-12-23 MED ORDER — ALBUTEROL SULFATE (2.5 MG/3ML) 0.083% IN NEBU
2.5000 mg | INHALATION_SOLUTION | Freq: Four times a day (QID) | RESPIRATORY_TRACT | Status: DC | PRN
  Administered 2015-12-23: 2.5 mg via RESPIRATORY_TRACT
  Filled 2015-12-23: qty 3

## 2015-12-23 MED ORDER — HEART ATTACK BOUNCING BOOK
Freq: Once | Status: AC
  Administered 2015-12-23: 22:00:00
  Filled 2015-12-23: qty 1

## 2015-12-23 MED ORDER — HEPARIN (PORCINE) IN NACL 2-0.9 UNIT/ML-% IJ SOLN
INTRAMUSCULAR | Status: AC
  Filled 2015-12-23: qty 1000

## 2015-12-23 MED ORDER — FENTANYL CITRATE (PF) 100 MCG/2ML IJ SOLN
INTRAMUSCULAR | Status: AC
  Filled 2015-12-23: qty 2

## 2015-12-23 SURGICAL SUPPLY — 20 items
BALLN EMERGE MR 2.5X12 (BALLOONS) ×3
BALLN ~~LOC~~ EMERGE MR 3.5X12 (BALLOONS) ×3
BALLOON EMERGE MR 2.5X12 (BALLOONS) ×1 IMPLANT
BALLOON ~~LOC~~ EMERGE MR 3.5X12 (BALLOONS) ×1 IMPLANT
CATH INFINITI 5 FR JL3.5 (CATHETERS) ×3 IMPLANT
CATH INFINITI 5 FR STR PIGTAIL (CATHETERS) ×3 IMPLANT
CATH INFINITI JR4 5F (CATHETERS) ×3 IMPLANT
DEVICE RAD COMP TR BAND LRG (VASCULAR PRODUCTS) ×3 IMPLANT
GLIDESHEATH SLEND SS 6F .021 (SHEATH) ×3 IMPLANT
GUIDE CATH RUNWAY 6FR FR4 (CATHETERS) ×3 IMPLANT
KIT ENCORE 26 ADVANTAGE (KITS) ×3 IMPLANT
KIT HEART LEFT (KITS) ×3 IMPLANT
PACK CARDIAC CATHETERIZATION (CUSTOM PROCEDURE TRAY) ×3 IMPLANT
STENT PROMUS PREM MR 3.0X16 (Permanent Stent) ×3 IMPLANT
STENT PROMUS PREM MR 3.5X12 (Permanent Stent) ×3 IMPLANT
SYR MEDRAD MARK V 150ML (SYRINGE) ×3 IMPLANT
TRANSDUCER W/STOPCOCK (MISCELLANEOUS) ×3 IMPLANT
TUBING CIL FLEX 10 FLL-RA (TUBING) ×3 IMPLANT
WIRE ASAHI PROWATER 180CM (WIRE) ×3 IMPLANT
WIRE SAFE-T 1.5MM-J .035X260CM (WIRE) ×3 IMPLANT

## 2015-12-23 NOTE — Progress Notes (Signed)
ANTICOAGULATION CONSULT NOTE - Follow Up Consult  Pharmacy Consult for Heparin Indication: chest pain/ACS  No Known Allergies  Patient Measurements: Height: 5\' 7"  (170.2 cm) Weight: 191 lb 11.2 oz (87 kg) (scale c) IBW/kg (Calculated) : 66.1 Heparin Dosing Weight: 84 kg  Vital Signs: Temp: 97.6 F (36.4 C) (09/22 0800) Temp Source: Oral (09/22 0800) BP: 156/105 (09/22 0800) Pulse Rate: 87 (09/22 0800)  Labs:  Recent Labs  12/22/15 1050 12/22/15 1804 12/22/15 2309 12/23/15 0421  HGB 13.0  --   --  13.1  HCT 37.6*  --   --  39.9  PLT 240  --   --  234  APTT 24  --   --   --   LABPROT 13.4 13.8  --   --   INR 1.02 1.06  --   --   HEPARINUNFRC  --  0.49  --  0.58  CREATININE 0.83  --   --  0.85  TROPONINI 0.40* 0.24* 0.24* 0.27*    Estimated Creatinine Clearance: 103.5 mL/min (by C-G formula based on SCr of 0.85 mg/dL).  Assessment: 55 year old male on IV heparin for ACS (NSTEMI) for cath this PM.   Heparin level is theapeutic x2 days on 1100 units/hr.  No bleeding reported. CBC remains stable.   Goal of Therapy:  Heparin level 0.3-0.7 units/ml Monitor platelets by anticoagulation protocol: Yes   Plan:  Continue heparin at 1100 units/hr. Daily heparin level and CBC.  Follow-up post cath this PM.   Link Snuffer, PharmD, BCPS Clinical Pharmacist [phone] 12/23/2015,2:25 PM

## 2015-12-23 NOTE — H&P (View-Only) (Signed)
Patient Name: Antonio [name] Date of Encounter: 12/23/2015  Principal Problem:   NSTEMI (non-ST elevated myocardial infarction) [facility]) Active Problems:   Diabetes mellitus, type 2 (HCC)   Hypertension   Hyperlipidemia due to type 2 diabetes mellitus [facility])   Primary Cardiologist: Dr Anne Fu Patient Profile: 55 y.o. male with a history of DM, HTN, HLD, FH CAD, remote tobacco use was admitted 09/21 w/ NSTEMI  SUBJECTIVE: Slight HA from the NTG, no chest pain or SOB.  OBJECTIVE Vitals:   12/22/15 1608 12/22/15 2036 12/23/15 0101 12/23/15 0537  BP: (!) 172/99 (!) 171/96 (!) 150/91 (!) 134/93  Pulse: 66 90 65 62  Resp: 16 18 18 18   Temp: 98.8 F (37.1 C) 98.8 F (37.1 C) 98.2 F (36.8 C) 98.5 F (36.9 C)  TempSrc: Oral Oral Oral Oral  SpO2: 100% 100% 100% 100%  Weight: 191 lb 9.6 oz (86.9 kg)   191 lb 11.2 oz (87 kg)  Height: 5\' 7"  (1.702 m)       Intake/Output Summary (Last 24 hours) at 12/23/15 0753 Last data filed at 12/23/15 0612  Gross per 24 hour  Intake           209.92 ml  Output             1600 ml  Net         -1390.08 ml   Filed Weights   12/22/15 1025 12/22/15 1608 12/23/15 0537  Weight: 194 lb (88 kg) 191 lb 9.6 oz (86.9 kg) 191 lb 11.2 oz (87 kg)    PHYSICAL EXAM General: Well developed, well nourished, male in no acute distress. Head: Normocephalic, atraumatic.  Neck: Supple without bruits, JVD not elevated. Lungs:  Resp regular and unlabored, CTA. Heart: RRR, S1, S2, no S3, S4, or murmur; no rub. Abdomen: Soft, non-tender, non-distended, BS + x 4.  Extremities: No clubbing, cyanosis, edema.  Neuro: Alert and oriented X 3. Moves all extremities spontaneously. Psych: Normal affect.  LABS: CBC: Recent Labs  12/22/15 1050 12/23/15 0421  WBC 6.5 7.1  NEUTROABS 3.7  --   HGB 13.0 13.1  HCT 37.6* 39.9  MCV 80.2 81.9  PLT 240 234   INR: Recent Labs  12/22/15 1804  INR 1.06   Basic Metabolic Panel: Recent Labs  12/22/15 1050  12/23/15 0421  NA 136 136  K 3.4* 3.2*  CL 101 102  CO2 25 26  GLUCOSE 303* 167*  BUN 9 8  CREATININE 0.83 0.85  CALCIUM 9.2 8.9   Liver Function Tests: Recent Labs  12/22/15 1050 12/23/15 0421  AST 17 17  ALT 13* 14*  ALKPHOS 80 75  BILITOT 0.9 0.9  PROT 7.7 6.6  ALBUMIN 4.1 3.3*   Cardiac Enzymes: Recent Labs  12/22/15 1804 12/22/15 2309 12/23/15 0421  TROPONINI 0.24* 0.24* 0.27*   Hemoglobin A1C: Recent Labs  12/22/15 1804  HGBA1C 12.4*   Fasting Lipid Panel: Recent Labs  12/23/15 0421  CHOL 188  HDL 39*  LDLCALC 121*  TRIG 138  CHOLHDL 4.8    TELE:  SR , sinus brady high 40s while asleep    Radiology/Studies: Dg Chest Port 1 View Result Date: 12/22/2015 CLINICAL DATA:  Chest pain for several weeks, worsening. EXAM: PORTABLE CHEST 1 VIEW COMPARISON:  PA and lateral chest 10/23/2012. FINDINGS: The lungs are clear. Heart size is normal. No pneumothorax or pleural effusion. No bony abnormality. IMPRESSION: Negative chest. Electronically Signed   By: Drusilla Kanner M.D.  On: 12/22/2015 11:07    Current Medications:  . aspirin EC  162 mg Oral BID  . atorvastatin  80 mg Oral q1800  . insulin aspart  0-15 Units Subcutaneous TID WC  . insulin aspart  0-5 Units Subcutaneous QHS  . insulin aspart protamine- aspart  30 Units Subcutaneous BID AC  . lisinopril  40 mg Oral Daily  . metoprolol tartrate  25 mg Oral BID  . pantoprazole  40 mg Oral BID  . sodium chloride flush  3 mL Intravenous Q12H  . sodium chloride flush  3 mL Intravenous Q12H   . sodium chloride 1 mL/kg/hr (12/23/15 0612)  . heparin 1,100 Units/hr (12/23/15 0200)    ASSESSMENT AND PLAN: Principal Problem:   NSTEMI (non-ST elevated myocardial infarction) (HCC) - for cath this pm, continue current therapy - with bradycardia while sleeping, no BB increase  Active Problems:   Diabetes mellitus, type 2 (HCC) - A1c is high, encourage dietary compliance - if CBGs run high here,  increase home insulin dose    Hypertension - BP poorly controlled - chlorthalidone held for cath - add amlodipine 5 mg - change metop 25 bid to coreg 6.25 BID    Hyperlipidemia due to type 2 diabetes mellitus (HCC) - continue high-dose statin - LFTs are ok    Hypokalemia - supplement and follow  Signed, Barrett, Rhonda , PA-C 7:53 AM 12/23/2015  Personally seen and examined. Agree with above. Appropriate changes made to note above.  Amlodipine to help with BP Cath this PM(post 2 stents [place])   Mada Sadik, MD  

## 2015-12-23 NOTE — Progress Notes (Signed)
Patient Name: Antonio [name] Date of Encounter: 12/23/2015  Principal Problem:   NSTEMI (non-ST elevated myocardial infarction) [facility]) Active Problems:   Diabetes mellitus, type 2 (HCC)   Hypertension   Hyperlipidemia due to type 2 diabetes mellitus [facility])   Primary Cardiologist: Dr Anne Fu Patient Profile: 55 y.o. male with a history of DM, HTN, HLD, FH CAD, remote tobacco use was admitted 09/21 w/ NSTEMI  SUBJECTIVE: Slight HA from the NTG, no chest pain or SOB.  OBJECTIVE Vitals:   12/22/15 1608 12/22/15 2036 12/23/15 0101 12/23/15 0537  BP: (!) 172/99 (!) 171/96 (!) 150/91 (!) 134/93  Pulse: 66 90 65 62  Resp: 16 18 18 18   Temp: 98.8 F (37.1 C) 98.8 F (37.1 C) 98.2 F (36.8 C) 98.5 F (36.9 C)  TempSrc: Oral Oral Oral Oral  SpO2: 100% 100% 100% 100%  Weight: 191 lb 9.6 oz (86.9 kg)   191 lb 11.2 oz (87 kg)  Height: 5\' 7"  (1.702 m)       Intake/Output Summary (Last 24 hours) at 12/23/15 0753 Last data filed at 12/23/15 0612  Gross per 24 hour  Intake           209.92 ml  Output             1600 ml  Net         -1390.08 ml   Filed Weights   12/22/15 1025 12/22/15 1608 12/23/15 0537  Weight: 194 lb (88 kg) 191 lb 9.6 oz (86.9 kg) 191 lb 11.2 oz (87 kg)    PHYSICAL EXAM General: Well developed, well nourished, male in no acute distress. Head: Normocephalic, atraumatic.  Neck: Supple without bruits, JVD not elevated. Lungs:  Resp regular and unlabored, CTA. Heart: RRR, S1, S2, no S3, S4, or murmur; no rub. Abdomen: Soft, non-tender, non-distended, BS + x 4.  Extremities: No clubbing, cyanosis, edema.  Neuro: Alert and oriented X 3. Moves all extremities spontaneously. Psych: Normal affect.  LABS: CBC: Recent Labs  12/22/15 1050 12/23/15 0421  WBC 6.5 7.1  NEUTROABS 3.7  --   HGB 13.0 13.1  HCT 37.6* 39.9  MCV 80.2 81.9  PLT 240 234   INR: Recent Labs  12/22/15 1804  INR 1.06   Basic Metabolic Panel: Recent Labs  12/22/15 1050  12/23/15 0421  NA 136 136  K 3.4* 3.2*  CL 101 102  CO2 25 26  GLUCOSE 303* 167*  BUN 9 8  CREATININE 0.83 0.85  CALCIUM 9.2 8.9   Liver Function Tests: Recent Labs  12/22/15 1050 12/23/15 0421  AST 17 17  ALT 13* 14*  ALKPHOS 80 75  BILITOT 0.9 0.9  PROT 7.7 6.6  ALBUMIN 4.1 3.3*   Cardiac Enzymes: Recent Labs  12/22/15 1804 12/22/15 2309 12/23/15 0421  TROPONINI 0.24* 0.24* 0.27*   Hemoglobin A1C: Recent Labs  12/22/15 1804  HGBA1C 12.4*   Fasting Lipid Panel: Recent Labs  12/23/15 0421  CHOL 188  HDL 39*  LDLCALC 121*  TRIG 138  CHOLHDL 4.8    TELE:  SR , sinus brady high 40s while asleep    Radiology/Studies: Dg Chest Port 1 View Result Date: 12/22/2015 CLINICAL DATA:  Chest pain for several weeks, worsening. EXAM: PORTABLE CHEST 1 VIEW COMPARISON:  PA and lateral chest 10/23/2012. FINDINGS: The lungs are clear. Heart size is normal. No pneumothorax or pleural effusion. No bony abnormality. IMPRESSION: Negative chest. Electronically Signed   By: Drusilla Kanner M.D.  On: 12/22/2015 11:07    Current Medications:  . aspirin EC  162 mg Oral BID  . atorvastatin  80 mg Oral q1800  . insulin aspart  0-15 Units Subcutaneous TID WC  . insulin aspart  0-5 Units Subcutaneous QHS  . insulin aspart protamine- aspart  30 Units Subcutaneous BID AC  . lisinopril  40 mg Oral Daily  . metoprolol tartrate  25 mg Oral BID  . pantoprazole  40 mg Oral BID  . sodium chloride flush  3 mL Intravenous Q12H  . sodium chloride flush  3 mL Intravenous Q12H   . sodium chloride 1 mL/kg/hr (12/23/15 0612)  . heparin 1,100 Units/hr (12/23/15 0200)    ASSESSMENT AND PLAN: Principal Problem:   NSTEMI (non-ST elevated myocardial infarction) (HCC) - for cath this pm, continue current therapy - with bradycardia while sleeping, no BB increase  Active Problems:   Diabetes mellitus, type 2 (HCC) - A1c is high, encourage dietary compliance - if CBGs run high here,  increase home insulin dose    Hypertension - BP poorly controlled - chlorthalidone held for cath - add amlodipine 5 mg - change metop 25 bid to coreg 6.25 BID    Hyperlipidemia due to type 2 diabetes mellitus (HCC) - continue high-dose statin - LFTs are ok    Hypokalemia - supplement and follow  Signed, Leanna BattlesBarrett, Rhonda , PA-C 7:53 AM 12/23/2015  Personally seen and examined. Agree with above. Appropriate changes made to note above.  Amlodipine to help with BP Cath this PM(post 2 stents [place])   Donato SchultzMark Deann Mclaine, MD

## 2015-12-23 NOTE — Progress Notes (Signed)
Patient complaining of SOB related to Brilinta. Lungs clear, O2 sats 100% on room air. Cardiac monitor SR with intermittent SB and Junctional Rhythm 30's to 50's. Patient questions about holding his breath and encouraged to breath. Dr. [place] paged and orders received for breathing treatment and will give xanax.

## 2015-12-23 NOTE — Interval H&P Note (Signed)
History and Physical Interval Note:  12/23/2015 2:47 PM  Antonio [name]  has presented today for surgery, with the diagnosis of n stemi  The various methods of treatment have been discussed with the patient and family. After consideration of risks, benefits and other options for treatment, the patient has consented to  Procedure(s): Left Heart Cath and Coronary Angiography (N/A) as a surgical intervention .  The patient's history has been reviewed, patient examined, no change in status, stable for surgery.  I have reviewed the patient's chart and labs.  Questions were answered to the patient's satisfaction.   Cath Lab Visit (complete for each Cath Lab visit)  Clinical Evaluation Leading to the Procedure:   ACS: Yes.    Non-ACS:    Anginal Classification: CCS IV  Anti-ischemic medical therapy: Minimal Therapy (1 class of medications)  Non-Invasive Test Results: No non-invasive testing performed  Prior CABG: No previous CABG        Theron Arista [facility] 12/23/2015 2:47 PM

## 2015-12-23 NOTE — Progress Notes (Signed)
    Paged by RN, Vernona Rieger. He has been having significantly elevated Bps as well as some bradycardia. On coreg 6.25mg  BID and lisinopril 40mg  daily. GIven IV hydralazine 10mg  x1. Will start amldipine 5mg  daily and we can titrate up to 10mg  daily if needed.   Cline Crock PA-C  [facility]

## 2015-12-23 NOTE — Research (Signed)
Presented TWILIGHT Research study. Patient and family are not interested in participating due to previous GI bleeding on Plavix.

## 2015-12-23 NOTE — Care Management Note (Addendum)
Case Management Note  Patient Details  Name: Antonio [name] MRN: [id number] Date of Birth: Jun 09, 1960  Subjective/Objective:   S/p coronary intervention, patient will be on brilinta , or may be a candidate for twilight per MD note.  NCM will cont to follow for dc needs.  NCM will need to fax dc summary and new med script to Dr. Loleta Books [phone] on Monday.                9/25 NCM faxed d/summary and copy of scripts to [facility] PCP .   Action/Plan:   Expected Discharge Date:                  Expected Discharge Plan:  Home/Self Care  In-House Referral:     Discharge planning Services  CM Consult  Post Acute Care Choice:    Choice offered to:     DME Arranged:    DME Agency:     HH Arranged:    HH Agency:     Status of Service:  Completed, signed off  If discussed at [employer] of Stay Meetings, dates discussed:    Additional Comments:  Leone Haven, RN 12/23/2015, 4:16 PM

## 2015-12-24 ENCOUNTER — Other Ambulatory Visit: Payer: Self-pay | Admitting: Cardiology

## 2015-12-24 DIAGNOSIS — E876 Hypokalemia: Secondary | ICD-10-CM

## 2015-12-24 LAB — BASIC METABOLIC PANEL
Anion gap: 10 (ref 5–15)
CHLORIDE: 104 mmol/L (ref 101–111)
CO2: 21 mmol/L — AB (ref 22–32)
CREATININE: 0.8 mg/dL (ref 0.61–1.24)
Calcium: 9.3 mg/dL (ref 8.9–10.3)
GFR calc Af Amer: >60 mL/min (ref >60)
GFR calc non Af Amer: >60 mL/min (ref >60)
GLUCOSE: 193 mg/dL — AB (ref 65–99)
POTASSIUM: 3.1 mmol/L — AB (ref 3.5–5.1)
SODIUM: 135 mmol/L (ref 135–145)

## 2015-12-24 LAB — CBC
HEMATOCRIT: 42.8 % (ref 39.0–52.0)
Hemoglobin: 14.2 g/dL (ref 13.0–17.0)
MCH: 27.3 pg (ref 26.0–34.0)
MCHC: 33.2 g/dL (ref 30.0–36.0)
MCV: 82.1 fL (ref 78.0–100.0)
Platelets: 252 10*3/uL (ref 150–400)
RBC: 5.21 MIL/uL (ref 4.22–5.81)
RDW: 15.1 % (ref 11.5–15.5)
WBC: 8.5 10*3/uL (ref 4.0–10.5)

## 2015-12-24 LAB — GLUCOSE, CAPILLARY: GLUCOSE-CAPILLARY: 195 mg/dL — AB (ref 65–99)

## 2015-12-24 MED ORDER — CLOPIDOGREL BISULFATE 75 MG PO TABS
300.0000 mg | ORAL_TABLET | Freq: Once | ORAL | Status: AC
  Administered 2015-12-24: 10:00:00 300 mg via ORAL
  Filled 2015-12-24: qty 4

## 2015-12-24 MED ORDER — ATORVASTATIN CALCIUM 80 MG PO TABS: 80.0000 mg | ORAL_TABLET | Freq: Every day | ORAL | 12 refills | Status: DC

## 2015-12-24 MED ORDER — ASPIRIN EC 81 MG PO TBEC: 81.0000 mg | DELAYED_RELEASE_TABLET | Freq: Every day | ORAL | 0 refills | Status: DC

## 2015-12-24 MED ORDER — CLOPIDOGREL BISULFATE 75 MG PO TABS: 75.0000 mg | ORAL_TABLET | Freq: Every day | ORAL | Status: DC

## 2015-12-24 MED ORDER — CARVEDILOL 6.25 MG PO TABS: 6.2500 mg | ORAL_TABLET | Freq: Two times a day (BID) | ORAL | 12 refills | Status: DC

## 2015-12-24 MED ORDER — HYDROCHLOROTHIAZIDE 25 MG PO TABS: 25.0000 mg | ORAL_TABLET | Freq: Every day | ORAL | 12 refills | Status: DC

## 2015-12-24 MED ORDER — EZETIMIBE 10 MG PO TABS
10.0000 mg | ORAL_TABLET | Freq: Every day | ORAL | Status: DC
  Administered 2015-12-24: 10 mg via ORAL
  Filled 2015-12-24: qty 1

## 2015-12-24 MED ORDER — POTASSIUM CHLORIDE CRYS ER 20 MEQ PO TBCR
20.0000 meq | EXTENDED_RELEASE_TABLET | Freq: Every day | ORAL | 12 refills | Status: DC
Start: 2015-12-25 — End: 2015-12-24

## 2015-12-24 MED ORDER — LISINOPRIL 40 MG PO TABS: 40.0000 mg | ORAL_TABLET | Freq: Every day | ORAL | 12 refills | Status: DC

## 2015-12-24 MED ORDER — POTASSIUM CHLORIDE CRYS ER 20 MEQ PO TBCR: 20.0000 meq | EXTENDED_RELEASE_TABLET | Freq: Every day | ORAL | 12 refills | Status: DC

## 2015-12-24 MED ORDER — AMLODIPINE BESYLATE 5 MG PO TABS: 5.0000 mg | ORAL_TABLET | Freq: Every day | ORAL | 12 refills | Status: DC

## 2015-12-24 MED ORDER — GLUCERNA SHAKE PO LIQD
237.0000 mL | Freq: Three times a day (TID) | ORAL | Status: DC
  Administered 2015-12-24: 237 mL via ORAL
  Filled 2015-12-24 (×5): qty 237

## 2015-12-24 MED ORDER — CLOPIDOGREL BISULFATE 75 MG PO TABS: 75.0000 mg | ORAL_TABLET | Freq: Every day | ORAL | 12 refills | Status: DC

## 2015-12-24 MED ORDER — POTASSIUM CHLORIDE CRYS ER 20 MEQ PO TBCR: 20.0000 meq | EXTENDED_RELEASE_TABLET | Freq: Every day | ORAL | 11 refills | Status: DC

## 2015-12-24 MED ORDER — CARVEDILOL 6.25 MG PO TABS
6.2500 mg | ORAL_TABLET | Freq: Two times a day (BID) | ORAL | 11 refills | Status: DC
Start: 2015-12-24 — End: 2015-12-28

## 2015-12-24 MED ORDER — HYDROCHLOROTHIAZIDE 25 MG PO TABS: 25.0000 mg | ORAL_TABLET | Freq: Every day | ORAL | 11 refills | Status: DC

## 2015-12-24 MED ORDER — ATORVASTATIN CALCIUM 80 MG PO TABS: 80.0000 mg | ORAL_TABLET | Freq: Every day | ORAL | 11 refills | Status: DC

## 2015-12-24 MED ORDER — CLOPIDOGREL BISULFATE 75 MG PO TABS: 75.0000 mg | ORAL_TABLET | Freq: Every day | ORAL | 11 refills | Status: DC

## 2015-12-24 MED ORDER — ASPIRIN 81 MG PO CHEW: 81.0000 mg | CHEWABLE_TABLET | Freq: Every day | ORAL | Status: DC

## 2015-12-24 MED ORDER — HYDROCHLOROTHIAZIDE 25 MG PO TABS
25.0000 mg | ORAL_TABLET | Freq: Every day | ORAL | Status: DC
  Administered 2015-12-24: 10:00:00 25 mg via ORAL
  Filled 2015-12-24: qty 1

## 2015-12-24 NOTE — Progress Notes (Signed)
TR BAND REMOVAL  LOCATION:    right radial  DEFLATED PER PROTOCOL:    Yes.    TIME BAND OFF / DRESSING APPLIED:    0000    SITE UPON ARRIVAL:    Level 0  SITE AFTER BAND REMOVAL:    Level 0  CIRCULATION SENSATION AND MOVEMENT:    Within Normal Limits   Yes.    COMMENTS:   Pt tolerated well; delayed pulling air due to oozing at the site when blood pressure was elevated.

## 2015-12-24 NOTE — Discharge Summary (Signed)
Discharge Summary    Patient ID: Antonio [name],  MRN: [id number], DOB/AGE: 56/10/1960 56 y.o.  Admit date: 12/22/2015 Discharge date: 12/24/2015  Primary Care Provider: Pcp Not In System Primary Cardiologist: Dr. Anne FuSkains  Discharge Diagnoses    Principal Problem:   NSTEMI (non-ST elevated myocardial infarction) [facility](HCC) Active Problems:   Diabetes mellitus, type 2 (HCC)   Hypertension   Hyperlipidemia due to type 2 diabetes mellitus (HCC)   Allergies No Known Allergies  Diagnostic Studies/Procedures   Coronary Stent Intervention  Left Heart Cath and Coronary Angiography    Ost LM lesion, 20 %stenosed.  Ost LAD lesion, 40 %stenosed.  Stent to the first diagonal, 0 %stenosed.  STent to 1st Mrg lesion, 0 %stenosed.  Stent to Dist RCA lesion, 0 %stenosed.  The left ventricular systolic function is normal.  LV end diastolic pressure is normal.  The left ventricular ejection fraction is 55-65% by visual estimate.  A STENT PROMUS PREM MR 3.0X16 drug eluting stent was successfully placed.  Mid RCA lesion, 95 %stenosed.  Post intervention, there is a 0% residual stenosis.  A STENT PROMUS PREM MR 3.5X12 drug eluting stent was successfully placed.  Ost RCA to Prox RCA lesion, 80 %stenosed.  Post intervention, there is a 0% residual stenosis.  1. Single vessel obstructive CAD- 80% proximal RCA, 95% mid RCA 2. Patent old stents in the first diagonal, first OM, and distal RCA 3. Normal LV function with normal LVEDP 4. Successful stenting of the mid RCA with a DES 5. Successful stenting of the proximal RCA with DES  Plan: DAPT. ASA Only 81 mg daily and Brilinta 90 mg bid. Patient may be a candidate for the TWILIGHT trial. Anticipate DC in am.      _____________   History of Present Illness   Antonio [name] is a 55 y.o. male with a history of DM, HTN, HLD, FH CAD, remote tobacco use.   Pt had been doing ok, but about a month ago, began having chest pain. At rest  and with exertion. It was aching pain 8/10 at its worst. The symptoms gradually increased in frequency and intensity. The pain would be relieved by SL NTG x 1. As the symptoms progressed, he would require nitrates almost every day, sometimes more than once. He would get diaphoretic, no SOB, no N&V.   At a regularly scheduled appointment, he mentioned the chest pain. He was pain-free then, and they asked him to drive to [facility] for admission. He and his wife felt more urgent care was needed and hi BP was very high, so they went to [facility]. He developed more chest pain going to [facility].  At [facility], he got ASA 324 mg, IV Heparin and NTG paste. His troponin was elevated so he was [facility]. He is currently pain-free. No Kdur was given.  He has been having more GERD sx recently. No bleeding since 2014 (diverticular bleed, Effient d/c'd).     Hospital Course  His troponin was elevated at 0.40>>0.24>>0.27. He underwent left heart cath on 12/23/15. Full report above. He had a proximal and Mid RCA lesion that was treated with 2 drug-eluting stents.  Following the administration of Brilinta post cath he develop shortness of breath. He was given additional dose of Brilinta approximately 5 hours later and the symptoms persisted. He said that he would not be able to tolerate the Brilinta so he was loaded with 300 mg of Plavix. And will start daily Plavix tomorrow.  Prior to admission he was on Tenoretic, this was changed to Coreg and HCTZ. He was hypokalemic on admission daily potassium supplementation was added to his regimen. He will need a BMP in the next 7-10 days as he is on HCTZ, and an ACE.   His A1c this admission was 12.4. He tells me that he follows at the [place] for his diabetes. He is going to follow-up with them this coming week and is aware that his A1c is high.  His statin was changed to high intensity this admission. He was previously on Crestor he was changed to 80 mg of   atorvastatin. He will need fasting lipid panel and hepatic function panel in 6 weeks.  We will follow-up with him in the next 7-10 days. He was seen today by Dr. Mayford Knife and deemed suitable for discharge. His right radial site was stable without hematoma.    _____________  Discharge Vitals Blood pressure (!) 145/80, pulse 94, temperature 98.2 F (36.8 C), temperature source Oral, resp. rate 14, height 5\' 7"  (1.702 m), weight 205 lb 0.4 oz (93 kg), SpO2 96 %.  Filed Weights   12/22/15 1608 12/23/15 0537 12/24/15 0414  Weight: 191 lb 9.6 oz (86.9 kg) 191 lb 11.2 oz (87 kg) 205 lb 0.4 oz (93 kg)    Labs & Radiologic Studies     CBC  Recent Labs  12/22/15 1050 12/23/15 0421 12/24/15 0537  WBC 6.5 7.1 8.5  NEUTROABS 3.7  --   --   HGB 13.0 13.1 14.2  HCT 37.6* 39.9 42.8  MCV 80.2 81.9 82.1  PLT 240 234 252   Basic Metabolic Panel  Recent Labs  12/23/15 0421 12/24/15 0537  NA 136 135  K 3.2* 3.1*  CL 102 104  CO2 26 21*  GLUCOSE 167* 193*  BUN 8 <5*  CREATININE 0.85 0.80  CALCIUM 8.9 9.3   Liver Function Tests  Recent Labs  12/22/15 1050 12/23/15 0421  AST 17 17  ALT 13* 14*  ALKPHOS 80 75  BILITOT 0.9 0.9  PROT 7.7 6.6  ALBUMIN 4.1 3.3*   Cardiac Enzymes  Recent Labs  12/22/15 1804 12/22/15 2309 12/23/15 0421  TROPONINI 0.24* 0.24* 0.27*   Hemoglobin A1C  Recent Labs  12/22/15 1804  HGBA1C 12.4*   Fasting Lipid Panel  Recent Labs  12/23/15 0421  CHOL 188  HDL 39*  LDLCALC 121*  TRIG 138  CHOLHDL 4.8    Dg Chest Port 1 View  Result Date: 12/22/2015 CLINICAL DATA:  Chest pain for several weeks, worsening. EXAM: PORTABLE CHEST 1 VIEW COMPARISON:  PA and lateral chest 10/23/2012. FINDINGS: The lungs are clear. Heart size is normal. No pneumothorax or pleural effusion. No bony abnormality. IMPRESSION: Negative chest. Electronically Signed   By: Drusilla Kanner M.D.   On: 12/22/2015 11:07    Disposition   Pt is being discharged home  today in good condition.  Follow-up Plans & Appointments    Follow-up Information    [facility] Office .   Specialty:  Cardiology Why:  Our office will call you to schedule an appointment.  Contact information: [address] [place] [address] [phone]         Discharge Instructions    AMB Referral to Cardiac Rehabilitation - Phase II    Complete by:  As directed    Diagnosis:  NSTEMI   Amb Referral to Cardiac Rehabilitation    Complete by:  As directed  Diagnosis:   Coronary Stents Comment - to [place] NSTEMI     Diet - low sodium heart healthy    Complete by:  As directed    Discharge instructions    Complete by:  As directed    NO HEAVY LIFTING OR SEXUAL ACTIVITY for 7 DAYS. NO DRIVING for 3-5 DAYS. NO SOAKING BATHS, HOT TUBS, POOLS, ETC., for 7 DAYS  Radial Site Care Refer to this sheet in the next few weeks. These instructions provide you with information on caring for yourself after your procedure. Your caregiver may also give you more specific instructions. Your treatment has been planned according to current medical practices, but problems sometimes occur. Call your caregiver if you have any problems or questions after your procedure. HOME CARE INSTRUCTIONS You may shower the day after the procedure.Remove the bandage (dressing) and gently wash the site with plain soap and water.Gently pat the site dry.  Do not apply powder or lotion to the site.  Do not submerge the affected site in water for 3 to 5 days.  Inspect the site at least twice daily.  Do not flex or bend the affected arm for 24 hours.  No lifting over 5 pounds (2.3 kg) for 5 days after your procedure.  Do not drive home if you are discharged the same day of the procedure. Have someone else drive you.  You may drive 24 hours after the procedure unless otherwise instructed by your caregiver.  What to expect: Any bruising will usually fade within 1  to 2 weeks.  Blood that collects in the tissue (hematoma) may be painful to the touch. It should usually decrease in size and tenderness within 1 to 2 weeks.  SEEK IMMEDIATE MEDICAL CARE IF: You have unusual pain at the radial site.  You have redness, warmth, swelling, or pain at the radial site.  You have drainage (other than a small amount of blood on the dressing).  You have chills.  You have a fever or persistent symptoms for more than 72 hours.  You have a fever and your symptoms suddenly get worse.  Your arm becomes pale, cool, tingly, or numb.  You have heavy bleeding from the site. Hold pressure on the site.   Increase activity slowly    Complete by:  As directed       Discharge Medications   Current Discharge Medication List    START taking these medications   Details  amLODipine (NORVASC) 5 MG tablet Take 1 tablet (5 mg total) by mouth daily. Qty: 30 tablet, Refills: 12    atorvastatin (LIPITOR) 80 MG tablet Take 1 tablet (80 mg total) by mouth daily at 6 PM. Qty: 30 tablet, Refills: 12    carvedilol (COREG) 6.25 MG tablet Take 1 tablet (6.25 mg total) by mouth 2 (two) times daily with a meal. Qty: 60 tablet, Refills: 12    clopidogrel (PLAVIX) 75 MG tablet Take 1 tablet (75 mg total) by mouth daily. Qty: 30 tablet, Refills: 12    hydrochlorothiazide (HYDRODIURIL) 25 MG tablet Take 1 tablet (25 mg total) by mouth daily. Qty: 30 tablet, Refills: 12    potassium chloride SA (K-DUR,KLOR-CON) 20 MEQ tablet Take 1 tablet (20 mEq total) by mouth daily. Qty: 30 tablet, Refills: 12      CONTINUE these medications which have CHANGED   Details  aspirin EC 81 MG tablet Take 1 tablet (81 mg total) by mouth daily. Qty: 1 tablet, Refills: 0  CONTINUE these medications which have NOT CHANGED   Details  docusate sodium 100 MG CAPS Take 100 mg by mouth 2 (two) times daily as needed for constipation. Qty: 60 capsule, Refills: 1    insulin aspart protamine - aspart  (NOVOLOG MIX 70/30 FLEXPEN) (70-30) 100 UNIT/ML FlexPen Inject 30 Units into the skin 2 (two) times daily before a meal.    lisinopril (PRINIVIL,ZESTRIL) 40 MG tablet Take 40 mg by mouth daily.    metFORMIN (GLUCOPHAGE-XR) 500 MG 24 hr tablet Take 1,000 mg by mouth 2 (two) times daily.    nitroGLYCERIN (NITROSTAT) 0.4 MG SL tablet Place 0.4 mg under the tongue every 5 (five) minutes as needed for chest pain.    pantoprazole (PROTONIX) 40 MG tablet Take 40 mg by mouth daily as needed. For heartburn      STOP taking these medications     atenolol-chlorthalidone (TENORETIC) 50-25 MG per tablet      simvastatin (ZOCOR) 20 MG tablet          Aspirin prescribed at discharge?  Yes High Intensity Statin Prescribed? (Lipitor 40-80mg  or Crestor 20-40mg ): Yes Beta Blocker Prescribed? Yes For EF 40% or less, Was ACEI/ARB Prescribed? No: EF normal  ADP Receptor Inhibitor Prescribed? (i.e. Plavix etc.-Includes Medically Managed Patients): Yes For EF <40%, Aldosterone Inhibitor Prescribed? No: EF normal Was EF assessed during THIS hospitalization? Yes Was Cardiac Rehab II ordered? (Included Medically managed Patients): Yes   Outstanding Labs/Studies   FLP and hepatic function.   Duration of Discharge Encounter   Greater than 30 minutes including physician time.  Signed, Little Ishikawa NP 12/24/2015, 10:24 AM

## 2015-12-24 NOTE — Progress Notes (Signed)
Patient Name: Antonio [name] Date of Encounter: 12/24/2015  Primary Cardiologist: Dr. [facility] Problem List     Principal Problem:   NSTEMI (non-ST elevated myocardial infarction) [facility]) Active Problems:   Diabetes mellitus, type 2 (HCC)   Hypertension   Hyperlipidemia due to type 2 diabetes mellitus (HCC)     Subjective   Feels SOB, slightly anxious. Denies chest pain and palpitations.   Inpatient Medications    Scheduled Meds: . amLODipine  5 mg Oral Daily  . aspirin  81 mg Oral Daily  . atorvastatin  80 mg Oral q1800  . carvedilol  6.25 mg Oral BID WC  . insulin aspart  0-15 Units Subcutaneous TID WC  . insulin aspart  0-5 Units Subcutaneous QHS  . insulin aspart protamine- aspart  30 Units Subcutaneous BID AC  . lisinopril  40 mg Oral Daily  . pantoprazole  40 mg Oral BID  . potassium chloride  20 mEq Oral Daily  . sodium chloride flush  3 mL Intravenous Q12H  . sodium chloride flush  3 mL Intravenous Q12H  . ticagrelor  90 mg Oral BID   Continuous Infusions:  PRN Meds:.sodium chloride, acetaminophen, albuterol, ALPRAZolam, docusate sodium, hydrALAZINE, nitroGLYCERIN, ondansetron (ZOFRAN) IV, sodium chloride flush, sodium chloride flush, zolpidem   Vital Signs    Vitals:   12/24/15 0400 12/24/15 0414 12/24/15 0500 12/24/15 0600  BP: (!) 150/84 (!) 145/90 (!) 165/79 136/72  Pulse:  75    Resp: 16 13 17 19   Temp:  97.2 F (36.2 C)    TempSrc:  Axillary    SpO2:  99%    Weight:  205 lb 0.4 oz (93 kg)    Height:        Intake/Output Summary (Last 24 hours) at 12/24/15 0735 Last data filed at 12/24/15 4098  Gross per 24 hour  Intake          1317.75 ml  Output             2500 ml  Net         -1182.25 ml   Filed Weights   12/22/15 1608 12/23/15 0537 12/24/15 0414  Weight: 191 lb 9.6 oz (86.9 kg) 191 lb 11.2 oz (87 kg) 205 lb 0.4 oz (93 kg)    Physical Exam   GEN: Well nourished, well developed, in no acute distress.  HEENT: Grossly normal.    Neck: Supple, no JVD, carotid bruits, or masses. Cardiac: RRR, no murmurs, rubs, or gallops. No clubbing, cyanosis, edema.  Radials/DP/PT 2+ and equal bilaterally.  Respiratory:  Respirations regular and unlabored, clear to auscultation bilaterally. GI: Soft, nontender, nondistended, BS + x 4. MS: no deformity or atrophy. Skin: warm and dry, no rash. Neuro:  Strength and sensation are intact. Psych: AAOx3.  Normal affect.  Labs    CBC  Recent Labs  12/22/15 1050 12/23/15 0421 12/24/15 0537  WBC 6.5 7.1 8.5  NEUTROABS 3.7  --   --   HGB 13.0 13.1 14.2  HCT 37.6* 39.9 42.8  MCV 80.2 81.9 82.1  PLT 240 234 252   Basic Metabolic Panel  Recent Labs  12/23/15 0421 12/24/15 0537  NA 136 135  K 3.2* 3.1*  CL 102 104  CO2 26 21*  GLUCOSE 167* 193*  BUN 8 <5*  CREATININE 0.85 0.80  CALCIUM 8.9 9.3   Liver Function Tests  Recent Labs  12/22/15 1050 12/23/15 0421  AST 17 17  ALT 13* 14*  ALKPHOS  80 75  BILITOT 0.9 0.9  PROT 7.7 6.6  ALBUMIN 4.1 3.3*   Cardiac Enzymes  Recent Labs  12/22/15 1804 12/22/15 2309 12/23/15 0421  TROPONINI 0.24* 0.24* 0.27*  Hemoglobin A1C  Recent Labs  12/22/15 1804  HGBA1C 12.4*   Fasting Lipid Panel  Recent Labs  12/23/15 0421  CHOL 188  HDL 39*  LDLCALC 121*  TRIG 138  CHOLHDL 4.8     Telemetry    NSR, Sinus bradycardia - Personally Reviewed  ECG    Sinus bradycardia, t wave inversion in lateral leads - Personally Reviewed  Radiology    Dg Chest Port 1 View  Result Date: 12/22/2015 CLINICAL DATA:  Chest pain for several weeks, worsening. EXAM: PORTABLE CHEST 1 VIEW COMPARISON:  PA and lateral chest 10/23/2012. FINDINGS: The lungs are clear. Heart size is normal. No pneumothorax or pleural effusion. No bony abnormality. IMPRESSION: Negative chest. Electronically Signed   By: Drusilla Kannerhomas  Dalessio M.D.   On: 12/22/2015 11:07     Cardiac Studies   Coronary Stent Intervention  Left Heart Cath and Coronary  Angiography    Ost LM lesion, 20 %stenosed.  Ost LAD lesion, 40 %stenosed.  Stent to the first diagonal, 0 %stenosed.  STent to 1st Mrg lesion, 0 %stenosed.  Stent to Dist RCA lesion, 0 %stenosed.  The left ventricular systolic function is normal.  LV end diastolic pressure is normal.  The left ventricular ejection fraction is 55-65% by visual estimate.  A STENT PROMUS PREM MR 3.0X16 drug eluting stent was successfully placed.  Mid RCA lesion, 95 %stenosed.  Post intervention, there is a 0% residual stenosis.  A STENT PROMUS PREM MR 3.5X12 drug eluting stent was successfully placed.  Ost RCA to Prox RCA lesion, 80 %stenosed.  Post intervention, there is a 0% residual stenosis.   1. Single vessel obstructive CAD- 80% proximal RCA, 95% mid RCA 2. Patent old stents in the first diagonal, first OM, and distal RCA 3. Normal LV function with normal LVEDP 4. Successful stenting of the mid RCA with a DES 5. Successful stenting of the proximal RCA with DES  Plan: DAPT. ASA Only 81 mg daily and Brilinta 90 mg bid. Patient may be a candidate for the TWILIGHT trial. Anticipate DC in am.     Patient Profile     Mr. Antonio [name] is a 55 year old male with a past medical history of DM, HTN, HLD, CAD. He was admitted with NSTEMI on 12/22/15. Left heart cath showed 80% proximal RCA stenosis and 95% mid RCA stenosis. This was treated with a DES.   Assessment & Plan    1. CAD s/p DESx 2 placement to RCA: Chest pain free overnight. Had some bradycardia and periods of junctional rhythm that has resolved.   He is feeling SOB, this began shortly after his first dose of Brilinta. Does not appear to be in any respiratory distress. He tells me that he doesn't think that he can tolerate Brilinta. We will need to switch him to another anti-platelet.   He also has stents in his first diagonal, first OM, and distal RCA those were all noted to be patent.  Continue ASA, and high intensity statin.  Continue beta blocker, bradycardia has resolved.   2. HTN: Hypertensive, he is on ACE-I, amlodipine, and Coreg. Will put back on his diuretic HCTZ.  3. DM: A1c this admission was 12.4. He tells me that [facility]. He was seen less  than a week ago at the [place], and they did draw an A1c.   4. Hypokalemia: He is not eating much according to nursing. Will give now. He was not on K suppl at home so will add daily Kdur and repeat BMET as outpt.   5. HLD: LDL is 121. Changed to high dose statin this admit.  Will repeat FLp and ALT in 6 weeks.   Signed, Little Ishikawa, NP  12/24/2015, 7:35 AM   Patient seen and independently examined with Suzzette Righter NP. We discussed all aspects of the encounter. I agree with the assessment and plan as stated above.  Patient stable from a cardiac standpoint for discharge.  His statin was changed to high dose and will repeat FLP in 6 weeks.  Changed Tenoretic to Coreg and HCTZ.  Placed on daily K suppl as potassium was low on admit.  He did not tolerate brilinta due to SOB so will load wit Plavix 300mg  today and send home on Plavix 75mg  daily.  Early TOC followup.  Armanda Magic, MD [facility] 12/24/2015

## 2015-12-24 NOTE — Progress Notes (Signed)
CARDIAC REHAB PHASE I   PRE:  Rate/Rhythm: 101 ST  BP:  Sitting: 164/91        SaO2: 99 RA  MODE:  Ambulation: 500 ft   POST:  Rate/Rhythm: 120 ST  BP:  Sitting: 179/110, 151/91 recheck after 10 minutes rest         SaO2: 100 RA  Pt ambulated 500 ft on RA, handheld assist, steady gait, tolerated well.  Pt c/o of mild "lightheadness" at rest and with activity, denies cp, DOE, declined rest stop. Pt BP remains somewhat elevated. Completed MI/stent education with pt and wife at bedside.  Reviewed risk factors, MI book, anti-platelet therapy, stent card (unable to locate, RN notified), activity restrictions, ntg, exercise, heart healthy diet, carb counting, portion control, and phase 2 cardiac rehab. Pt verbalized understanding. Pt agrees to phase 2 cardiac rehab referral, will send to [facility] per pt request. Pt to bed per pt request after walk, call bell within reach.  [phone] Joylene Grapes, RN, BSN 12/24/2015 9:21 AM

## 2015-12-26 ENCOUNTER — Encounter (HOSPITAL_COMMUNITY): Payer: Self-pay | Admitting: Cardiology

## 2015-12-26 ENCOUNTER — Inpatient Hospital Stay (HOSPITAL_COMMUNITY)
Admission: EM | Admit: 2015-12-26 | Discharge: 2015-12-28 | DRG: 684 | Disposition: A | Discharge status: Home / Self Care | Payer: Non-veteran care | Attending: Internal Medicine | Admitting: Internal Medicine

## 2015-12-26 ENCOUNTER — Emergency Department (HOSPITAL_COMMUNITY): Payer: Non-veteran care

## 2015-12-26 DIAGNOSIS — Z794 Long term (current) use of insulin: Secondary | ICD-10-CM | POA: Diagnosis not present

## 2015-12-26 DIAGNOSIS — I952 Hypotension due to drugs: Secondary | ICD-10-CM | POA: Diagnosis not present

## 2015-12-26 DIAGNOSIS — E1169 Type 2 diabetes mellitus with other specified complication: Secondary | ICD-10-CM | POA: Diagnosis not present

## 2015-12-26 DIAGNOSIS — N179 Acute kidney failure, unspecified: Secondary | ICD-10-CM | POA: Diagnosis present

## 2015-12-26 DIAGNOSIS — E860 Dehydration: Secondary | ICD-10-CM | POA: Diagnosis present

## 2015-12-26 DIAGNOSIS — T50995A Adverse effect of other drugs, medicaments and biological substances, initial encounter: Secondary | ICD-10-CM | POA: Diagnosis present

## 2015-12-26 DIAGNOSIS — I2583 Coronary atherosclerosis due to lipid rich plaque: Secondary | ICD-10-CM | POA: Diagnosis present

## 2015-12-26 DIAGNOSIS — R079 Chest pain, unspecified: Secondary | ICD-10-CM | POA: Diagnosis not present

## 2015-12-26 DIAGNOSIS — T502X5A Adverse effect of carbonic-anhydrase inhibitors, benzothiadiazides and other diuretics, initial encounter: Secondary | ICD-10-CM | POA: Diagnosis present

## 2015-12-26 DIAGNOSIS — R55 Syncope and collapse: Secondary | ICD-10-CM

## 2015-12-26 DIAGNOSIS — E861 Hypovolemia: Secondary | ICD-10-CM | POA: Diagnosis present

## 2015-12-26 DIAGNOSIS — Z7982 Long term (current) use of aspirin: Secondary | ICD-10-CM

## 2015-12-26 DIAGNOSIS — Z8249 Family history of ischemic heart disease and other diseases of the circulatory system: Secondary | ICD-10-CM

## 2015-12-26 DIAGNOSIS — Z9049 Acquired absence of other specified parts of digestive tract: Secondary | ICD-10-CM | POA: Diagnosis not present

## 2015-12-26 DIAGNOSIS — Z87891 Personal history of nicotine dependence: Secondary | ICD-10-CM | POA: Diagnosis not present

## 2015-12-26 DIAGNOSIS — I10 Essential (primary) hypertension: Secondary | ICD-10-CM | POA: Diagnosis present

## 2015-12-26 DIAGNOSIS — I2510 Atherosclerotic heart disease of native coronary artery without angina pectoris: Secondary | ICD-10-CM | POA: Diagnosis present

## 2015-12-26 DIAGNOSIS — A084 Viral intestinal infection, unspecified: Secondary | ICD-10-CM | POA: Diagnosis present

## 2015-12-26 DIAGNOSIS — K219 Gastro-esophageal reflux disease without esophagitis: Secondary | ICD-10-CM | POA: Diagnosis present

## 2015-12-26 DIAGNOSIS — I959 Hypotension, unspecified: Secondary | ICD-10-CM | POA: Diagnosis present

## 2015-12-26 DIAGNOSIS — Z9861 Coronary angioplasty status: Secondary | ICD-10-CM | POA: Diagnosis not present

## 2015-12-26 DIAGNOSIS — E785 Hyperlipidemia, unspecified: Secondary | ICD-10-CM | POA: Diagnosis present

## 2015-12-26 DIAGNOSIS — Z7984 Long term (current) use of oral hypoglycemic drugs: Secondary | ICD-10-CM | POA: Diagnosis not present

## 2015-12-26 DIAGNOSIS — E119 Type 2 diabetes mellitus without complications: Secondary | ICD-10-CM

## 2015-12-26 DIAGNOSIS — Z955 Presence of coronary angioplasty implant and graft: Secondary | ICD-10-CM

## 2015-12-26 DIAGNOSIS — Z820 Family history of epilepsy and other diseases of the nervous system: Secondary | ICD-10-CM | POA: Diagnosis not present

## 2015-12-26 DIAGNOSIS — E782 Mixed hyperlipidemia: Secondary | ICD-10-CM | POA: Diagnosis present

## 2015-12-26 DIAGNOSIS — R011 Cardiac murmur, unspecified: Secondary | ICD-10-CM | POA: Diagnosis present

## 2015-12-26 DIAGNOSIS — I252 Old myocardial infarction: Secondary | ICD-10-CM | POA: Diagnosis not present

## 2015-12-26 DIAGNOSIS — Z79899 Other long term (current) drug therapy: Secondary | ICD-10-CM | POA: Diagnosis not present

## 2015-12-26 LAB — COMPREHENSIVE METABOLIC PANEL
ALBUMIN: 3.8 g/dL (ref 3.5–5.0)
ALT: 33 U/L (ref 17–63)
AST: 34 U/L (ref 15–41)
Alkaline Phosphatase: 60 U/L (ref 38–126)
Anion gap: 12 (ref 5–15)
BILIRUBIN TOTAL: 0.9 mg/dL (ref 0.3–1.2)
BUN: 19 mg/dL (ref 6–20)
CHLORIDE: 104 mmol/L (ref 101–111)
CO2: 20 mmol/L — ABNORMAL LOW (ref 22–32)
CREATININE: 1.68 mg/dL — AB (ref 0.61–1.24)
Calcium: 9.6 mg/dL (ref 8.9–10.3)
GFR calc Af Amer: 51 mL/min — ABNORMAL LOW (ref >60)
GFR, EST NON AFRICAN AMERICAN: 44 mL/min — AB (ref >60)
GLUCOSE: 147 mg/dL — AB (ref 65–99)
POTASSIUM: 3.8 mmol/L (ref 3.5–5.1)
Sodium: 136 mmol/L (ref 135–145)
Total Protein: 7.2 g/dL (ref 6.5–8.1)

## 2015-12-26 LAB — I-STAT TROPONIN, ED: Troponin i, poc: 0.08 ng/mL (ref 0.00–0.08)

## 2015-12-26 LAB — CBC WITH DIFFERENTIAL/PLATELET
BASOS ABS: 0 10*3/uL (ref 0.0–0.1)
BASOS PCT: 0 %
Eosinophils Absolute: 0.1 10*3/uL (ref 0.0–0.7)
Eosinophils Relative: 1 %
HEMATOCRIT: 43.1 % (ref 39.0–52.0)
Hemoglobin: 14 g/dL (ref 13.0–17.0)
LYMPHS PCT: 22 %
Lymphs Abs: 2.8 10*3/uL (ref 0.7–4.0)
MCH: 27.2 pg (ref 26.0–34.0)
MCHC: 32.5 g/dL (ref 30.0–36.0)
MCV: 83.9 fL (ref 78.0–100.0)
MONO ABS: 0.9 10*3/uL (ref 0.1–1.0)
Monocytes Relative: 8 %
NEUTROS ABS: 8.7 10*3/uL — AB (ref 1.7–7.7)
Neutrophils Relative %: 69 %
PLATELETS: 216 10*3/uL (ref 150–400)
RBC: 5.14 MIL/uL (ref 4.22–5.81)
RDW: 15.6 % — AB (ref 11.5–15.5)
WBC: 12.5 10*3/uL — AB (ref 4.0–10.5)

## 2015-12-26 MED ORDER — ASPIRIN 81 MG PO CHEW: 324.0000 mg | CHEWABLE_TABLET | Freq: Once | ORAL | Status: DC

## 2015-12-26 MED ORDER — SODIUM CHLORIDE 0.9 % IV BOLUS (SEPSIS)
500.0000 mL | Freq: Once | INTRAVENOUS | Status: AC
  Administered 2015-12-26: 500 mL via INTRAVENOUS

## 2015-12-26 MED ORDER — SODIUM CHLORIDE 0.9 % IV BOLUS (SEPSIS)
1000.0000 mL | Freq: Once | INTRAVENOUS | Status: AC
  Administered 2015-12-27: 1000 mL via INTRAVENOUS

## 2015-12-26 NOTE — ED Notes (Signed)
Pt resting with eyes closed, no complaints voiced.  Color appropriate, skin warm and dry.  Wife remains at bedside

## 2015-12-26 NOTE — H&P (Signed)
History and Physical  Patient Name: Antonio [name]     ASN:[id number]    DOB: October 30, 1960    DOA: 12/26/2015 PCP: Pcp Not In System Dr. Loleta Books at the [facility]  Patient coming from: Home  Chief Complaint: Sweats, presyncope  HPI: Antonio [name] is a 55 y.o. male with a past medical history significant for HTN, NIDDM, CAD with recent DES x2 who presents with presyncope and hypotension and AKI.  The patient was in his usual state of health until the last month when he developed accelerating angina, ultimately presented to [facility] with elevated troponin and found to have NSTEMI.  Had early intervention with [facility] 4 days ago, at which he was found to have discrete RCA disease and received DES to RCA x2.  Post cath, he had no complications, and was discharged 2 days ago to home with his wife.  Since discharge, he has been home, no new symptoms, maybe eating less because of strict dietary changes.  He was able to get out of the house and has been up and around without symptoms until tonight he finished dinner, had a sherbet cone, and then felt nauseated.  He had two loose brown urgent stools, then went to lay down because he still felt vague malaise.  He called his wife home from her mother's house nearby because "he felt really bad", had drenching sweats, discomfort in both shoulders.  When she got home, she found him on the bed, sweaty, sluggish, slightly less responsive than usual and called 9-1-1.  EMS found him hypotensive with normal blood glucose and brought him to the ER.  ED course: -Afebrile, heart rate and respirations normal, BP 85/60 mmHg, pulse oximetry normal -Na 136, K 3.8, Cr 1.7 (baseline 0.9 at discharge 2 days ago), WBC 12.5K, Hgb 14 -Troponin negative -ECG with flipped T waves, and some borderline ST elevations; this was discussed with Dr. Shirlee Latch on call who recommended trending the troponins and doubted STEMI -The patient was given 1L NS and TRH were asked to evaluate for AKI and hypotension  suspected from new BP medications  The patient had previously been on atenolol, chlorthalidone, lisinopril, and had been discharged from the hospital on amlodipine, carvedilol, HCTZ, and lisinopril, plus his wife thinks that he has been eating less on his new diet.      ROS: Review of Systems  Constitutional: Positive for diaphoresis. Negative for chills, fever and malaise/fatigue.  Respiratory: Negative for cough, sputum production and shortness of breath.   Cardiovascular: Negative for chest pain, palpitations and leg swelling.  Genitourinary: Negative for dysuria, flank pain, frequency, hematuria and urgency.  Neurological: Positive for dizziness. Negative for loss of consciousness.  All other systems reviewed and are negative.         Past Medical History:  Diagnosis Date  . Diabetes (HCC)   . Diverticulosis   . GERD (gastroesophageal reflux disease)   . Heart attack [facility]) 2011   2 stents in [facility]  . Heart murmur   . Hemorrhoids   . Hyperlipidemia due to type 2 diabetes mellitus (HCC)   . Hypertension   . Stented coronary artery 2011    Past Surgical History:  Procedure Laterality Date  . CARDIAC CATHETERIZATION N/A 12/23/2015   Procedure: Left Heart Cath and Coronary Angiography;  Surgeon: Peter M Swaziland, MD;  Location: [facility] INVASIVE CV LAB;  Service: Cardiovascular;  Laterality: N/A;  . CARDIAC CATHETERIZATION N/A 12/23/2015   Procedure: Coronary Stent Intervention;  Surgeon: Peter M Swaziland, MD;  Location: [facility] INVASIVE CV LAB;  Service: Cardiovascular;  Laterality: N/A;  . COLON RESECTION     12 inches taken out in 2011 at [facility] for diverticulosis  . COLONOSCOPY N/A 10/18/2012   Procedure: COLONOSCOPY;  Surgeon: Louis Meckel, MD;  Location: [facility] ENDOSCOPY;  Service: Endoscopy;  Laterality: N/A;  . COLONOSCOPY N/A 10/22/2012   Procedure: COLONOSCOPY;  Surgeon: Iva Boop, MD;  Location: [facility] ENDOSCOPY;  Service: Endoscopy;  Laterality: N/A;  .  CORONARY ANGIOPLASTY WITH STENT PLACEMENT  2011   at [facility], 2 stents, locations unknown  . ESOPHAGOGASTRODUODENOSCOPY N/A 10/18/2012   Procedure: ESOPHAGOGASTRODUODENOSCOPY (EGD);  Surgeon: Louis Meckel, MD;  Location: [facility] ENDOSCOPY;  Service: Endoscopy;  Laterality: N/A;  . HERNIA REPAIR    . IRRIGATION AND DEBRIDEMENT ABSCESS N/A 10/26/2012   Procedure: IRRIGATION AND DEBRIDEMENT PERINEAL ABSCESS;  Surgeon: Wilmon Arms. Corliss Skains, MD;  Location: [facility] OR;  Service: General;  Laterality: N/A;    Social History: Patient lives with his wife.  The patient walks unassisted.  He is retired Electronics engineer.  He is from [facility].  He is a former smoker.    No Known Allergies  Family history: family history includes Alzheimer's disease (age of onset: 77) in his mother; CAD (age of onset: 63) in his brother; Heart attack (age of onset: 6) in his father.  Prior to Admission medications   Medication Sig Start Date End Date Taking? Authorizing Provider  amLODipine (NORVASC) 5 MG tablet Take 1 tablet (5 mg total) by mouth daily. 12/25/15  Yes Little Ishikawa, NP  aspirin EC 81 MG tablet Take 1 tablet (81 mg total) by mouth daily. 12/24/15  Yes Little Ishikawa, NP  atorvastatin (LIPITOR) 80 MG tablet Take 1 tablet (80 mg total) by mouth daily at 6 PM. 12/24/15  Yes Ellsworth Lennox, PA  carvedilol (COREG) 6.25 MG tablet Take 1 tablet (6.25 mg total) by mouth 2 (two) times daily with a meal. 12/24/15  Yes Ellsworth Lennox, PA  clopidogrel (PLAVIX) 75 MG tablet Take 1 tablet (75 mg total) by mouth daily. 12/25/15  Yes Ellsworth Lennox, PA  docusate sodium 100 MG CAPS Take 100 mg by mouth 2 (two) times daily as needed for constipation. Patient taking differently: Take 100 mg by mouth daily as needed (for constipation).  10/30/12  Yes Lorretta Harp, MD  hydrochlorothiazide (HYDRODIURIL) 25 MG tablet Take 1 tablet (25 mg total) by mouth daily. 12/25/15  Yes Ellsworth Lennox, PA  insulin aspart protamine - aspart  (NOVOLOG MIX 70/30 FLEXPEN) (70-30) 100 UNIT/ML FlexPen Inject 30 Units into the skin 2 (two) times daily before a meal.   Yes Historical Provider, MD  lisinopril (PRINIVIL,ZESTRIL) 40 MG tablet Take 1 tablet (40 mg total) by mouth daily. 12/24/15  Yes Little Ishikawa, NP  metFORMIN (GLUCOPHAGE-XR) 500 MG 24 hr tablet Take 1,000 mg by mouth 2 (two) times daily.   Yes Historical Provider, MD  nitroGLYCERIN (NITROSTAT) 0.4 MG SL tablet Place 0.4 mg under the tongue every 5 (five) minutes as needed for chest pain.   Yes Historical Provider, MD  pantoprazole (PROTONIX) 40 MG tablet Take 40 mg by mouth daily as needed. For heartburn   Yes Historical Provider, MD  potassium chloride SA (K-DUR,KLOR-CON) 20 MEQ tablet Take 1 tablet (20 mEq total) by mouth daily. 12/25/15  Yes Ellsworth Lennox, PA       Physical Exam: BP (!) 93/60 (BP Location: Left Arm)  Pulse 75   Temp 98.3 F (36.8 C) (Oral)   Ht 5\' 7"  (1.702 m)   Wt 88 kg (194 lb)   SpO2 100%   BMI 30.38 kg/m  General appearance: Well-developed, adult male, alert and in no acute distress, appears tired.   Eyes: Anicteric, conjunctiva pink, lids and lashes normal. PERRL.    ENT: No nasal deformity, discharge, epistaxis.  Hearing normal. OP moist without lesions.   Neck: No neck masses.  Trachea midline.  No thyromegaly/tenderness. Lymph: No cervical or supraclavicular lymphadenopathy. Skin: Warm and dry.  No suspicious rashes or lesions. Cardiac: RRR, nl S1-S2, no murmurs appreciated.  Capillary refill is brisk.  JVP normal.  No LE edema.  Radial pulses 2+ and symmetric. Respiratory: Normal respiratory rate and rhythm.  CTAB without rales or wheezes. Abdomen: Abdomen soft.  No TTP. No ascites, distension, hepatosplenomegaly.   MSK: No deformities or effusions.  No cyanosis or clubbing. Neuro: Cranial nerves normal.  Sensation intact to light touch. Speech is fluent.  Muscle strength normal.    Psych: Sensorium intact and responding to  questions, attention normal.  Behavior appropriate.  Affect tired, blunted.  Judgment and insight appear normal.     Labs on Admission:  I have personally reviewed following labs and imaging studies: CBC:  Recent Labs Lab 12/22/15 1050 12/23/15 0421 12/24/15 0537 12/26/15 2126  WBC 6.5 7.1 8.5 12.5*  NEUTROABS 3.7  --   --  8.7*  HGB 13.0 13.1 14.2 14.0  HCT 37.6* 39.9 42.8 43.1  MCV 80.2 81.9 82.1 83.9  PLT 240 234 252 216   Basic Metabolic Panel:  Recent Labs Lab 12/22/15 1050 12/23/15 0421 12/24/15 0537 12/26/15 2126  NA 136 136 135 136  K 3.4* 3.2* 3.1* 3.8  CL 101 102 104 104  CO2 25 26 21* 20*  GLUCOSE 303* 167* 193* 147*  BUN 9 8 <5* 19  CREATININE 0.83 0.85 0.80 1.68*  CALCIUM 9.2 8.9 9.3 9.6   GFR: Estimated Creatinine Clearance: 52.6 mL/min (by C-G formula based on SCr of 1.68 mg/dL (H)).  Liver Function Tests:  Recent Labs Lab 12/22/15 1050 12/23/15 0421 12/26/15 2126  AST 17 17 34  ALT 13* 14* 33  ALKPHOS 80 75 60  BILITOT 0.9 0.9 0.9  PROT 7.7 6.6 7.2  ALBUMIN 4.1 3.3* 3.8   No results for input(s): LIPASE, AMYLASE in the last 168 hours. No results for input(s): AMMONIA in the last 168 hours. Coagulation Profile:  Recent Labs Lab 12/22/15 1050 12/22/15 1804  INR 1.02 1.06   Cardiac Enzymes:  Recent Labs Lab 12/22/15 1050 12/22/15 1804 12/22/15 2309 12/23/15 0421  TROPONINI 0.40* 0.24* 0.24* 0.27*   BNP (last 3 results) No results for input(s): PROBNP in the last 8760 hours. HbA1C: No results for input(s): HGBA1C in the last 72 hours. CBG:  Recent Labs Lab 12/23/15 0811 12/23/15 1104 12/23/15 1631 12/23/15 2104 12/24/15 0608  GLUCAP 157* 239* 113* 264* 195*     Radiological Exams on Admission: Personally reviewed CXR shows no focal opacity or pneumonia: Dg Chest Port 1 View  Result Date: 12/26/2015 CLINICAL DATA:  55 year old male with near syncope. EXAM: PORTABLE CHEST 1 VIEW COMPARISON:  Chest radiograph  dated 12/22/2015 FINDINGS: The heart size and mediastinal contours are within normal limits. Both lungs are clear. The visualized skeletal structures are unremarkable. IMPRESSION: No active disease. Electronically Signed   By: Elgie Collard M.D.   On: 12/26/2015 22:22    EKG: Independently reviewed.  Rate 69, QTc 440, no ischemic changes.    Assessment/Plan 1. Hypotension:  Given AKI, suspect this is related to new antihypertensives, possibly with some element of dehydration/hypovolemia superimposed.  Sepsis is doubted.  Cardiogenic shock is doubted.   -Hold antihypertensives -Fluids overnight -Monitor in stepdown -Will obtain UA and cultures and lactate -Will obtain echocardiogram -Will trend troponin -If troponin abnormal or echocardiogram abnormal, will consult cardiology.    2. AKI:  Suspect this is pre-renal contraction versus some hypotension related injury.   -Check urine electrolytes -Check renal US -Strict I/Os -Repeat renal function tomorrow  3. CAD:  -Continue Plavix, aspirin -Continue statin -Hold BB, ACEi, CCB and HCTZ  4. HTN:  Hypotensive at admission. -Hold BB, ACEi, CCB and HCTZ  5. NIDDM:  -Hold 70/30 while in the hospital -Lantus 15 units daily -Sliding scale corrections -Hold metformin given AKI      DVT prophylaxis: Lovenox  Code Status: FULL  Family Communication: Wife at bedside.  Disposition Plan: Anticipate repeat BMP tomorrow and monitor BP in stepdown overnight. Consults called: Cardiology, not intending to see tomorrow unless needed Admission status: INPATIENT, stepdown       Medical decision making: Patient seen at 11:00 PM on 12/26/2015.  The patient was discussed with Dr. Ranae PalmsYelverton.  What exists of the patient's chart was reviewed in depth and summarized above.  Clinical condition: requiring close hemodynamic monitoring overnight.        Alberteen SamChristopher P Danford [facility] Pager [phone]

## 2015-12-26 NOTE — ED Triage Notes (Signed)
Pt to ED via [facility] after reported having a near syncopal episode while sitting on the couch at home.  Pt was released from hosp on 9/23 after having 2 cardiac stents place.  Pt st's also his blood pressure pills were changed while in hosp

## 2015-12-26 NOTE — ED Provider Notes (Signed)
[facility]-EMERGENCY DEPT Provider Note   CSN: [id number] Arrival date & time: 12/26/15  2051     History   Chief Complaint Chief Complaint  Patient presents with  . Near Syncope    HPI Antonio [name] is a 55 y.o. male.  HPI Patient recently discharged from the hospital after being diagnosed with an STEMI and having 2 stents placed. He was discharged home with Brilinta and aspirin. Blood pressure medication was also changed. Patient states he's been compliant with medicines. Developed acute onset lightheadedness, diaphoresis and near syncope this evening. Wife states the patient was very pale. She was unable to get a blood pressure at home. Patient denies chest pain, pressure or discomfort at any point. He currently denies any chest symptoms. Denies shortness of breath or new lower extremity swelling. Currently states he is asymptomatic. Past Medical History:  Diagnosis Date  . Diabetes (HCC)   . Diverticulosis   . GERD (gastroesophageal reflux disease)   . Heart attack [facility]) 2011   2 stents in [facility]  . Heart murmur   . Hemorrhoids   . Hyperlipidemia due to type 2 diabetes mellitus (HCC)   . Hypertension   . Stented coronary artery 2011    Patient Active Problem List   Diagnosis Date Noted  . Vasovagal near syncope   . CAD S/P percutaneous coronary angioplasty   . Hypotension 12/26/2015  . AKI (acute kidney injury) (HCC) 12/26/2015  . NSTEMI (non-ST elevated myocardial infarction) (HCC) 12/22/2015  . Essential hypertension   . Hyperlipidemia due to type 2 diabetes mellitus (HCC)   . Perineal abscess 10/27/2012  . Type 2 diabetes mellitus without complication, without long-term current use of insulin (HCC) 10/19/2012  . Acute lower GI bleeding 10/16/2012  . Diverticulosis 10/16/2012  . Acute blood loss anemia 10/16/2012  . Coronary artery disease due to lipid rich plaque 10/16/2012    Past Surgical History:  Procedure Laterality Date  . CARDIAC CATHETERIZATION N/A  12/23/2015   Procedure: Left Heart Cath and Coronary Angiography;  Surgeon: Peter M Swaziland, MD;  Location: [facility] INVASIVE CV LAB;  Service: Cardiovascular;  Laterality: N/A;  . CARDIAC CATHETERIZATION N/A 12/23/2015   Procedure: Coronary Stent Intervention;  Surgeon: Peter M Swaziland, MD;  Location: [facility] INVASIVE CV LAB;  Service: Cardiovascular;  Laterality: N/A;  . COLON RESECTION     12 inches taken out in 2011 at [facility] for diverticulosis  . COLONOSCOPY N/A 10/18/2012   Procedure: COLONOSCOPY;  Surgeon: Louis Meckel, MD;  Location: [facility] ENDOSCOPY;  Service: Endoscopy;  Laterality: N/A;  . COLONOSCOPY N/A 10/22/2012   Procedure: COLONOSCOPY;  Surgeon: Iva Boop, MD;  Location: [facility] ENDOSCOPY;  Service: Endoscopy;  Laterality: N/A;  . CORONARY ANGIOPLASTY WITH STENT PLACEMENT  2011   at [facility], 2 stents, locations unknown  . ESOPHAGOGASTRODUODENOSCOPY N/A 10/18/2012   Procedure: ESOPHAGOGASTRODUODENOSCOPY (EGD);  Surgeon: Louis Meckel, MD;  Location: [facility] ENDOSCOPY;  Service: Endoscopy;  Laterality: N/A;  . HERNIA REPAIR    . IRRIGATION AND DEBRIDEMENT ABSCESS N/A 10/26/2012   Procedure: IRRIGATION AND DEBRIDEMENT PERINEAL ABSCESS;  Surgeon: Wilmon Arms. Corliss Skains, MD;  Location: [facility] OR;  Service: General;  Laterality: N/A;       Home Medications    Prior to Admission medications   Medication Sig Start Date End Date Taking? Authorizing Provider  amLODipine (NORVASC) 5 MG tablet Take 1 tablet (5 mg total) by mouth daily. 12/25/15  Yes Little Ishikawa, NP  aspirin EC 81 MG  tablet Take 1 tablet (81 mg total) by mouth daily. 12/24/15  Yes Little IshikawaErin E Novosad, NP  atorvastatin (LIPITOR) 80 MG tablet Take 1 tablet (80 mg total) by mouth daily at 6 PM. 12/24/15  Yes Ellsworth LennoxBrittany M Strader, PA  carvedilol (COREG) 6.25 MG tablet Take 1 tablet (6.25 mg total) by mouth 2 (two) times daily with a meal. 12/24/15  Yes Ellsworth LennoxBrittany M Strader, PA  clopidogrel (PLAVIX) 75 MG tablet Take 1 tablet (75 mg total) by  mouth daily. 12/25/15  Yes Ellsworth LennoxBrittany M Strader, PA  docusate sodium 100 MG CAPS Take 100 mg by mouth 2 (two) times daily as needed for constipation. Patient taking differently: Take 100 mg by mouth daily as needed (for constipation).  10/30/12  Yes Lorretta HarpXilin Niu, MD  hydrochlorothiazide (HYDRODIURIL) 25 MG tablet Take 1 tablet (25 mg total) by mouth daily. 12/25/15  Yes Ellsworth LennoxBrittany M Strader, PA  insulin aspart protamine - aspart (NOVOLOG MIX 70/30 FLEXPEN) (70-30) 100 UNIT/ML FlexPen Inject 30 Units into the skin 2 (two) times daily before a meal.   Yes Historical Provider, MD  lisinopril (PRINIVIL,ZESTRIL) 40 MG tablet Take 1 tablet (40 mg total) by mouth daily. 12/24/15  Yes Little IshikawaErin E Maske, NP  metFORMIN (GLUCOPHAGE-XR) 500 MG 24 hr tablet Take 1,000 mg by mouth 2 (two) times daily.   Yes Historical Provider, MD  nitroGLYCERIN (NITROSTAT) 0.4 MG SL tablet Place 0.4 mg under the tongue every 5 (five) minutes as needed for chest pain.   Yes Historical Provider, MD  pantoprazole (PROTONIX) 40 MG tablet Take 40 mg by mouth daily as needed. For heartburn   Yes Historical Provider, MD  potassium chloride SA (K-DUR,KLOR-CON) 20 MEQ tablet Take 1 tablet (20 mEq total) by mouth daily. 12/25/15  Yes Ellsworth LennoxBrittany M Strader, PA    Family History Family History  Problem Relation Age of Onset  . Alzheimer's disease Mother 8176  . Heart attack Father 5535  . CAD Brother 40    triple bypass    Social History Social History  Substance Use Topics  . Smoking status: Former Smoker    Types: Cigarettes    Quit date: 10/19/2002  . Smokeless tobacco: Never Used  . Alcohol use Yes     Comment: socially, one-2 drinks/month     Allergies   Review of patient's allergies indicates no known allergies.   Review of Systems Review of Systems  Constitutional: Positive for diaphoresis. Negative for fever.  Respiratory: Negative for shortness of breath.   Cardiovascular: Negative for chest pain, palpitations and leg swelling.    Gastrointestinal: Negative for abdominal pain, diarrhea, nausea and vomiting.  Musculoskeletal: Negative for back pain and neck pain.  Skin: Negative for rash and wound.  Neurological: Positive for dizziness and light-headedness. Negative for weakness, numbness and headaches.  All other systems reviewed and are negative.    Physical Exam Updated Vital Signs BP 129/69 (BP Location: Right Arm)   Pulse 80   Temp 98.4 F (36.9 C) (Oral)   Resp 18   Ht 5\' 7"  (1.702 m)   Wt 194 lb (88 kg)   SpO2 100%   BMI 30.38 kg/m   Physical Exam  Constitutional: He is oriented to person, place, and time. He appears well-developed and well-nourished. No distress.  HENT:  Head: Normocephalic and atraumatic.  Mouth/Throat: Oropharynx is clear and moist.  Eyes: EOM are normal. Pupils are equal, round, and reactive to light.  Neck: Normal range of motion. Neck supple. No JVD present.  Cardiovascular:  Normal rate and regular rhythm.  Exam reveals no gallop and no friction rub.   No murmur heard. Pulmonary/Chest: Effort normal and breath sounds normal. No respiratory distress. He has no wheezes. He has no rales. He exhibits no tenderness.  Abdominal: Soft. Bowel sounds are normal. There is no tenderness. There is no rebound and no guarding.  Musculoskeletal: Normal range of motion. He exhibits no edema or tenderness.  No lower extremity swelling, asymmetry or tenderness. Distal pulses are intact.  Neurological: He is alert and oriented to person, place, and time.  Patient is alert and oriented x3 with clear, goal oriented speech. Patient has 5/5 motor in all extremities. Sensation is intact to light touch.   Skin: Skin is warm and dry. Capillary refill takes less than 2 seconds. No rash noted. No erythema.  Psychiatric: He has a normal mood and affect. His behavior is normal.  Nursing note and vitals reviewed.    ED Treatments / Results  Labs (all labs ordered are listed, but only abnormal  results are displayed) Labs Reviewed  CBC WITH DIFFERENTIAL/PLATELET - Abnormal; Notable for the following:       Result Value   WBC 12.5 (*)    RDW 15.6 (*)    Neutro Abs 8.7 (*)    All other components within normal limits  COMPREHENSIVE METABOLIC PANEL - Abnormal; Notable for the following:    CO2 20 (*)    Glucose, Bld 147 (*)    Creatinine, Ser 1.68 (*)    GFR calc non Af Amer 44 (*)    GFR calc Af Amer 51 (*)    All other components within normal limits  URINALYSIS, ROUTINE W REFLEX MICROSCOPIC (NOT AT [facility]) - Abnormal; Notable for the following:    Glucose, UA 100 (*)    All other components within normal limits  BASIC METABOLIC PANEL - Abnormal; Notable for the following:    Glucose, Bld 202 (*)    Creatinine, Ser 1.30 (*)    All other components within normal limits  CBC - Abnormal; Notable for the following:    Hemoglobin 11.9 (*)    HCT 37.2 (*)    RDW 15.6 (*)    All other components within normal limits  TROPONIN I - Abnormal; Notable for the following:    Troponin I 0.11 (*)    All other components within normal limits  TROPONIN I - Abnormal; Notable for the following:    Troponin I 0.11 (*)    All other components within normal limits  GLUCOSE, CAPILLARY - Abnormal; Notable for the following:    Glucose-Capillary 200 (*)    All other components within normal limits  GLUCOSE, CAPILLARY - Abnormal; Notable for the following:    Glucose-Capillary 122 (*)    All other components within normal limits  GLUCOSE, CAPILLARY - Abnormal; Notable for the following:    Glucose-Capillary 214 (*)    All other components within normal limits  GLUCOSE, CAPILLARY - Abnormal; Notable for the following:    Glucose-Capillary 195 (*)    All other components within normal limits  GLUCOSE, CAPILLARY - Abnormal; Notable for the following:    Glucose-Capillary 169 (*)    All other components within normal limits  I-STAT CG4 LACTIC ACID, ED - Abnormal; Notable for the following:     Lactic Acid, Venous 2.31 (*)    All other components within normal limits  MRSA PCR SCREENING  CULTURE, BLOOD (ROUTINE X 2)  CULTURE, BLOOD (ROUTINE X 2)  SODIUM, URINE, RANDOM  CREATININE, URINE, RANDOM  LACTIC ACID, PLASMA  LACTIC ACID, PLASMA  BASIC METABOLIC PANEL  CBC  I-STAT TROPOININ, ED    EKG  EKG Interpretation  Date/Time:  Monday December 26 2015 20:54:48 EDT Ventricular Rate:  69 PR Interval:    QRS Duration: 97 QT Interval:  410 QTC Calculation: 440 R Axis:   46 Text Interpretation:  Sinus rhythm Probable left atrial enlargement Abnormal R-wave progression, late transition Abnormal T, consider ischemia, diffuse leads Borderline ST elevation, anterior leads Confirmed by Ranae Palms  MD, Revecca Nachtigal ([id number]) on 12/26/2015 8:58:56 PM       Radiology US Renal  Result Date: 12/27/2015 CLINICAL DATA:  Acute renal insufficiency. History of diabetes and hypertension EXAM: RENAL / URINARY TRACT ULTRASOUND COMPLETE COMPARISON:  Report of a renal ultrasound of September 30, 2010 FINDINGS: Right Kidney: Length: 11.6 cm. The renal cortical echotexture remains lower than that of the adjacent liver. There is no hydronephrosis. In the lower pole there is a non obstructing shadowing echogenic focus consistent with a 4.5 mm stone. Left Kidney: Length: 13.1 cm. The renal cortical echotexture is normal. No stones or hydronephrosis are observed. Bladder: The partially distended urinary bladder exhibits subjective wall thickening to just over 1 cm. IMPRESSION: 1. No hydronephrosis. Probable nonobstructing 4.5 mm lower pole stone on the right. 2. Normal renal cortical echotexture bilaterally. 3. Partially distended urinary bladder. The bladder wall is subjectively thickened. Electronically Signed   By: Kennede Lusk  [place] M.D.   On: 12/27/2015 09:09   Dg Chest Port 1 View  Result Date: 12/26/2015 CLINICAL DATA:  55 year old male with near syncope. EXAM: PORTABLE CHEST 1 VIEW COMPARISON:  Chest radiograph  dated 12/22/2015 FINDINGS: The heart size and mediastinal contours are within normal limits. Both lungs are clear. The visualized skeletal structures are unremarkable. IMPRESSION: No active disease. Electronically Signed   By: Elgie Collard M.D.   On: 12/26/2015 22:22    Procedures Procedures (including critical care time)  Medications Ordered in ED Medications  aspirin EC tablet 81 mg (81 mg Oral Given 12/27/15 0913)  atorvastatin (LIPITOR) tablet 80 mg (80 mg Oral Given 12/27/15 1725)  clopidogrel (PLAVIX) tablet 75 mg (75 mg Oral Given 12/27/15 0913)  insulin aspart (novoLOG) injection 0-9 Units (2 Units Subcutaneous Given 12/27/15 1725)  insulin aspart (novoLOG) injection 0-5 Units (0 Units Subcutaneous Not Given 12/27/15 2200)  enoxaparin (LOVENOX) injection 40 mg (40 mg Subcutaneous Given 12/27/15 2217)  sodium chloride flush (NS) 0.9 % injection 3 mL (3 mLs Intravenous Not Given 12/27/15 2200)  acetaminophen (TYLENOL) tablet 650 mg (not administered)    Or  acetaminophen (TYLENOL) suppository 650 mg (not administered)  ondansetron (ZOFRAN) tablet 4 mg (not administered)    Or  ondansetron (ZOFRAN) injection 4 mg (not administered)  insulin glargine (LANTUS) injection 15 Units (15 Units Subcutaneous Given 12/27/15 2218)  0.9 %  sodium chloride infusion ( Intravenous Paused 12/27/15 1443)  carvedilol (COREG) tablet 3.125 mg (3.125 mg Oral Given 12/27/15 1725)  nitroGLYCERIN (NITROSTAT) SL tablet 0.4 mg (not administered)  pantoprazole (PROTONIX) EC tablet 40 mg (40 mg Oral Given 12/27/15 1725)  sodium chloride 0.9 % bolus 500 mL (0 mLs Intravenous Stopped 12/26/15 2353)  sodium chloride 0.9 % bolus 500 mL (500 mLs Intravenous New Bag/Given 12/26/15 2354)  sodium chloride 0.9 % bolus 1,000 mL (1,000 mLs Intravenous New Bag/Given 12/27/15 0035)     Initial Impression / Assessment and Plan / ED Course  I have reviewed the triage vital  signs and the nursing notes.  Pertinent labs & imaging  results that were available during my care of the patient were reviewed by me and considered in my medical decision making (see chart for details).  Clinical Course    Reviewed patient's EKG with Dr. Shirlee Latch. Similar morphology on previous EKGs. Given the patient is asymptomatic, suggested troponin and delta troponin to rule out acute injury. Also suggested stopping all of the patient's blood pressure medications other than the carvedilol. We'll continue to closely monitor in the emergency department.  Final Clinical Impressions(s) / ED Diagnoses   Final diagnoses:  Near syncope  Hypotension, unspecified hypotension type  AKI (acute kidney injury) (HCC)  Patient still with borderline hypotension despite IV fluids. Normal troponin. Creatinine is doubled over the last few days. Discussed with Dr. Shirlee Latch. Agrees with bringing the patient into the hospital for observation and rehydration. Asked to have medicine admit.  New Prescriptions Current Discharge Medication List       Loren Racer, MD 12/27/15 2337

## 2015-12-27 ENCOUNTER — Inpatient Hospital Stay (HOSPITAL_COMMUNITY): Payer: Non-veteran care

## 2015-12-27 DIAGNOSIS — I2510 Atherosclerotic heart disease of native coronary artery without angina pectoris: Secondary | ICD-10-CM

## 2015-12-27 DIAGNOSIS — R55 Syncope and collapse: Secondary | ICD-10-CM | POA: Diagnosis present

## 2015-12-27 DIAGNOSIS — Z9861 Coronary angioplasty status: Secondary | ICD-10-CM

## 2015-12-27 LAB — CBC
HCT: 37.2 % — ABNORMAL LOW (ref 39.0–52.0)
Hemoglobin: 11.9 g/dL — ABNORMAL LOW (ref 13.0–17.0)
MCH: 27 pg (ref 26.0–34.0)
MCHC: 32 g/dL (ref 30.0–36.0)
MCV: 84.5 fL (ref 78.0–100.0)
PLATELETS: 208 10*3/uL (ref 150–400)
RBC: 4.4 MIL/uL (ref 4.22–5.81)
RDW: 15.6 % — AB (ref 11.5–15.5)
WBC: 9.6 10*3/uL (ref 4.0–10.5)

## 2015-12-27 LAB — BASIC METABOLIC PANEL
Anion gap: 6 (ref 5–15)
BUN: 19 mg/dL (ref 6–20)
CALCIUM: 9.1 mg/dL (ref 8.9–10.3)
CO2: 25 mmol/L (ref 22–32)
CREATININE: 1.3 mg/dL — AB (ref 0.61–1.24)
Chloride: 107 mmol/L (ref 101–111)
GFR calc non Af Amer: >60 mL/min (ref >60)
GLUCOSE: 202 mg/dL — AB (ref 65–99)
Potassium: 4.1 mmol/L (ref 3.5–5.1)
Sodium: 138 mmol/L (ref 135–145)

## 2015-12-27 LAB — GLUCOSE, CAPILLARY
GLUCOSE-CAPILLARY: 195 mg/dL — AB (ref 65–99)
GLUCOSE-CAPILLARY: 169 mg/dL — AB (ref 65–99)
Glucose-Capillary: 200 mg/dL — ABNORMAL HIGH (ref 65–99)
Glucose-Capillary: 122 mg/dL — ABNORMAL HIGH (ref 65–99)
Glucose-Capillary: 214 mg/dL — ABNORMAL HIGH (ref 65–99)

## 2015-12-27 LAB — URINALYSIS, ROUTINE W REFLEX MICROSCOPIC
Bilirubin Urine: NEGATIVE
GLUCOSE, UA: 100 mg/dL — AB
Hgb urine dipstick: NEGATIVE
KETONES UR: NEGATIVE mg/dL
LEUKOCYTES UA: NEGATIVE
NITRITE: NEGATIVE
PROTEIN: NEGATIVE mg/dL
Specific Gravity, Urine: 1.02 (ref 1.005–1.030)
pH: 5.5 (ref 5.0–8.0)

## 2015-12-27 LAB — LACTIC ACID, PLASMA
LACTIC ACID, VENOUS: 1.9 mmol/L (ref 0.5–1.9)
Lactic Acid, Venous: 1.1 mmol/L (ref 0.5–1.9)

## 2015-12-27 LAB — I-STAT CG4 LACTIC ACID, ED: LACTIC ACID, VENOUS: 2.31 mmol/L — AB (ref 0.5–1.9)

## 2015-12-27 LAB — MRSA PCR SCREENING: MRSA by PCR: NEGATIVE

## 2015-12-27 LAB — TROPONIN I
TROPONIN I: 0.11 ng/mL — AB (ref <0.03)
Troponin I: 0.11 ng/mL (ref <0.03)

## 2015-12-27 LAB — CREATININE, URINE, RANDOM: CREATININE, URINE: 163.29 mg/dL

## 2015-12-27 LAB — SODIUM, URINE, RANDOM: SODIUM UR: 146 mmol/L

## 2015-12-27 MED ORDER — CLOPIDOGREL BISULFATE 75 MG PO TABS
75.0000 mg | ORAL_TABLET | Freq: Every day | ORAL | Status: DC
  Administered 2015-12-27 – 2015-12-28 (×2): 75 mg via ORAL
  Filled 2015-12-27 (×2): qty 1

## 2015-12-27 MED ORDER — INSULIN GLARGINE 100 UNIT/ML ~~LOC~~ SOLN
15.0000 [IU] | Freq: Every day | SUBCUTANEOUS | Status: DC
  Administered 2015-12-27 (×2): 15 [IU] via SUBCUTANEOUS
  Filled 2015-12-27 (×3): qty 0.15

## 2015-12-27 MED ORDER — CARVEDILOL 3.125 MG PO TABS
3.1250 mg | ORAL_TABLET | Freq: Two times a day (BID) | ORAL | Status: DC
  Administered 2015-12-27 – 2015-12-28 (×2): 3.125 mg via ORAL
  Filled 2015-12-27 (×2): qty 1

## 2015-12-27 MED ORDER — INSULIN ASPART 100 UNIT/ML ~~LOC~~ SOLN: 0.0000 [IU] | Freq: Every day | SUBCUTANEOUS | Status: DC

## 2015-12-27 MED ORDER — ACETAMINOPHEN 650 MG RE SUPP: 650.0000 mg | Freq: Four times a day (QID) | RECTAL | Status: DC | PRN

## 2015-12-27 MED ORDER — ENOXAPARIN SODIUM 40 MG/0.4ML ~~LOC~~ SOLN
40.0000 mg | Freq: Every day | SUBCUTANEOUS | Status: DC
  Administered 2015-12-27 (×2): 40 mg via SUBCUTANEOUS
  Filled 2015-12-27 (×2): qty 0.4

## 2015-12-27 MED ORDER — SODIUM CHLORIDE 0.9 % IV SOLN: INTRAVENOUS | Status: AC

## 2015-12-27 MED ORDER — SODIUM CHLORIDE 0.9 % IV SOLN
INTRAVENOUS | Status: DC
  Administered 2015-12-27: 01:00:00 via INTRAVENOUS

## 2015-12-27 MED ORDER — INSULIN ASPART 100 UNIT/ML ~~LOC~~ SOLN
0.0000 [IU] | Freq: Three times a day (TID) | SUBCUTANEOUS | Status: DC
  Administered 2015-12-27: 1 [IU] via SUBCUTANEOUS
  Administered 2015-12-27: 2 [IU] via SUBCUTANEOUS
  Administered 2015-12-28: 5 [IU] via SUBCUTANEOUS

## 2015-12-27 MED ORDER — PANTOPRAZOLE SODIUM 40 MG PO TBEC
40.0000 mg | DELAYED_RELEASE_TABLET | Freq: Every day | ORAL | Status: DC
  Administered 2015-12-27 – 2015-12-28 (×2): 40 mg via ORAL
  Filled 2015-12-27 (×2): qty 1

## 2015-12-27 MED ORDER — NITROGLYCERIN 0.4 MG SL SUBL: 0.4000 mg | SUBLINGUAL_TABLET | SUBLINGUAL | Status: DC | PRN

## 2015-12-27 MED ORDER — ACETAMINOPHEN 325 MG PO TABS: 650.0000 mg | ORAL_TABLET | Freq: Four times a day (QID) | ORAL | Status: DC | PRN

## 2015-12-27 MED ORDER — ASPIRIN EC 81 MG PO TBEC
81.0000 mg | DELAYED_RELEASE_TABLET | Freq: Every day | ORAL | Status: DC
Start: 2015-12-27 — End: 2015-12-28
  Administered 2015-12-27 – 2015-12-28 (×2): 81 mg via ORAL
  Filled 2015-12-27 (×2): qty 1

## 2015-12-27 MED ORDER — ONDANSETRON HCL 4 MG/2ML IJ SOLN: 4.0000 mg | Freq: Four times a day (QID) | INTRAMUSCULAR | Status: DC | PRN

## 2015-12-27 MED ORDER — ONDANSETRON HCL 4 MG PO TABS: 4.0000 mg | ORAL_TABLET | Freq: Four times a day (QID) | ORAL | Status: DC | PRN

## 2015-12-27 MED ORDER — SODIUM CHLORIDE 0.9% FLUSH
3.0000 mL | Freq: Two times a day (BID) | INTRAVENOUS | Status: DC
  Administered 2015-12-27 – 2015-12-28 (×3): 3 mL via INTRAVENOUS

## 2015-12-27 MED ORDER — ATORVASTATIN CALCIUM 80 MG PO TABS
80.0000 mg | ORAL_TABLET | Freq: Every day | ORAL | Status: DC
  Administered 2015-12-27: 80 mg via ORAL
  Filled 2015-12-27: qty 1

## 2015-12-27 NOTE — Consult Note (Signed)
CARDIOLOGY CONSULT NOTE   Patient ID: Antonio [name] MRN: [id number] DOB/AGE: 1960-05-18 55 y.o.  Admit date: 12/26/2015  Primary Physician   Pcp Not In System Primary Cardiologist   Dr. Anne Fu Reason for Consultation   Hypotension Requesting Physician  Dr. Thedore Mins HPI: Antonio [name] is a 55 y.o. male with a history of hypertension, hyperlipidemia, diabetes, CAD and remote tobacco abuse and recent admission for non-STEMI who presented to ER by EMS for episode of near syncope.  The patient was admitted in 9/21-9/23 for non-STEMI. Cath showed patent stent in first diagonal, first OM and distal RCA. However has a single-vessel disease with 80% stenosis proximal RCA and 95% mid RCA status post DES placement x 2 to RCA. Post cath patient had a shortness of breath on Brillinta--> discontinued and loaded with Plavix. Prior to admission he was on Tenoretic, this was changed to Coreg and HCTZ. Amlodipine added due to elevated blood pressure. Continued home lisinopril. He was discharged stably on 12/24/15.   Since discharge he had made significant dietary changes. After dinner last night patient had 2 lose bowel and then had an episode of 'weakness' and bilateral shoulder tightness while laying on couch. He went to lay on bed when he suddenly become severe diaphoretic and pale and EMS was called. Per wife he almost passed out. EMS found him hypotensive with normal blood glucose and brought to ER further evaluation.  Upon arrival to ER his blood pressure was 85/60. Serum creatinine of 1.68. Point-of-care troponin negative. WBC 12.5. Elevated lactic acid. He was given total of 2 L of normal saline bolus. Now his serum creatinine increased to 1.3. WBC 9.6. Normal lactic acid. Troponin I 0.11-->0.11 (last admission troponin was 0.27 on 12/23/15.)  EKG shows sinus rhythm with a rate of 69 beats per minute and T-wave inversion in inferior lateral lead. Compared to EKG of 12/24/15 (discharge date) - there is new T-wave  inversion in inferior lead and lead I, aVL and more prominent TWI in lead V4 to V6.   The patient has ambulated this morning without any chest pain or shortness of breath. This episode is different from his non-STEMI admission. Blood pressure improved with holding all antihypertensive.   Past Medical History:  Diagnosis Date  . Diabetes (HCC)   . Diverticulosis   . GERD (gastroesophageal reflux disease)   . Heart attack [facility]) 2011   2 stents in [facility]  . Heart murmur   . Hemorrhoids   . Hyperlipidemia due to type 2 diabetes mellitus (HCC)   . Hypertension   . Stented coronary artery 2011     Past Surgical History:  Procedure Laterality Date  . CARDIAC CATHETERIZATION N/A 12/23/2015   Procedure: Left Heart Cath and Coronary Angiography;  Surgeon: Peter M Swaziland, MD;  Location: [facility] INVASIVE CV LAB;  Service: Cardiovascular;  Laterality: N/A;  . CARDIAC CATHETERIZATION N/A 12/23/2015   Procedure: Coronary Stent Intervention;  Surgeon: Peter M Swaziland, MD;  Location: [facility] INVASIVE CV LAB;  Service: Cardiovascular;  Laterality: N/A;  . COLON RESECTION     12 inches taken out in 2011 at [facility] for diverticulosis  . COLONOSCOPY N/A 10/18/2012   Procedure: COLONOSCOPY;  Surgeon: Louis Meckel, MD;  Location: [facility] ENDOSCOPY;  Service: Endoscopy;  Laterality: N/A;  . COLONOSCOPY N/A 10/22/2012   Procedure: COLONOSCOPY;  Surgeon: Iva Boop, MD;  Location: [facility] ENDOSCOPY;  Service: Endoscopy;  Laterality: N/A;  . CORONARY ANGIOPLASTY WITH STENT PLACEMENT  2011  at [facility], 2 stents, locations unknown  . ESOPHAGOGASTRODUODENOSCOPY N/A 10/18/2012   Procedure: ESOPHAGOGASTRODUODENOSCOPY (EGD);  Surgeon: Louis Meckelobert D Kaplan, MD;  [facility];  Service: Endoscopy;  Laterality: N/A;  . HERNIA REPAIR    . IRRIGATION AND DEBRIDEMENT ABSCESS N/A 10/26/2012   Procedure: IRRIGATION AND DEBRIDEMENT PERINEAL ABSCESS;  Surgeon: Wilmon ArmsMatthew K. Corliss Skainssuei, MD;  Location: [facility] OR;   Service: General;  Laterality: N/A;    No Known Allergies  I have reviewed the patient's current medications . aspirin  324 mg Oral Once  . aspirin EC  81 mg Oral Daily  . atorvastatin  80 mg Oral q1800  . clopidogrel  75 mg Oral Daily  . enoxaparin (LOVENOX) injection  40 mg Subcutaneous QHS  . insulin aspart  0-5 Units Subcutaneous QHS  . insulin aspart  0-9 Units Subcutaneous TID WC  . insulin glargine  15 Units Subcutaneous QHS  . sodium chloride flush  3 mL Intravenous Q12H   . sodium chloride 75 mL/hr at 12/27/15 0913   acetaminophen **OR** acetaminophen, ondansetron **OR** ondansetron (ZOFRAN) IV  Prior to Admission medications   Medication Sig Start Date End Date Taking? Authorizing Provider  amLODipine (NORVASC) 5 MG tablet Take 1 tablet (5 mg total) by mouth daily. 12/25/15  Yes Little IshikawaErin E Petko, NP  aspirin EC 81 MG tablet Take 1 tablet (81 mg total) by mouth daily. 12/24/15  Yes Little IshikawaErin E Kasperski, NP  atorvastatin (LIPITOR) 80 MG tablet Take 1 tablet (80 mg total) by mouth daily at 6 PM. 12/24/15  Yes Ellsworth LennoxBrittany M Strader, PA  carvedilol (COREG) 6.25 MG tablet Take 1 tablet (6.25 mg total) by mouth 2 (two) times daily with a meal. 12/24/15  Yes Ellsworth LennoxBrittany M Strader, PA  clopidogrel (PLAVIX) 75 MG tablet Take 1 tablet (75 mg total) by mouth daily. 12/25/15  Yes Ellsworth LennoxBrittany M Strader, PA  docusate sodium 100 MG CAPS Take 100 mg by mouth 2 (two) times daily as needed for constipation. Patient taking differently: Take 100 mg by mouth daily as needed (for constipation).  10/30/12  Yes Lorretta HarpXilin Niu, MD  hydrochlorothiazide (HYDRODIURIL) 25 MG tablet Take 1 tablet (25 mg total) by mouth daily. 12/25/15  Yes Ellsworth LennoxBrittany M Strader, PA  insulin aspart protamine - aspart (NOVOLOG MIX 70/30 FLEXPEN) (70-30) 100 UNIT/ML FlexPen Inject 30 Units into the skin 2 (two) times daily before a meal.   Yes Historical Provider, MD  lisinopril (PRINIVIL,ZESTRIL) 40 MG tablet Take 1 tablet (40 mg total) by mouth daily. 12/24/15   Yes Little IshikawaErin E Eldredge, NP  metFORMIN (GLUCOPHAGE-XR) 500 MG 24 hr tablet Take 1,000 mg by mouth 2 (two) times daily.   Yes Historical Provider, MD  nitroGLYCERIN (NITROSTAT) 0.4 MG SL tablet Place 0.4 mg under the tongue every 5 (five) minutes as needed for chest pain.   Yes Historical Provider, MD  pantoprazole (PROTONIX) 40 MG tablet Take 40 mg by mouth daily as needed. For heartburn   Yes Historical Provider, MD  potassium chloride SA (K-DUR,KLOR-CON) 20 MEQ tablet Take 1 tablet (20 mEq total) by mouth daily. 12/25/15  Yes Ellsworth LennoxBrittany M Strader, PA     Social History   Social History  . Marital status: Married    Spouse name: N/A  . Number of children: N/A  . Years of education: N/A   Occupational History  . Retired [employer]    Social History Main Topics  . Smoking status: Former Smoker    Types: Cigarettes    Quit date:  10/19/2002  . Smokeless tobacco: Never Used  . Alcohol use Yes     Comment: socially, one-2 drinks/month  . Drug use: No  . Sexual activity: Not on file   Other Topics Concern  . Not on file   Social History Narrative   Lives with wife in [place].    Family Status  Relation Status  . Mother Deceased  . Father Deceased  . Brother Alive   Family History  Problem Relation Age of Onset  . Alzheimer's disease Mother 65  . Heart attack Father 66  . CAD Brother 40    triple bypass     ROS:  Full 14 point review of systems complete and found to be negative unless listed above.  Physical Exam: Blood pressure 127/82, pulse 82, temperature 98.3 F (36.8 C), temperature source Oral, resp. rate 18, height 5\' 7"  (1.702 m), weight 194 lb (88 kg), SpO2 100 %.  General: Well developed, well nourished, male in no acute distress Head: Eyes PERRLA, No xanthomas. Normocephalic and atraumatic, oropharynx without edema or exudate.  Lungs: Resp regular and unlabored, CTA. Heart: RRR no s3, s4, or murmurs..   Neck: No carotid bruits. No lymphadenopathy.  No  JVD. Abdomen:  Bowel sounds present, abdomen soft and non-tender without masses or hernias noted. Msk:  No spine or cva tenderness. No weakness, no joint deformities or effusions. Extremities: No clubbing, cyanosis or edema. DP/PT/Radials 2+ and equal bilaterally. Right radial cath site without hematoma or bruit. Neuro: Alert and oriented X 3. No focal deficits noted. Psych:  Good affect, responds appropriately Skin: No rashes or lesions noted.  Labs:   Lab Results  Component Value Date   WBC 9.6 12/27/2015   HGB 11.9 (L) 12/27/2015   HCT 37.2 (L) 12/27/2015   MCV 84.5 12/27/2015   PLT 208 12/27/2015   No results for input(s): INR in the last 72 hours.  Recent Labs Lab 12/26/15 2126 12/27/15 0147  NA 136 138  K 3.8 4.1  CL 104 107  CO2 20* 25  BUN 19 19  CREATININE 1.68* 1.30*  CALCIUM 9.6 9.1  PROT 7.2  --   BILITOT 0.9  --   ALKPHOS 60  --   ALT 33  --   AST 34  --   GLUCOSE 147* 202*  ALBUMIN 3.8  --    No results found for: MG  Recent Labs  12/27/15 0147 12/27/15 0729  TROPONINI 0.11* 0.11*    Recent Labs  12/26/15 2126  TROPIPOC 0.08   No results found for: PROBNP Lab Results  Component Value Date   CHOL 188 12/23/2015   HDL 39 (L) 12/23/2015   LDLCALC 121 (H) 12/23/2015   TRIG 138 12/23/2015   No results found for: DDIMER No results found for: LIPASE, AMYLASE No results found for: TSH, T4TOTAL, T3FREE, THYROIDAB Retic Ct Pct  Date/Time Value Ref Range Status  10/23/2012 02:22 PM 6.4 (H) 0.4 - 3.1 % Final    Coronary Stent Intervention  12/23/15  Left Heart Cath and Coronary Angiography    Ost LM lesion, 20 %stenosed.  Ost LAD lesion, 40 %stenosed.  Stent to the first diagonal, 0 %stenosed.  STent to 1st Mrg lesion, 0 %stenosed.  Stent to Dist RCA lesion, 0 %stenosed.  The left ventricular systolic function is normal.  LV end diastolic pressure is normal.  The left ventricular ejection fraction is 55-65% by visual estimate.  A STENT PROMUS  PREM MR 3.0X16 drug eluting stent  was successfully placed.  Mid RCA lesion, 95 %stenosed.  Post intervention, there is a 0% residual stenosis.  A STENT PROMUS PREM MR 3.5X12 drug eluting stent was successfully placed.  Ost RCA to Prox RCA lesion, 80 %stenosed.  Post intervention, there is a 0% residual stenosis.  1. Single vessel obstructive CAD- 80% proximal RCA, 95% mid RCA 2. Patent old stents in the first diagonal, first OM, and distal RCA 3. Normal LV function with normal LVEDP 4. Successful stenting of the mid RCA with a DES 5. Successful stenting of the proximal RCA with DES  Plan: DAPT. ASA Only 81 mg daily and Brilinta 90 mg bid. Patient may be a candidate for the TWILIGHT trial. Anticipate DC in am.      Radiology:  US Renal  Result Date: 12/27/2015 CLINICAL DATA:  Acute renal insufficiency. History of diabetes and hypertension EXAM: RENAL / URINARY TRACT ULTRASOUND COMPLETE COMPARISON:  Report of a renal ultrasound of September 30, 2010 FINDINGS: Right Kidney: Length: 11.6 cm. The renal cortical echotexture remains lower than that of the adjacent liver. There is no hydronephrosis. In the lower pole there is a non obstructing shadowing echogenic focus consistent with a 4.5 mm stone. Left Kidney: Length: 13.1 cm. The renal cortical echotexture is normal. No stones or hydronephrosis are observed. Bladder: The partially distended urinary bladder exhibits subjective wall thickening to just over 1 cm. IMPRESSION: 1. No hydronephrosis. Probable nonobstructing 4.5 mm lower pole stone on the right. 2. Normal renal cortical echotexture bilaterally. 3. Partially distended urinary bladder. The bladder wall is subjectively thickened. Electronically Signed   By: David  Swaziland M.D.   On: 12/27/2015 09:09   Dg Chest Port 1 View  Result Date: 12/26/2015 CLINICAL DATA:  55 year old male with near syncope. EXAM: PORTABLE CHEST 1 VIEW COMPARISON:  Chest radiograph dated 12/22/2015 FINDINGS: The  heart size and mediastinal contours are within normal limits. Both lungs are clear. The visualized skeletal structures are unremarkable. IMPRESSION: No active disease. Electronically Signed   By: Elgie Collard M.D.   On: 12/26/2015 22:22    ASSESSMENT AND PLAN:     1. Hypotension - Likely due to combination of dehydration, history of present illness and antihypertensive with recent addition of diuretics. Could be vasovagal given loose stool prior to episode. Given 2 L of normal saline. Now blood pressure has been normalized. Currently holding Coreg, lisinopril, HCTZ and amlodipine.   2. CAD - Recent DES x 2 to RCA. Patient stent in irst diagonal, first OM and distal RCA.   3. Bilateral shoulder tightness - This is new. During presentation with non-STEMI his symptoms was chest tightness. Now resolved. Ambulated this morning without any dyspnea or chest pain. Troponin 0.11 x 2. Seems  trending down from discharge and admission. EKG with new T-wave changes. ? Transient ischemia in setting of hypotension. Will get repeat EKG. Get echocardiogram. EF was normal by cath.   4. AKI (acute kidney injury) (HCC) - Improving with hydration.   SignedManson Passey, PA 12/27/2015, 1:33 PM Pager [phone]  Co-Sign MD

## 2015-12-27 NOTE — Progress Notes (Addendum)
PROGRESS NOTE                                                                                                                                                                                                             Patient Demographics:    Traiton [name], is a 55 y.o. male, DOB - [birth year], JOI:[id number]  Admit date - 12/26/2015   Admitting Physician Alberteen Sam, MD  Outpatient Primary MD for the patient is Pcp Not In System  LOS - 1  Chief Complaint  Patient presents with  . Near Syncope       Brief Narrative   Antonio [name] is a 55 y.o. male with a past medical history significant for HTN, NIDDM, CAD with recent DES x2 who presents with presyncope and hypotension and AKI. During his recent admission his BP meds were adjusted and last night he had an episode of profuse diarrhea, came to the ER and found to be dehydrated, hypotensive and in ARF.    Subjective:    Antonio [name] today has, No headache, No chest pain, No abdominal pain - No Nausea, No new weakness tingling or numbness, No Cough - SOB.     Assessment  & Plan :     1.Dehydration, Hypotension with ARF - due to combination of Viral gastroenteritis, BP meds and Diuretics - Diarrhea has since resolved, appears nontoxic, currently afebrile without abdominal pain and tolerating diet, blood pressure and renal failure have almost completely resolved after hydration with IV fluids which will be continued, hold ACE/ARB and diuretic, hold Norvasc, low-dose Coreg. Monitor BMP and blood pressure closely. Renal US pending.  2. CAD. Recent DES 2 a few days ago, chest pain-free, continue dual antiplatelet therapy, continue statin, low-dose beta blocker, requested cardiology to reevaluate as he was discharged by them 2 days ago with Non specific EKG changes and mild Trop non ACS pattern leak likely from hypotension and ARF. TTE per Cards.  3. DM type II. On Lantus and  sliding scale will monitor. Outpatient control was poor as his recent A1c was 12.4.  CBG (last 3)   Recent Labs  12/27/15 0133 12/27/15 0746  GLUCAP 200* 122*    4.Hypertension. Blood pressure was low as in #1 above, for now low-dose beta blocker and monitor.  5. Dyslipidemia. On statin continue.    Family  Communication  : wife  Code Status :  Full  Diet : Heart Healthy Low carb  Disposition Plan  :  Tele from stepdown  Consults  :  Cards  Procedures  :    TTE  Renal US   DVT Prophylaxis  :  Lovenox    Lab Results  Component Value Date   PLT 208 12/27/2015    Inpatient Medications  Scheduled Meds: . aspirin  324 mg Oral Once  . aspirin EC  81 mg Oral Daily  . atorvastatin  80 mg Oral q1800  . clopidogrel  75 mg Oral Daily  . enoxaparin (LOVENOX) injection  40 mg Subcutaneous QHS  . insulin aspart  0-5 Units Subcutaneous QHS  . insulin aspart  0-9 Units Subcutaneous TID WC  . insulin glargine  15 Units Subcutaneous QHS  . sodium chloride flush  3 mL Intravenous Q12H   Continuous Infusions: . sodium chloride     PRN Meds:.acetaminophen **OR** acetaminophen, ondansetron **OR** ondansetron (ZOFRAN) IV  Antibiotics  :    Anti-infectives    None         Objective:   Vitals:   12/26/15 2345 12/27/15 0102 12/27/15 0341 12/27/15 0700  BP: 92/68 102/66 98/67 121/84  Pulse: 76 80 82 62  Resp: 17 17 19 14   Temp:  97.7 F (36.5 C) 98.1 F (36.7 C) 98.3 F (36.8 C)  TempSrc:  Oral Oral Oral  SpO2: 99% 98% 99% 97%  Weight:      Height:        Wt Readings from Last 3 Encounters:  12/26/15 88 kg (194 lb)  12/24/15 93 kg (205 lb 0.4 oz)  12/05/12 91.3 kg (201 lb 3.2 oz)     Intake/Output Summary (Last 24 hours) at 12/27/15 0825 Last data filed at 12/27/15 0800  Gross per 24 hour  Intake          1653.33 ml  Output             1275 ml  Net           378.33 ml     Physical Exam  Awake Alert, Oriented X 3, No new F.N deficits, Normal  affect [place].AT,PERRAL Supple Neck,No JVD, No cervical lymphadenopathy appriciated.  Symmetrical Chest wall movement, Good air movement bilaterally, CTAB RRR,No Gallops,Rubs or new Murmurs, No Parasternal Heave +ve B.Sounds, Abd Soft, No tenderness, No organomegaly appriciated, No rebound - guarding or rigidity. No Cyanosis, Clubbing or edema, No new Rash or bruise     Data Review:    CBC  Recent Labs Lab 12/22/15 1050 12/23/15 0421 12/24/15 0537 12/26/15 2126 12/27/15 0147  WBC 6.5 7.1 8.5 12.5* 9.6  HGB 13.0 13.1 14.2 14.0 11.9*  HCT 37.6* 39.9 42.8 43.1 37.2*  PLT 240 234 252 216 208  MCV 80.2 81.9 82.1 83.9 84.5  MCH 27.7 26.9 27.3 27.2 27.0  MCHC 34.6 32.8 33.2 32.5 32.0  RDW 14.9 15.1 15.1 15.6* 15.6*  LYMPHSABS 2.1  --   --  2.8  --   MONOABS 0.7  --   --  0.9  --   EOSABS 0.0  --   --  0.1  --   BASOSABS 0.0  --   --  0.0  --     Chemistries   Recent Labs Lab 12/22/15 1050 12/23/15 0421 12/24/15 0537 12/26/15 2126 12/27/15 0147  NA 136 136 135 136 138  K 3.4* 3.2* 3.1* 3.8 4.1  CL 101 102  104 104 107  CO2 25 26 21* 20* 25  GLUCOSE 303* 167* 193* 147* 202*  BUN 9 8 <5* 19 19  CREATININE 0.83 0.85 0.80 1.68* 1.30*  CALCIUM 9.2 8.9 9.3 9.6 9.1  AST 17 17  --  34  --   ALT 13* 14*  --  33  --   ALKPHOS 80 75  --  60  --   BILITOT 0.9 0.9  --  0.9  --    ------------------------------------------------------------------------------------------------------------------ No results for input(s): CHOL, HDL, LDLCALC, TRIG, CHOLHDL, LDLDIRECT in the last 72 hours.  Lab Results  Component Value Date   HGBA1C 12.4 (H) 12/22/2015   ------------------------------------------------------------------------------------------------------------------ No results for input(s): TSH, T4TOTAL, T3FREE, THYROIDAB in the last 72 hours.  Invalid input(s):  FREET3 ------------------------------------------------------------------------------------------------------------------ No results for input(s): VITAMINB12, FOLATE, FERRITIN, TIBC, IRON, RETICCTPCT in the last 72 hours.  Coagulation profile  Recent Labs Lab 12/22/15 1050 12/22/15 1804  INR 1.02 1.06    No results for input(s): DDIMER in the last 72 hours.  Cardiac Enzymes  Recent Labs Lab 12/22/15 2309 12/23/15 0421 12/27/15 0147  TROPONINI 0.24* 0.27* 0.11*   ------------------------------------------------------------------------------------------------------------------ No results found for: BNP  Micro Results Recent Results (from the past 240 hour(s))  MRSA PCR Screening     Status: None   Collection Time: 12/27/15  1:05 AM  Result Value Ref Range Status   MRSA by PCR NEGATIVE NEGATIVE Final    Comment:        The GeneXpert MRSA Assay (FDA approved for NASAL specimens only), is one component of a comprehensive MRSA colonization surveillance program. It is not intended to diagnose MRSA infection nor to guide or monitor treatment for MRSA infections.     Radiology Reports Dg Chest Port 1 View  Result Date: 12/26/2015 CLINICAL DATA:  55 year old male with near syncope. EXAM: PORTABLE CHEST 1 VIEW COMPARISON:  Chest radiograph dated 12/22/2015 FINDINGS: The heart size and mediastinal contours are within normal limits. Both lungs are clear. The visualized skeletal structures are unremarkable. IMPRESSION: No active disease. Electronically Signed   By: Elgie Collard M.D.   On: 12/26/2015 22:22   Dg Chest Port 1 View  Result Date: 12/22/2015 CLINICAL DATA:  Chest pain for several weeks, worsening. EXAM: PORTABLE CHEST 1 VIEW COMPARISON:  PA and lateral chest 10/23/2012. FINDINGS: The lungs are clear. Heart size is normal. No pneumothorax or pleural effusion. No bony abnormality. IMPRESSION: Negative chest. Electronically Signed   By: Drusilla Kanner M.D.   On:  12/22/2015 11:07    Time Spent in minutes  30   SINGH,PRASHANT K M.D on 12/27/2015 at 8:25 AM  Between 7am to 7pm - Pager - [phone]  After 7pm go to [email or web address] - password [facility]  [facility] -  Office  [phone]

## 2015-12-27 NOTE — Progress Notes (Signed)
Inpatient Diabetes Program Recommendations  AACE/ADA: New Consensus Statement on Inpatient Glycemic Control (2015)  Target Ranges:  Prepandial:   less than 140 mg/dL      Peak postprandial:   less than 180 mg/dL (1-2 hours)      Critically ill patients:  140 - 180 mg/dL   Lab Results  Component Value Date   GLUCAP 122 (H) 12/27/2015   HGBA1C 12.4 (H) 12/22/2015    Review of Glycemic ControlResults for KUSHAGRA, BIGHAM (MRN [id number]) as of 12/27/2015 08:50  Ref. Range 12/27/2015 01:33 12/27/2015 07:46  Glucose-Capillary Latest Ref Range: 65 - 99 mg/dL 409 (H) 811 (H)   Diabetes history: Type 2 diabetes Outpatient Diabetes medications: Novolog 70/30 mix 30 units bid, Metformin XR 1000 mg bid Current orders for Inpatient glycemic control:  Novolog sensitive tid with meals and HS, Lantus 15 units q HS  Inpatient Diabetes Program Recommendations:    Agree with current orders.  Note elevated A1C from past admission.  Patient was to follow-up with PCP.  Will follow.  Thanks, Beryl Meager, RN, BC-ADM Inpatient Diabetes Coordinator Pager [phone] (8a-5p)

## 2015-12-27 NOTE — Plan of Care (Signed)
Problem: Safety: Goal: Ability to remain free from injury will improve Outcome: Progressing Pt given education on the improtance of using the call bell and asking for assistance.

## 2015-12-28 ENCOUNTER — Inpatient Hospital Stay (HOSPITAL_COMMUNITY): Payer: Non-veteran care

## 2015-12-28 DIAGNOSIS — I952 Hypotension due to drugs: Secondary | ICD-10-CM

## 2015-12-28 DIAGNOSIS — E785 Hyperlipidemia, unspecified: Secondary | ICD-10-CM

## 2015-12-28 DIAGNOSIS — E1169 Type 2 diabetes mellitus with other specified complication: Secondary | ICD-10-CM

## 2015-12-28 DIAGNOSIS — R079 Chest pain, unspecified: Secondary | ICD-10-CM

## 2015-12-28 LAB — CBC
HEMATOCRIT: 37.9 % — AB (ref 39.0–52.0)
HEMOGLOBIN: 12.3 g/dL — AB (ref 13.0–17.0)
MCH: 27.3 pg (ref 26.0–34.0)
MCHC: 32.5 g/dL (ref 30.0–36.0)
MCV: 84.2 fL (ref 78.0–100.0)
PLATELETS: 219 10*3/uL (ref 150–400)
RBC: 4.5 MIL/uL (ref 4.22–5.81)
RDW: 15.4 % (ref 11.5–15.5)
WBC: 6.1 10*3/uL (ref 4.0–10.5)

## 2015-12-28 LAB — BASIC METABOLIC PANEL
ANION GAP: 6 (ref 5–15)
BUN: 8 mg/dL (ref 6–20)
CHLORIDE: 107 mmol/L (ref 101–111)
CO2: 25 mmol/L (ref 22–32)
Calcium: 8.7 mg/dL — ABNORMAL LOW (ref 8.9–10.3)
Creatinine, Ser: 0.92 mg/dL (ref 0.61–1.24)
GFR calc non Af Amer: >60 mL/min (ref >60)
Glucose, Bld: 133 mg/dL — ABNORMAL HIGH (ref 65–99)
POTASSIUM: 3.3 mmol/L — AB (ref 3.5–5.1)
SODIUM: 138 mmol/L (ref 135–145)

## 2015-12-28 LAB — ECHOCARDIOGRAM COMPLETE
Height: 67 in
Weight: 3104 oz

## 2015-12-28 LAB — GLUCOSE, CAPILLARY
GLUCOSE-CAPILLARY: 120 mg/dL — AB (ref 65–99)
Glucose-Capillary: 256 mg/dL — ABNORMAL HIGH (ref 65–99)

## 2015-12-28 MED ORDER — POTASSIUM CHLORIDE CRYS ER 20 MEQ PO TBCR
40.0000 meq | EXTENDED_RELEASE_TABLET | Freq: Once | ORAL | Status: AC
  Administered 2015-12-28: 40 meq via ORAL
  Filled 2015-12-28: qty 2

## 2015-12-28 MED ORDER — CARVEDILOL 3.125 MG PO TABS: 3.1250 mg | ORAL_TABLET | Freq: Two times a day (BID) | ORAL | 0 refills | Status: DC

## 2015-12-28 MED ORDER — AMLODIPINE BESYLATE 5 MG PO TABS
5.0000 mg | ORAL_TABLET | Freq: Every day | ORAL | Status: DC
  Administered 2015-12-28: 5 mg via ORAL
  Filled 2015-12-28: qty 1

## 2015-12-28 MED ORDER — INSULIN ASPART PROT & ASPART (70-30 MIX) 100 UNIT/ML PEN: 15.0000 [IU] | PEN_INJECTOR | Freq: Two times a day (BID) | SUBCUTANEOUS | 0 refills | Status: DC

## 2015-12-28 NOTE — Assessment & Plan Note (Signed)
I suspect that his syncopal was related to a vasovagal event after loose stools. This is exacerbated by him being on multiple antihypertensive agents.  Stressed the importance of hydration. Agree with holding either lisinopril or amlodipine for now. Would target mild permissive hypertension with pressures in the 1:30 to 150 range for now. We can probably titrate up medications as an outpatient.

## 2015-12-28 NOTE — Discharge Summary (Signed)
Physician Discharge Summary  Antonio [name] ZOX:[id number] DOB: 05/19/1960 DOA: 12/26/2015  PCP: Pcp Not In System  Admit date: 12/26/2015 Discharge date: 12/28/2015  Admitted From: Home  Disposition:  Home   Recommendations for Outpatient Follow-up:  1. Follow up with PCP in 1-2 weeks 2. Please obtain BMP/CBC in one week 3. Needs further adjustment of BP medications.     Discharge Condition: Stable.  CODE STATUS: Full code.  Diet recommendation: Heart Healthy   Brief/Interim Summary: Antonio Smithis a 55 y.o.malewith a past medical history significant for HTN, NIDDM, CAD with recent DES x2who presents with presyncope and hypotension and AKI. During his recent admission his BP meds were adjusted and last night he had an episode of profuse diarrhea, came to the ER and found to be dehydrated, hypotensive and in ARF.  1.Dehydration, Hypotension with ARF - due to combination of Viral gastroenteritis, BP meds and Diuretics - Diarrhea has since resolved, appears nontoxic, currently afebrile without abdominal pain and tolerating diet, blood pressure and renal failure have almost completely resolved after hydration. hold ACE/ARB and diuretic at discharge . Resume Norvasc BPincreasing, continue with lower -dose Coreg.  Renal US negative for hydronephrosis.   2. CAD. Recent DES 2 a few days prior to this admission, chest pain-free, continue dual antiplatelet therapy, continue statin, low-dose beta blocker, requested cardiology to reevaluate as he was discharged by them 2 days ago with Non specific EKG changes and mild Trop non ACS pattern leak likely from hypotension and ARF. TTE per Cards. Repeated ECHO with normal Ef.   3. DM type II. On Lantus and sliding scale will monitor. Outpatient control was poor as his recent A1c was 12.4.  CBG (last 3)   Recent Labs (last 2 labs)    Recent Labs  12/27/15 0133 12/27/15 0746  GLUCAP 200* 122*      4.Hypertension. Blood pressure was low as in  #1 above, . Coreg reduce to 3.125. BP increasing, will resume norvasc.  Continue to hold HCTZ and lisinopril.   5. Dyslipidemia. On statin continue.   Discharge Diagnoses:  Principal Problem:   Hypotension Active Problems:   Coronary artery disease due to lipid rich plaque   Type 2 diabetes mellitus without complication, without long-term current use of insulin (HCC)   Essential hypertension   AKI (acute kidney injury) (HCC)   Vasovagal near syncope   CAD S/P percutaneous coronary angioplasty    Discharge Instructions     Medication List    STOP taking these medications   hydrochlorothiazide 25 MG tablet Commonly known as:  HYDRODIURIL   lisinopril 40 MG tablet Commonly known as:  PRINIVIL,ZESTRIL   potassium chloride SA 20 MEQ tablet Commonly known as:  K-DUR,KLOR-CON     TAKE these medications   amLODipine 5 MG tablet Commonly known as:  NORVASC Take 1 tablet (5 mg total) by mouth daily.   aspirin EC 81 MG tablet Take 1 tablet (81 mg total) by mouth daily.   atorvastatin 80 MG tablet Commonly known as:  LIPITOR Take 1 tablet (80 mg total) by mouth daily at 6 PM.   carvedilol 3.125 MG tablet Commonly known as:  COREG Take 1 tablet (3.125 mg total) by mouth 2 (two) times daily with a meal. What changed:  medication strength  how much to take   clopidogrel 75 MG tablet Commonly known as:  PLAVIX Take 1 tablet (75 mg total) by mouth daily.   DSS 100 MG Caps Take 100 mg by mouth 2 (two)  times daily as needed for constipation. What changed:  when to take this  reasons to take this   insulin aspart protamine - aspart (70-30) 100 UNIT/ML FlexPen Commonly known as:  NOVOLOG MIX 70/30 FLEXPEN Inject 0.15 mLs (15 Units total) into the skin 2 (two) times daily before a meal. What changed:  how much to take   metFORMIN 500 MG 24 hr tablet Commonly known as:  GLUCOPHAGE-XR Take 1,000 mg by mouth 2 (two) times daily.   nitroGLYCERIN 0.4 MG SL  tablet Commonly known as:  NITROSTAT Place 0.4 mg under the tongue every 5 (five) minutes as needed for chest pain.   pantoprazole 40 MG tablet Commonly known as:  PROTONIX Take 40 mg by mouth daily as needed. For heartburn      Follow-up Information    Donato Schultz, MD Follow up in 1 week(s).   Specialty:  Cardiology Contact information: [address] [place] [address] [phone]          No Known Allergies  Consultations:  Cardiology    Procedures/Studies: US Renal  Result Date: 12/27/2015 CLINICAL DATA:  Acute renal insufficiency. History of diabetes and hypertension EXAM: RENAL / URINARY TRACT ULTRASOUND COMPLETE COMPARISON:  Report of a renal ultrasound of September 30, 2010 FINDINGS: Right Kidney: Length: 11.6 cm. The renal cortical echotexture remains lower than that of the adjacent liver. There is no hydronephrosis. In the lower pole there is a non obstructing shadowing echogenic focus consistent with a 4.5 mm stone. Left Kidney: Length: 13.1 cm. The renal cortical echotexture is normal. No stones or hydronephrosis are observed. Bladder: The partially distended urinary bladder exhibits subjective wall thickening to just over 1 cm. IMPRESSION: 1. No hydronephrosis. Probable nonobstructing 4.5 mm lower pole stone on the right. 2. Normal renal cortical echotexture bilaterally. 3. Partially distended urinary bladder. The bladder wall is subjectively thickened. Electronically Signed   By: David  [place] M.D.   On: 12/27/2015 09:09   Dg Chest Port 1 View  Result Date: 12/26/2015 CLINICAL DATA:  55 year old male with near syncope. EXAM: PORTABLE CHEST 1 VIEW COMPARISON:  Chest radiograph dated 12/22/2015 FINDINGS: The heart size and mediastinal contours are within normal limits. Both lungs are clear. The visualized skeletal structures are unremarkable. IMPRESSION: No active disease. Electronically Signed   By: Elgie Collard M.D.   On: 12/26/2015 22:22   Dg  Chest Port 1 View  Result Date: 12/22/2015 CLINICAL DATA:  Chest pain for several weeks, worsening. EXAM: PORTABLE CHEST 1 VIEW COMPARISON:  PA and lateral chest 10/23/2012. FINDINGS: The lungs are clear. Heart size is normal. No pneumothorax or pleural effusion. No bony abnormality. IMPRESSION: Negative chest. Electronically Signed   By: Drusilla Kanner M.D.   On: 12/22/2015 11:07       Subjective: Feeling better, denies diarrhea. He is back to baseline   Discharge Exam: Vitals:   12/28/15 0820 12/28/15 1016  BP: (!) 159/91 (!) 158/88  Pulse: 65 71  Resp:    Temp:     Vitals:   12/27/15 2203 12/28/15 0457 12/28/15 0820 12/28/15 1016  BP: 129/69 139/74 (!) 159/91 (!) 158/88  Pulse: 80 75 65 71  Resp: 18 18    Temp: 98.4 F (36.9 C) 98.1 F (36.7 C)    TempSrc: Oral Oral    SpO2: 100% 99%    Weight:      Height:        General: Pt is alert, awake, not in acute distress  Cardiovascular: RRR, S1/S2 +, no rubs, no gallops Respiratory: CTA bilaterally, no wheezing, no rhonchi Abdominal: Soft, NT, ND, bowel sounds + Extremities: no edema, no cyanosis    The results of significant diagnostics from this hospitalization (including imaging, microbiology, ancillary and laboratory) are listed below for reference.     Microbiology: Recent Results (from the past 240 hour(s))  MRSA PCR Screening     Status: None   Collection Time: 12/27/15  1:05 AM  Result Value Ref Range Status   MRSA by PCR NEGATIVE NEGATIVE Final    Comment:        The GeneXpert MRSA Assay (FDA approved for NASAL specimens only), is one component of a comprehensive MRSA colonization surveillance program. It is not intended to diagnose MRSA infection nor to guide or monitor treatment for MRSA infections.      Labs: BNP (last 3 results) No results for input(s): BNP in the last 8760 hours. Basic Metabolic Panel:  Recent Labs Lab 12/23/15 0421 12/24/15 0537 12/26/15 2126 12/27/15 0147  12/28/15 0328  NA 136 135 136 138 138  K 3.2* 3.1* 3.8 4.1 3.3*  CL 102 104 104 107 107  CO2 26 21* 20* 25 25  GLUCOSE 167* 193* 147* 202* 133*  BUN 8 <5* 19 19 8   CREATININE 0.85 0.80 1.68* 1.30* 0.92  CALCIUM 8.9 9.3 9.6 9.1 8.7*   Liver Function Tests:  Recent Labs Lab 12/22/15 1050 12/23/15 0421 12/26/15 2126  AST 17 17 34  ALT 13* 14* 33  ALKPHOS 80 75 60  BILITOT 0.9 0.9 0.9  PROT 7.7 6.6 7.2  ALBUMIN 4.1 3.3* 3.8   No results for input(s): LIPASE, AMYLASE in the last 168 hours. No results for input(s): AMMONIA in the last 168 hours. CBC:  Recent Labs Lab 12/22/15 1050 12/23/15 0421 12/24/15 0537 12/26/15 2126 12/27/15 0147 12/28/15 0328  WBC 6.5 7.1 8.5 12.5* 9.6 6.1  NEUTROABS 3.7  --   --  8.7*  --   --   HGB 13.0 13.1 14.2 14.0 11.9* 12.3*  HCT 37.6* 39.9 42.8 43.1 37.2* 37.9*  MCV 80.2 81.9 82.1 83.9 84.5 84.2  PLT 240 234 252 216 208 219   Cardiac Enzymes:  Recent Labs Lab 12/22/15 1804 12/22/15 2309 12/23/15 0421 12/27/15 0147 12/27/15 0729  TROPONINI 0.24* 0.24* 0.27* 0.11* 0.11*   BNP: Invalid input(s): POCBNP CBG:  Recent Labs Lab 12/27/15 0746 12/27/15 1142 12/27/15 1639 12/27/15 2144 12/28/15 0633  GLUCAP 122* 214* 195* 169* 120*   D-Dimer No results for input(s): DDIMER in the last 72 hours. Hgb A1c No results for input(s): HGBA1C in the last 72 hours. Lipid Profile No results for input(s): CHOL, HDL, LDLCALC, TRIG, CHOLHDL, LDLDIRECT in the last 72 hours. Thyroid function studies No results for input(s): TSH, T4TOTAL, T3FREE, THYROIDAB in the last 72 hours.  Invalid input(s): FREET3 Anemia work up No results for input(s): VITAMINB12, FOLATE, FERRITIN, TIBC, IRON, RETICCTPCT in the last 72 hours. Urinalysis    Component Value Date/Time   COLORURINE YELLOW 12/27/2015 0211   APPEARANCEUR CLEAR 12/27/2015 0211   LABSPEC 1.020 12/27/2015 0211   PHURINE 5.5 12/27/2015 0211   GLUCOSEU 100 (A) 12/27/2015 0211   HGBUR  NEGATIVE 12/27/2015 0211   BILIRUBINUR NEGATIVE 12/27/2015 0211   KETONESUR NEGATIVE 12/27/2015 0211   PROTEINUR NEGATIVE 12/27/2015 0211   UROBILINOGEN 0.2 10/23/2012 0249   NITRITE NEGATIVE 12/27/2015 0211   LEUKOCYTESUR NEGATIVE 12/27/2015 0211   Sepsis Labs Invalid input(s): PROCALCITONIN,  WBC,  LACTICIDVEN Microbiology Recent Results (from the past 240 hour(s))  MRSA PCR Screening     Status: None   Collection Time: 12/27/15  1:05 AM  Result Value Ref Range Status   MRSA by PCR NEGATIVE NEGATIVE Final    Comment:        The GeneXpert MRSA Assay (FDA approved for NASAL specimens only), is one component of a comprehensive MRSA colonization surveillance program. It is not intended to diagnose MRSA infection nor to guide or monitor treatment for MRSA infections.      Time coordinating discharge: Over 30 minutes  SIGNED:   Alba Coryegalado, Zariel Capano A, MD  [facility] 12/28/2015, 11:12 AM Pager   If 7PM-7AM, please contact night-coverage [email or web address] Password [email or web address]

## 2015-12-28 NOTE — Assessment & Plan Note (Signed)
Probably combined effect of vasovagal episode plus multiple anti-pretenses. Holding lisinopril for now. Carvedilol dose was reduced.

## 2015-12-28 NOTE — Subjective & Objective (Signed)
   CARDIOLOGY F/U NOTE  Primary Physician   Pcp Not In System Primary Cardiologist   Dr. Anne Fu Reason for Consultation   Hypotension Requesting Physician  Dr. Thedore Mins HPI: Antonio [name] is a 54 y.o. male with a history of hypertension, hyperlipidemia, diabetes, CAD and remote tobacco abuse and recent admission for non-STEMI who presented to ER by EMS for episode of near syncope.  The patient was admitted in 9/21-9/23 for non-STEMI. Cath showed patent stent in first diagonal, first OM and distal RCA. However has a single-vessel disease with 80% stenosis proximal RCA and 95% mid RCA status post DES placement x 2 to RCA. Post cath patient had a shortness of breath on Brillinta--> discontinued and loaded with Plavix. Prior to admission he was on Tenoretic, this was changed to Coreg and HCTZ. Amlodipine added due to elevated blood pressure. Continued home lisinopril. He was discharged stably on 12/24/15.   He presented on the morning of September 25 with an episode of what seemed like near syncope. His family hypotensive hollowing to loose bowel movements. The patient noticed some discomfort in his shoulders, but not a classic anginal symptoms.  His anti-hypertensives were held, and he has felt well since arrival after IV fluids were given.  We changed him to carvedilol low overdose, and amlodipine was started today as his blood pressures been elevated. Plan was to reassess wall motion abnormalities. Normal he could be discharged if stable from a hemodynamic standpoint.   He feels well overnight. No issues. Endplate without any problems. No further near syncope.  BP (!) 158/88   Pulse 71   Temp 98.1 F (36.7 C) (Oral)   Resp 18   Ht 5\' 7"  (1.702 m)   Wt 88 kg (194 lb)   SpO2 99%   BMI 30.38 kg/m  General: Well developed, well nourished, male in no acute distress Head: Eyes PERRLA, No xanthomas. Normocephalic and atraumatic, oropharynx without edema or exudate.  Lungs: Resp regular and  unlabored, CTA. Heart: RRR no s3, s4, or murmurs..   Neck: No carotid bruits. No lymphadenopathy.  No  JVD. Abdomen: Bowel sounds present, abdomen soft and non-tender without masses or hernias noted. Msk:  No spine or cva tenderness. No weakness, no joint deformities or effusions. Extremities: No clubbing, cyanosis or edema. DP/PT/Radials 2+ and equal bilaterally. Right radial cath site without hematoma or bruit. Neuro: Alert and oriented X 3. No focal deficits noted.   Echo 12/28/2015: Normal LV size with mild concentric atrophy. EF 5560% with no regional wall motion abnormality. Normal grade 1 diastolic dysfunction. Mild left atrial dilation. Otherwise essentially normal

## 2015-12-28 NOTE — Assessment & Plan Note (Signed)
On high-dose statin following non-STEMI.

## 2015-12-28 NOTE — Assessment & Plan Note (Signed)
No further anginal pains. Echocardiogram looked good. On aspirin and Plavix along with high-dose statin and beta blocker.

## 2015-12-28 NOTE — Assessment & Plan Note (Signed)
He actually does have relatively significant hypertension based on his current readings. Amlodipine was restarted today in addition to carvedilol 3.125 twice a day. I anticipate his being up to titrate up his medications as an outpatient, but for now would tolerate pressures in the 130 to 150 systolic range.

## 2015-12-28 NOTE — Progress Notes (Signed)
  Echocardiogram 2D Echocardiogram has been performed.  Antonio [name] M 12/28/2015, 9:23 AM

## 2015-12-28 NOTE — Progress Notes (Signed)
Subjective/Objective   CARDIOLOGY F/U NOTE  Primary Physician   Pcp Not In System Primary Cardiologist   Dr. Anne FuSkains Reason for Consultation   Hypotension Requesting Physician  Dr. Thedore MinsSingh HPI: Antonio [name] is a 55 y.o. male with a history of hypertension, hyperlipidemia, diabetes, CAD and remote tobacco abuse and recent admission for non-STEMI who presented to ER by EMS for episode of near syncope.  The patient was admitted in 9/21-9/23 for non-STEMI. Cath showed patent stent in first diagonal, first OM and distal RCA. However has a single-vessel disease with 80% stenosis proximal RCA and 95% mid RCA status post DES placement x 2 to RCA. Post cath patient had a shortness of breath on Brillinta--> discontinued and loaded with Plavix. Prior to admission he was on Tenoretic, this was changed to Coreg and HCTZ. Amlodipine added due to elevated blood pressure. Continued home lisinopril. He was discharged stably on 12/24/15.   He presented on the morning of September 25 with an episode of what seemed like near syncope. His family hypotensive hollowing to loose bowel movements. The patient noticed some discomfort in his shoulders, but not a classic anginal symptoms.  His anti-hypertensives were held, and he has felt well since arrival after IV fluids were given.  We changed him to carvedilol low overdose, and amlodipine was started today as his blood pressures been elevated. Plan was to reassess wall motion abnormalities. Normal he could be discharged if stable from a hemodynamic standpoint.   He feels well overnight. No issues. Endplate without any problems. No further near syncope.  BP (!) 158/88   Pulse 71   Temp 98.1 F (36.7 C) (Oral)   Resp 18   Ht 5\' 7"  (1.702 m)   Wt 88 kg (194 lb)   SpO2 99%   BMI 30.38 kg/m   General: Well developed, well nourished, male in no acute distress Head: Eyes PERRLA, No xanthomas. Normocephalic and atraumatic, oropharynx without edema or exudate.  Lungs:  Resp regular and unlabored, CTA. Heart: RRR no s3, s4, or murmurs..   Neck: No carotid bruits. No lymphadenopathy.  No  JVD. Abdomen: Bowel sounds present, abdomen soft and non-tender without masses or hernias noted. Msk:  No spine or cva tenderness. No weakness, no joint deformities or effusions. Extremities: No clubbing, cyanosis or edema. DP/PT/Radials 2+ and equal bilaterally. Right radial cath site without hematoma or bruit. Neuro: Alert and oriented X 3. No focal deficits noted.   Echo 12/28/2015: Normal LV size with mild concentric atrophy. EF 5560% with no regional wall motion abnormality. Normal grade 1 diastolic dysfunction. Mild left atrial dilation. Otherwise essentially normal       Scheduled Meds: . amLODipine  5 mg Oral Daily  . aspirin EC  81 mg Oral Daily  . atorvastatin  80 mg Oral q1800  . carvedilol  3.125 mg Oral BID WC  . clopidogrel  75 mg Oral Daily  . enoxaparin (LOVENOX) injection  40 mg Subcutaneous QHS  . insulin aspart  0-5 Units Subcutaneous QHS  . insulin aspart  0-9 Units Subcutaneous TID WC  . insulin glargine  15 Units Subcutaneous QHS  . pantoprazole  40 mg Oral Daily  . sodium chloride flush  3 mL Intravenous Q12H   Continuous Infusions:  PRN Meds:acetaminophen **OR** acetaminophen, nitroGLYCERIN, ondansetron **OR** ondansetron (ZOFRAN) IV  Vital signs in last 24 hours: Temp:  [98.1 F (36.7 C)-98.4 F (36.9 C)] 98.1 F (36.7 C) (09/27 0457) Pulse Rate:  [65-84] 71 (09/27 1016) Resp:  [  18] 18 (09/27 0457) BP: (129-159)/(69-91) 158/88 (09/27 1016) SpO2:  [99 %-100 %] 99 % (09/27 0457)  Intake/Output last 3 shifts: I/O last 3 completed shifts: In: 3092.1 [P.O.:960; I.V.:2132.1] Out: 2655 [Urine:2655] Intake/Output this shift: Total I/O In: 480 [P.O.:480] Out: 501 [Urine:500; Stool:1]  Problem Assessment/Plan Cardiovascular and Mediastinum  Essential hypertension  Assessment & Plan   He actually does have relatively significant  hypertension based on his current readings. Amlodipine was restarted today in addition to carvedilol 3.125 twice a day. I anticipate his being up to titrate up his medications as an outpatient, but for now would tolerate pressures in the 130 to 150 systolic range.     CAD S/P percutaneous coronary angioplasty  Assessment & Plan   No further anginal pains. Echocardiogram looked good. On aspirin and Plavix along with high-dose statin and beta blocker.     * Hypotension  Assessment & Plan   Probably combined effect of vasovagal episode plus multiple anti-pretenses. Holding lisinopril for now. Carvedilol dose was reduced.     Other  Hyperlipidemia due to type 2 diabetes mellitus (HCC)  Assessment & Plan   On high-dose statin following non-STEMI.     Vasovagal near syncope  Assessment & Plan   I suspect that his syncopal was related to a vasovagal event after loose stools. This is exacerbated by him being on multiple antihypertensive agents.  Stressed the importance of hydration. Agree with holding either lisinopril or amlodipine for now. Would target mild permissive hypertension with pressures in the 1:30 to 150 range for now. We can probably titrate up medications as an outpatient.       Overall he looks pretty stable and can probably be discharged today. Will help to arrange follow-up - had not been scheduled after his last discharge.   Bryan Lemma, M.D., M.S. Interventional Cardiologist   Pager # [phone] Phone # [phone] [address] [place], [place] [address]

## 2015-12-28 NOTE — Progress Notes (Signed)
Went over discharge instructions with patient and spouse. Explained current home regiment of blood pressure medications and follow up appointments. Called [facility] to d/c telemetry. Iv removed.  Minerva Ends

## 2015-12-30 ENCOUNTER — Encounter: Payer: Self-pay | Admitting: Nurse Practitioner

## 2015-12-30 ENCOUNTER — Encounter (INDEPENDENT_AMBULATORY_CARE_PROVIDER_SITE_OTHER): Payer: Self-pay

## 2015-12-30 ENCOUNTER — Other Ambulatory Visit: Payer: Self-pay | Admitting: *Deleted

## 2015-12-30 ENCOUNTER — Ambulatory Visit (INDEPENDENT_AMBULATORY_CARE_PROVIDER_SITE_OTHER): Payer: Self-pay | Admitting: Nurse Practitioner

## 2015-12-30 VITALS — BP 140/80 | HR 66 | Ht 67.0 in | Wt 194.8 lb

## 2015-12-30 DIAGNOSIS — I259 Chronic ischemic heart disease, unspecified: Secondary | ICD-10-CM

## 2015-12-30 DIAGNOSIS — R55 Syncope and collapse: Secondary | ICD-10-CM

## 2015-12-30 DIAGNOSIS — E785 Hyperlipidemia, unspecified: Secondary | ICD-10-CM

## 2015-12-30 DIAGNOSIS — I10 Essential (primary) hypertension: Secondary | ICD-10-CM

## 2015-12-30 LAB — CBC
HCT: 39.7 % (ref 38.5–50.0)
Hemoglobin: 13.1 g/dL — ABNORMAL LOW (ref 13.2–17.1)
MCH: 27.3 pg (ref 27.0–33.0)
MCHC: 33 g/dL (ref 32.0–36.0)
MCV: 82.9 fL (ref 80.0–100.0)
MPV: 10.9 fL (ref 7.5–12.5)
Platelets: 252 10*3/uL (ref 140–400)
RBC: 4.79 MIL/uL (ref 4.20–5.80)
RDW: 15.2 % — ABNORMAL HIGH (ref 11.0–15.0)
WBC: 6.3 10*3/uL (ref 3.8–10.8)

## 2015-12-30 LAB — BASIC METABOLIC PANEL
BUN: 12 mg/dL (ref 7–25)
CO2: 26 mmol/L (ref 20–31)
Calcium: 9.3 mg/dL (ref 8.6–10.3)
Chloride: 102 mmol/L (ref 98–110)
Creat: 0.89 mg/dL (ref 0.70–1.33)
Glucose, Bld: 204 mg/dL — ABNORMAL HIGH (ref 65–99)
Potassium: 4.1 mmol/L (ref 3.5–5.3)
Sodium: 137 mmol/L (ref 135–146)

## 2015-12-30 MED ORDER — LISINOPRIL 10 MG PO TABS: 10.0000 mg | ORAL_TABLET | Freq: Every day | ORAL | 3 refills | Status: DC

## 2015-12-30 NOTE — Patient Instructions (Addendum)
We will be checking the following labs today - BMET and CBC  BMET in one week - since we are putting you back on Lisinopril   Medication Instructions:    Continue with your current medicines. BUT  I am adding Lisinopril back but at 10 mg a day - this has been sent to the drug store and I am giving you paper RX to take to the [place]    Testing/Procedures To Be Arranged:  Stress Myoview  Follow-Up:   See Dr. Anne Fu in one month   Other Special Instructions:   Start monitoring your blood pressure at home - keep a record    If you need a refill on your cardiac medications before your next appointment, please call your pharmacy.   Call the [facility] office at [phone] if you have any questions, problems or concerns.

## 2015-12-30 NOTE — Addendum Note (Signed)
Addended by: Rosalio Macadamia on: 12/30/2015 10:16 AM   Modules accepted: Orders

## 2015-12-30 NOTE — Progress Notes (Addendum)
CARDIOLOGY OFFICE NOTE  Date:  12/30/2015    Antonio [name] Date of Birth: 11-11-1960 Medical Record #[id number]  PCP:  [facility]  Cardiologist:  [facility]   Chief Complaint  Patient presents with  . Coronary Artery Disease  . Hypertension    Post hospital visit - seen for Dr. Anne Fu    History of Present Illness: Antonio [name] is a 54 y.o. male who presents today for a post hospital visit. Seen for Dr. Anne Fu.   Just discharged 2 days ago.   He has a history of hypertension, hyperlipidemia, diabetes - uncontrolled, CAD and remote tobacco abuse and recent admission for non-STEMI.    The patient was admitted in 9/21-9/23 for non-STEMI. Cath showed patent stent in first diagonal, first OM and distal RCA. However had single-vessel disease with 80% stenosis proximal RCA and 95% mid RCA status post DES placement x 2 to RCA. Post cath patient had a shortness of breath on Brillinta--> discontinued and loaded with Plavix. Prior to admission he was on Tenoretic, this was changed to Coreg and HCTZ. Amlodipine added due to elevated blood pressure. Continued home lisinopril. He was discharged stably on 12/24/15.   Presented back with near syncope - he had made significant dietary changes. EMS was called. Per wife he almost passed out. EMS found him hypotensive with normal blood glucose and brought to ER further evaluation.  Upon arrival to ER his blood pressure was 85/60. Serum creatinine of 1.68. Point-of-care troponin negative. WBC 12.5. Elevated lactic acid. He was given total of 2 L of normal saline bolus. Serum creatinine increased to 1.3. WBC 9.6. Normal lactic acid. Troponin I 0.11-->0.11 (last admission troponin was 0.27 on 12/23/15.)  EKG shows sinus rhythm with a rate of 69 beats per minute and T-wave inversion in inferior lateral lead. Compared to EKG of 12/24/15 (discharge date) - there was new T-wave inversion in inferior lead and lead I, aVL and more prominent TWI in lead V4  to V6 - thought to possible be transitional changes from the recent MI given the extent of his RCA disease. His medicines were held but was able to get restarted on lower dose of Coreg. ACE and CCB were held. Echo was advised (showed normal EF) along with a Myoview in about a month to evaluate the proximal LAD lesion for possible ischemia given the anterolateral T wave inversions.   Comes in today. Here with his wife. He says he is feeling better. Has only been home for 2 days. Not checking his BP. No chest pain. Not dizzy. Not doing much. Taking his medicines. Diabetes very poorly controlled - says it is managed by the [place] and his dose of insulin was recently increased.   Past Medical History:  Diagnosis Date  . Diabetes (HCC)   . Diverticulosis   . GERD (gastroesophageal reflux disease)   . Heart attack [facility]) 2011   2 stents in [facility]  . Heart murmur   . Hemorrhoids   . Hyperlipidemia due to type 2 diabetes mellitus (HCC)   . Hypertension   . Stented coronary artery 2011    Past Surgical History:  Procedure Laterality Date  . CARDIAC CATHETERIZATION N/A 12/23/2015   Procedure: Left Heart Cath and Coronary Angiography;  Surgeon: Peter M Swaziland, MD;  Location: [facility] INVASIVE CV LAB;  Service: Cardiovascular;  Laterality: N/A;  . CARDIAC CATHETERIZATION N/A 12/23/2015   Procedure: Coronary Stent Intervention;  Surgeon: Peter M Swaziland, MD;  Location: [facility] INVASIVE CV  LAB;  Service: Cardiovascular;  Laterality: N/A;  . COLON RESECTION     12 inches taken out in 2011 at [facility] for diverticulosis  . COLONOSCOPY N/A 10/18/2012   Procedure: COLONOSCOPY;  Surgeon: Louis Meckelobert D Kaplan, MD;  [facility];  Service: Endoscopy;  Laterality: N/A;  . COLONOSCOPY N/A 10/22/2012   Procedure: COLONOSCOPY;  Surgeon: Iva Booparl E Gessner, MD;  [facility];  Service: Endoscopy;  Laterality: N/A;  . CORONARY ANGIOPLASTY WITH STENT PLACEMENT  2011   at [facility], 2 stents,  locations unknown  . ESOPHAGOGASTRODUODENOSCOPY N/A 10/18/2012   Procedure: ESOPHAGOGASTRODUODENOSCOPY (EGD);  Surgeon: Louis Meckelobert D Kaplan, MD;  [facility];  Service: Endoscopy;  Laterality: N/A;  . HERNIA REPAIR    . IRRIGATION AND DEBRIDEMENT ABSCESS N/A 10/26/2012   Procedure: IRRIGATION AND DEBRIDEMENT PERINEAL ABSCESS;  Surgeon: Wilmon ArmsMatthew K. Corliss Skainssuei, MD;  Location: [facility] OR;  Service: General;  Laterality: N/A;     Medications: Current Outpatient Prescriptions  Medication Sig Dispense Refill  . amLODipine (NORVASC) 5 MG tablet Take 1 tablet (5 mg total) by mouth daily. 30 tablet 12  . aspirin EC 81 MG tablet Take 1 tablet (81 mg total) by mouth daily. 1 tablet 0  . atorvastatin (LIPITOR) 80 MG tablet Take 1 tablet (80 mg total) by mouth daily at 6 PM. 30 tablet 11  . carvedilol (COREG) 3.125 MG tablet Take 1 tablet (3.125 mg total) by mouth 2 (two) times daily with a meal. 30 tablet 0  . clopidogrel (PLAVIX) 75 MG tablet Take 1 tablet (75 mg total) by mouth daily. 30 tablet 11  . docusate sodium 100 MG CAPS Take 100 mg by mouth 2 (two) times daily as needed for constipation. (Patient taking differently: Take 100 mg by mouth daily as needed (for constipation). ) 60 capsule 1  . insulin aspart protamine - aspart (NOVOLOG MIX 70/30 FLEXPEN) (70-30) 100 UNIT/ML FlexPen Inject 0.15 mLs (15 Units total) into the skin 2 (two) times daily before a meal. 15 mL 0  . metFORMIN (GLUCOPHAGE-XR) 500 MG 24 hr tablet Take 1,000 mg by mouth 2 (two) times daily.    . nitroGLYCERIN (NITROSTAT) 0.4 MG SL tablet Place 0.4 mg under the tongue every 5 (five) minutes as needed for chest pain.    . pantoprazole (PROTONIX) 40 MG tablet Take 40 mg by mouth daily as needed. For heartburn    . lisinopril (PRINIVIL,ZESTRIL) 10 MG tablet Take 1 tablet (10 mg total) by mouth daily. 90 tablet 3   No current facility-administered medications for this visit.     Allergies: No Known Allergies  Social History: The  patient  reports that he quit smoking about 13 years ago. His smoking use included Cigarettes. He has never used smokeless tobacco. He reports that he drinks alcohol. He reports that he does not use drugs.   Family History: The patient's family history includes Alzheimer's disease (age of onset: 6876) in his mother; CAD (age of onset: 4740) in his brother; Heart attack (age of onset: 8935) in his father.   Review of Systems: Please see the history of present illness.   Otherwise, the review of systems is positive for none.   All other systems are reviewed and negative.   Physical Exam: VS:  BP 140/80   Pulse 66   Ht 5\' 7"  (1.702 m)   Wt 194 lb 12.8 oz (88.4 kg)   SpO2 100% Comment: at rest  BMI 30.51 kg/m  .  BMI  Body mass index is 30.51 kg/m.  Wt Readings from Last 3 Encounters:  12/30/15 194 lb 12.8 oz (88.4 kg)  12/26/15 194 lb (88 kg)  12/24/15 205 lb 0.4 oz (93 kg)   BP was 150/100 by me  General: Pleasant. Well developed, well nourished and in no acute distress.   HEENT: Normal.  Neck: Supple, no JVD, carotid bruits, or masses noted.  Cardiac: Regular rate and rhythm. No murmurs, rubs, or gallops. No edema.  Respiratory:  Lungs are clear to auscultation bilaterally with normal work of breathing.  GI: Soft and nontender.  MS: No deformity or atrophy. Gait and ROM intact.  Skin: Warm and dry. Color is normal.  Neuro:  Strength and sensation are intact and no gross focal deficits noted.  Psych: Alert, appropriate and with normal affect.   LABORATORY DATA:  EKG:  EKG is ordered today. This demonstrates NSR with inferior T wave changes and persistent anterolateral T wave changes - rate is 63.  Lab Results  Component Value Date   WBC 6.1 12/28/2015   HGB 12.3 (L) 12/28/2015   HCT 37.9 (L) 12/28/2015   PLT 219 12/28/2015   GLUCOSE 133 (H) 12/28/2015   CHOL 188 12/23/2015   TRIG 138 12/23/2015   HDL 39 (L) 12/23/2015   LDLCALC 121 (H) 12/23/2015   ALT 33 12/26/2015   AST  34 12/26/2015   NA 138 12/28/2015   K 3.3 (L) 12/28/2015   CL 107 12/28/2015   CREATININE 0.92 12/28/2015   BUN 8 12/28/2015   CO2 25 12/28/2015   INR 1.06 12/22/2015   HGBA1C 12.4 (H) 12/22/2015    BNP (last 3 results) No results for input(s): BNP in the last 8760 hours.  ProBNP (last 3 results) No results for input(s): PROBNP in the last 8760 hours.   Other Studies Reviewed Today:  Echo Study Conclusions from 12/2015  - Left ventricle: The cavity size was normal. There was mild   concentric hypertrophy. Systolic function was normal. The   estimated ejection fraction was in the range of 55% to 60%. Wall   motion was normal; there were no regional wall motion   abnormalities. Doppler parameters are consistent with abnormal   left ventricular relaxation (grade 1 diastolic dysfunction).   There was no evidence of elevated ventricular filling pressure by   Doppler parameters. - Aortic valve: Trileaflet; normal thickness leaflets. There was no   regurgitation. - Aortic root: The aortic root was normal in size. - Mitral valve: Structurally normal valve. There was trivial   regurgitation. - Left atrium: The atrium was mildly dilated. - Right ventricle: Systolic function was normal. - Right atrium: The atrium was normal in size. - Tricuspid valve: There was no regurgitation. - Pulmonic valve: There was no regurgitation. - Pulmonary arteries: Systolic pressure could not be accurately   estimated. - Inferior vena cava: The vessel was normal in size. - Pericardium, extracardiac: There was no pericardial effusion.  Procedures   Coronary Stent Intervention  Left Heart Cath and Coronary Angiography 12/23/15  Conclusion     Ost LM lesion, 20 %stenosed.  Ost LAD lesion, 40 %stenosed.  Stent to the first diagonal, 0 %stenosed.  STent to 1st Mrg lesion, 0 %stenosed.  Stent to Dist RCA lesion, 0 %stenosed.  The left ventricular systolic function is normal.  LV end  diastolic pressure is normal.  The left ventricular ejection fraction is 55-65% by visual estimate.  A STENT PROMUS PREM MR 3.0X16 drug eluting stent was  successfully placed.  Mid RCA lesion, 95 %stenosed.  Post intervention, there is a 0% residual stenosis.  A STENT PROMUS PREM MR 3.5X12 drug eluting stent was successfully placed.  Ost RCA to Prox RCA lesion, 80 %stenosed.  Post intervention, there is a 0% residual stenosis.   1. Single vessel obstructive CAD- 80% proximal RCA, 95% mid RCA 2. Patent old stents in the first diagonal, first OM, and distal RCA 3. Normal LV function with normal LVEDP 4. Successful stenting of the mid RCA with a DES 5. Successful stenting of the proximal RCA with DES  Plan: DAPT. ASA Only 81 mg daily and Brilinta 90 mg bid. Patient may be a candidate for the TWILIGHT trial. Anticipate DC in am.       Assessment/Plan: 1. Recent NSTEMI with PCI to the RCA- on DAPT - not able to tolerate Brilinta - on Plavix - residual disease and abnormal EKG - no symptoms but will proceed with stress Myoview to further evaluate.   2. HTN - Bp by me is 150/100 - wife says this is where it normally runs - last BUN and creatinine were normal - will restart his ACE - was on 40 mg of Lisinopril - I am only restarting him at 10 mg a day. Lab today and BMET in one week.   3. Pre syncope - most likely due to over medication and change in diet - resolved.   4. HLD - on Lipitor - needs labs on return.   5. Uncontrolled DM - as evidenced by his A1C  Current medicines are reviewed with the patient today.  The patient does not have concerns regarding medicines other than what has been noted above.  The following changes have been made:  See above.  Labs/ tests ordered today include:    Orders Placed This Encounter  Procedures  . Basic metabolic panel  . CBC  . Basic metabolic panel  . Myocardial Perfusion Imaging  . EKG 12-Lead     Disposition:   FU with Dr.  Anne Fu in one month with fasting labs.   Patient is agreeable to this plan and will call if any problems develop in the interim.   Signed: Rosalio Macadamia, RN, ANP-C 12/30/2015 10:16 AM  [facility] [address] [place], [place]  [address] Phone: [phone] Fax: [phone]

## 2016-01-01 LAB — CULTURE, BLOOD (ROUTINE X 2)
Culture: NO GROWTH
Culture: NO GROWTH

## 2016-01-05 ENCOUNTER — Telehealth: Payer: Self-pay | Admitting: Nurse Practitioner

## 2016-01-05 ENCOUNTER — Encounter: Payer: Non-veteran care | Admitting: Physician Assistant

## 2016-01-06 ENCOUNTER — Other Ambulatory Visit (INDEPENDENT_AMBULATORY_CARE_PROVIDER_SITE_OTHER): Payer: Self-pay

## 2016-01-06 DIAGNOSIS — R55 Syncope and collapse: Secondary | ICD-10-CM

## 2016-01-06 DIAGNOSIS — I259 Chronic ischemic heart disease, unspecified: Secondary | ICD-10-CM

## 2016-01-06 LAB — BASIC METABOLIC PANEL
BUN: 10 mg/dL (ref 7–25)
CO2: 24 mmol/L (ref 20–31)
Calcium: 9.6 mg/dL (ref 8.6–10.3)
Chloride: 102 mmol/L (ref 98–110)
Creat: 0.88 mg/dL (ref 0.70–1.33)
Glucose, Bld: 138 mg/dL — ABNORMAL HIGH (ref 65–99)
Potassium: 4.1 mmol/L (ref 3.5–5.3)
Sodium: 136 mmol/L (ref 135–146)

## 2016-01-11 ENCOUNTER — Encounter (HOSPITAL_COMMUNITY): Payer: Non-veteran care

## 2016-01-11 NOTE — Telephone Encounter (Signed)
Will need to canx if no response from [place].  Pt aware

## 2016-01-12 ENCOUNTER — Telehealth (HOSPITAL_COMMUNITY): Payer: Self-pay | Admitting: *Deleted

## 2016-01-12 NOTE — Telephone Encounter (Signed)
Patient given detailed instructions per Myocardial Perfusion Study Information Sheet for the test on 01/17/16. Patient notified to arrive 15 minutes early and that it is imperative to arrive on time for appointment to keep from having the test rescheduled.  If you need to cancel or reschedule your appointment, please call the office within 24 hours of your appointment. Failure to do so may result in a cancellation of your appointment, and a $50 no show fee. Patient verbalized understanding.  Antonio [name], Antonio [name]    

## 2016-01-17 ENCOUNTER — Ambulatory Visit (HOSPITAL_COMMUNITY): Payer: Self-pay | Attending: Cardiology

## 2016-01-17 DIAGNOSIS — R9439 Abnormal result of other cardiovascular function study: Secondary | ICD-10-CM | POA: Insufficient documentation

## 2016-01-17 DIAGNOSIS — I10 Essential (primary) hypertension: Secondary | ICD-10-CM | POA: Insufficient documentation

## 2016-01-17 DIAGNOSIS — I259 Chronic ischemic heart disease, unspecified: Secondary | ICD-10-CM | POA: Insufficient documentation

## 2016-01-17 LAB — MYOCARDIAL PERFUSION IMAGING
Estimated workload: 8.5 METS
Exercise duration (min): 7 min
Exercise duration (sec): 1 s
LV dias vol: 130 mL (ref 62–150)
LV sys vol: 72 mL
MPHR: 165 {beats}/min
Peak HR: 150 {beats}/min
Percent HR: 91 %
RATE: 0.34
Rest HR: 54 {beats}/min
SDS: 4
SRS: 2
SSS: 6
TID: 0.95

## 2016-01-17 MED ORDER — TECHNETIUM TC 99M TETROFOSMIN IV KIT
11.0000 | PACK | Freq: Once | INTRAVENOUS | Status: AC | PRN
  Administered 2016-01-17: 11 via INTRAVENOUS
  Filled 2016-01-17: qty 11

## 2016-01-17 MED ORDER — TECHNETIUM TC 99M TETROFOSMIN IV KIT
32.5000 | PACK | Freq: Once | INTRAVENOUS | Status: AC | PRN
  Administered 2016-01-17: 32.5 via INTRAVENOUS
  Filled 2016-01-17: qty 33

## 2016-01-23 ENCOUNTER — Other Ambulatory Visit: Payer: Self-pay | Admitting: *Deleted

## 2016-01-23 ENCOUNTER — Encounter: Payer: Self-pay | Admitting: Cardiology

## 2016-01-23 ENCOUNTER — Ambulatory Visit (INDEPENDENT_AMBULATORY_CARE_PROVIDER_SITE_OTHER): Payer: Non-veteran care | Admitting: Cardiology

## 2016-01-23 VITALS — BP 128/76 | HR 64 | Ht 67.0 in | Wt 194.2 lb

## 2016-01-23 DIAGNOSIS — I10 Essential (primary) hypertension: Secondary | ICD-10-CM

## 2016-01-23 DIAGNOSIS — I259 Chronic ischemic heart disease, unspecified: Secondary | ICD-10-CM

## 2016-01-23 DIAGNOSIS — R55 Syncope and collapse: Secondary | ICD-10-CM | POA: Diagnosis not present

## 2016-01-23 DIAGNOSIS — I959 Hypotension, unspecified: Secondary | ICD-10-CM

## 2016-01-23 MED ORDER — ATORVASTATIN CALCIUM 80 MG PO TABS: 80.0000 mg | ORAL_TABLET | Freq: Every day | ORAL | 3 refills | Status: DC

## 2016-01-23 MED ORDER — CLOPIDOGREL BISULFATE 75 MG PO TABS: 75.0000 mg | ORAL_TABLET | Freq: Every day | ORAL | 3 refills | Status: DC

## 2016-01-23 MED ORDER — CARVEDILOL 3.125 MG PO TABS: 3.1250 mg | ORAL_TABLET | Freq: Two times a day (BID) | ORAL | 3 refills | Status: DC

## 2016-01-23 MED ORDER — AMLODIPINE BESYLATE 5 MG PO TABS: 5.0000 mg | ORAL_TABLET | Freq: Every day | ORAL | 3 refills | Status: DC

## 2016-01-23 NOTE — Telephone Encounter (Signed)
Changed pharmacy

## 2016-01-23 NOTE — Progress Notes (Signed)
Cardiology Office Note    Date:  01/23/2016   ID:  Antonio [name], DOB 1961-04-02, MRN [id number]  PCP:  [name]  Cardiologist:   Donato Schultz, MD   No chief complaint on file.   History of Present Illness:  Antonio [name] is a 55 y.o. male with non-ST elevation myocardial infarction 12/22/15 leading to catheterization showing patent stent in first diagonal, first obtuse marginal and distal RCA however he had new single-vessel disease with 80% stenosis of proximal RCA and 95% mid RCA and had stent placement 2 to these lesions. He shortness of breath with Antonio [name] and therefore Plavix was utilized.  He had near syncope, almost passed out. He was hypotensive with normal blood glucose. Blood pressure was 85/60. Creatinine was 1.68.  He felt better during visit on 12/30/15 with Antonio [name]. Difficult to control diabetes. [facility].   No new complaints. Overall doing well.   Past Medical History:  Diagnosis Date  . Diabetes (HCC)   . Diverticulosis   . GERD (gastroesophageal reflux disease)   . Heart attack 2011   2 stents in [facility]  . Heart murmur   . Hemorrhoids   . Hyperlipidemia due to type 2 diabetes mellitus (HCC)   . Hypertension   . Stented coronary artery 2011    Past Surgical History:  Procedure Laterality Date  . CARDIAC CATHETERIZATION N/A 12/23/2015   Procedure: Left Heart Cath and Coronary Angiography;  Surgeon: Peter M Swaziland, MD;  Location: [facility] INVASIVE CV LAB;  Service: Cardiovascular;  Laterality: N/A;  . CARDIAC CATHETERIZATION N/A 12/23/2015   Procedure: Coronary Stent Intervention;  Surgeon: Peter M Swaziland, MD;  Location: [facility] INVASIVE CV LAB;  Service: Cardiovascular;  Laterality: N/A;  . COLON RESECTION     12 inches taken out in 2011 at [facility] for diverticulosis  . COLONOSCOPY N/A 10/18/2012   Procedure: COLONOSCOPY;  Surgeon: Louis Meckel, MD;  Location: [facility] ENDOSCOPY;  Service: Endoscopy;  Laterality: N/A;  . COLONOSCOPY N/A 10/22/2012     Procedure: COLONOSCOPY;  Surgeon: Iva Boop, MD;  Location: [facility] ENDOSCOPY;  Service: Endoscopy;  Laterality: N/A;  . CORONARY ANGIOPLASTY WITH STENT PLACEMENT  2011   at [facility], 2 stents, locations unknown  . ESOPHAGOGASTRODUODENOSCOPY N/A 10/18/2012   Procedure: ESOPHAGOGASTRODUODENOSCOPY (EGD);  Surgeon: Louis Meckel, MD;  Location: [facility] ENDOSCOPY;  Service: Endoscopy;  Laterality: N/A;  . HERNIA REPAIR    . IRRIGATION AND DEBRIDEMENT ABSCESS N/A 10/26/2012   Procedure: IRRIGATION AND DEBRIDEMENT PERINEAL ABSCESS;  Surgeon: Wilmon Arms. Corliss Skains, MD;  Location: [facility] OR;  Service: General;  Laterality: N/A;    Current Medications: Outpatient Medications Prior to Visit  Medication Sig Dispense Refill  . aspirin EC 81 MG tablet Take 1 tablet (81 mg total) by mouth daily. 1 tablet 0  . insulin aspart protamine - aspart (NOVOLOG MIX 70/30 FLEXPEN) (70-30) 100 UNIT/ML FlexPen Inject 0.15 mLs (15 Units total) into the skin 2 (two) times daily before a meal. 15 mL 0  . lisinopril (PRINIVIL,ZESTRIL) 10 MG tablet Take 1 tablet (10 mg total) by mouth daily. 90 tablet 3  . metFORMIN (GLUCOPHAGE-XR) 500 MG 24 hr tablet Take 1,000 mg by mouth 2 (two) times daily.    . nitroGLYCERIN (NITROSTAT) 0.4 MG SL tablet Place 0.4 mg under the tongue every 5 (five) minutes as needed for chest pain.    . pantoprazole (PROTONIX) 40 MG tablet Take 40 mg by mouth daily as needed. For heartburn    .  amLODipine (NORVASC) 5 MG tablet Take 1 tablet (5 mg total) by mouth daily. 30 tablet 12  . atorvastatin (LIPITOR) 80 MG tablet Take 1 tablet (80 mg total) by mouth daily at 6 PM. 30 tablet 11  . carvedilol (COREG) 3.125 MG tablet Take 1 tablet (3.125 mg total) by mouth 2 (two) times daily with a meal. 30 tablet 0  . clopidogrel (PLAVIX) 75 MG tablet Take 1 tablet (75 mg total) by mouth daily. 30 tablet 11  . docusate sodium 100 MG CAPS Take 100 mg by mouth 2 (two) times daily as needed for constipation. (Patient  taking differently: Take 100 mg by mouth daily as needed (for constipation). ) 60 capsule 1   No facility-administered medications prior to visit.      Allergies:   Review of patient's allergies indicates no known allergies.   Social History   Social History  . Marital status: Married    Spouse name: N/A  . Number of children: N/A  . Years of education: N/A   Occupational History  . Retired Electronics engineer    Social History Main Topics  . Smoking status: Former Smoker    Types: Cigarettes    Quit date: 10/19/2002  . Smokeless tobacco: Never Used  . Alcohol use Yes     Comment: socially, one-2 drinks/month  . Drug use: No  . Sexual activity: Not Asked   Other Topics Concern  . None   Social History Narrative   Lives with wife in [place].     Family History:  The patient's family history includes Alzheimer's disease (age of onset: 76) in his mother; CAD (age of onset: 28) in his brother; Heart attack (age of onset: 77) in his father.   ROS:   Please see the history of present illness.    ROS All other systems reviewed and are negative.   PHYSICAL EXAM:   VS:  BP 128/76   Pulse 64   Ht 5\' 7"  (1.702 m)   Wt 194 lb 3.2 oz (88.1 kg)   BMI 30.42 kg/m    GEN: Well nourished, well developed, in no acute distress  HEENT: normal  Neck: no JVD, carotid bruits, or masses Cardiac: RRR; no murmurs, rubs, or gallops,no edema  Respiratory:  clear to auscultation bilaterally, normal work of breathing GI: soft, nontender, nondistended, + BS MS: no deformity or atrophy  Skin: warm and dry, no rash Neuro:  Alert and Oriented x 3, Strength and sensation are intact Psych: euthymic mood, full affect  Wt Readings from Last 3 Encounters:  01/23/16 194 lb 3.2 oz (88.1 kg)  01/17/16 194 lb (88 kg)  12/30/15 194 lb 12.8 oz (88.4 kg)      Studies/Labs Reviewed:   EKG:  None today  Recent Labs: 12/26/2015: ALT 33 12/30/2015: Hemoglobin 13.1; Platelets 252 01/06/2016: BUN 10; Creat  0.88; Potassium 4.1; Sodium 136   Lipid Panel    Component Value Date/Time   CHOL 188 12/23/2015 0421   TRIG 138 12/23/2015 0421   HDL 39 (L) 12/23/2015 0421   CHOLHDL 4.8 12/23/2015 0421   VLDL 28 12/23/2015 0421   LDLCALC 121 (H) 12/23/2015 0421    Additional studies/ records that were reviewed today include:  Echo Study Conclusions from 12/2015  - Left ventricle: The cavity size was normal. There was mild concentric hypertrophy. Systolic function was normal. The estimated ejection fraction was in the range of 55% to 60%. Wall motion was normal; there were no regional wall motion  abnormalities. Doppler parameters are consistent with abnormal left ventricular relaxation (grade 1 diastolic dysfunction). There was no evidence of elevated ventricular filling pressure by Doppler parameters. - Aortic valve: Trileaflet; normal thickness leaflets. There was no regurgitation. - Aortic root: The aortic root was normal in size. - Mitral valve: Structurally normal valve. There was trivial regurgitation. - Left atrium: The atrium was mildly dilated. - Right ventricle: Systolic function was normal. - Right atrium: The atrium was normal in size. - Tricuspid valve: There was no regurgitation. - Pulmonic valve: There was no regurgitation. - Pulmonary arteries: Systolic pressure could not be accurately estimated. - Inferior vena cava: The vessel was normal in size. - Pericardium, extracardiac: There was no pericardial effusion.  Procedures   Coronary Stent Intervention  Left Heart Cath and Coronary Angiography 12/23/15  Conclusion     Ost LM lesion, 20 %stenosed.  Ost LAD lesion, 40 %stenosed.  Stent to the first diagonal, 0 %stenosed.  STent to 1st Mrg lesion, 0 %stenosed.  Stent to Dist RCA lesion, 0 %stenosed.  The left ventricular systolic function is normal.  LV end diastolic pressure is normal.  The left ventricular ejection fraction is 55-65%  by visual estimate.  A STENT PROMUS PREM MR 3.0X16 drug eluting stent was successfully placed.  Mid RCA lesion, 95 %stenosed.  Post intervention, there is a 0% residual stenosis.  A STENT PROMUS PREM MR 3.5X12 drug eluting stent was successfully placed.  Ost RCA to Prox RCA lesion, 80 %stenosed.  Post intervention, there is a 0% residual stenosis.  1. Single vessel obstructive CAD- 80% proximal RCA, 95% mid RCA 2. Patent old stents in the first diagonal, first OM, and distal RCA 3. Normal LV function with normal LVEDP 4. Successful stenting of the mid RCA with a DES 5. Successful stenting of the proximal RCA with DES  Plan: DAPT. ASA Only 81 mg daily and Brilinta 90 mg bid. Patient may be a candidate for the TWILIGHT trial. Anticipate DC in am.     01/17/16 NUC stress  Nuclear stress EF is calculated at 45% but appears higher visually. Recommend correlation with echo.  Baseline EKG showed NSR with nonspecific T wave abnormality in the inferolateral leads.  During exercise there was significant wandering baseline artifact makindg interpretation difficult. In recovery there was 1mm of horizontal ST segement depression with more prominent T wave inversions.  There is a small defect of mild severity present in the basal inferior and mid inferior location. The defect is non-reversible and consistent with diaphragmatic attenuation. No ischemia noted.  This is a low risk study.  The left ventricular ejection fraction is mildly decreased (45-54%).  Blood pressure demonstrated a hypertensive response to exercise.   ASSESSMENT:    1. Ischemic heart disease   2. Near syncope   3. Essential hypertension   4. Hypotension, unspecified hypotension type      PLAN:  In order of problems listed above:  Non-ST elevation myocardial infarction 12/22/15  - RCA stents  - Plavix, aspirin.  - Stress Myoview did not show any evidence of significant ischemia, low risk. Ejection fraction  slightly reduced compared to echocardiogram but this may be artifact.  Prior hypotension-near syncope  - Medications were adjusted, continue with current dosing strategy. Blood pressure under good control currently.  Essential hypertension  - Continue with current regimen. 120/76 today.  Cardiac rehabilitation  - I would like for him to proceed with [facility].  Hyperlipidemia  - Continue atorvastatin 80.  Diabetes  - Encourage close monitoring and [facility].    Medication Adjustments/Labs and Tests Ordered: Current medicines are reviewed at length with the patient today.  Concerns regarding medicines are outlined above.  Medication changes, Labs and Tests ordered today are listed in the Patient Instructions below. Patient Instructions  Medication Instructions:  The current medical regimen is effective;  continue present plan and medications.  You are being referred to Cardiac Rehab at [facility].  Follow-Up: Follow up in 6 months with  Percell Miller, NP.  You will receive a letter in the mail 2 months before you are due.  Please call us when you receive this letter to schedule your follow up appointment.   If you need a refill on your cardiac medications before your next appointment, please call your pharmacy.  Thank you for choosing [facility]!!        Signed, Donato Schultz, MD  01/23/2016 4:08 PM    [facility] [address] [place], [place], [place]  [address] Phone: [phone]; Fax: [phone]

## 2016-01-23 NOTE — Patient Instructions (Signed)
Medication Instructions:  The current medical regimen is effective;  continue present plan and medications.  You are being referred to Cardiac Rehab at [facility].  Follow-Up: Follow up in 6 months with  Percell Miller, NP.  You will receive a letter in the mail 2 months before you are due.  Please call us when you receive this letter to schedule your follow up appointment.   If you need a refill on your cardiac medications before your next appointment, please call your pharmacy.  Thank you for choosing [facility]!!

## 2016-03-08 ENCOUNTER — Encounter (HOSPITAL_COMMUNITY): Payer: Self-pay

## 2016-03-08 ENCOUNTER — Encounter (HOSPITAL_COMMUNITY)
Admission: RE | Admit: 2016-03-08 | Discharge: 2016-03-08 | Disposition: A | Discharge status: Home / Self Care | Payer: Non-veteran care | Source: Ambulatory Visit | Attending: Cardiology | Admitting: Cardiology

## 2016-03-08 VITALS — BP 158/80 | HR 75 | Ht 67.25 in | Wt 198.4 lb

## 2016-03-08 DIAGNOSIS — Z955 Presence of coronary angioplasty implant and graft: Secondary | ICD-10-CM | POA: Insufficient documentation

## 2016-03-08 DIAGNOSIS — I214 Non-ST elevation (NSTEMI) myocardial infarction: Secondary | ICD-10-CM | POA: Diagnosis present

## 2016-03-08 NOTE — Progress Notes (Signed)
Cardiac Rehab Medication Review by a Pharmacist  Does the patient  feel that his/her medications are working for him/her?  yes  Has the patient been experiencing any side effects to the medications prescribed?  no  Does the patient measure his/her own blood pressure or blood glucose at home?  yes - both  Does the patient have any problems obtaining medications due to transportation or finances?   no  Understanding of regimen: good Understanding of indications: fair Potential of compliance: good    Pharmacist comments: Patient reports good understanding of medications with good compliance. No major issues reported.   Babs Bertin, PharmD, BCPS Clinical Pharmacist 03/08/2016 9:15 AM

## 2016-03-08 NOTE — Progress Notes (Signed)
Cardiac Individual Treatment Plan  Patient Details  Name: Antonio [name] MRN: [id number] Date of Birth: 07/21/1960 Referring Provider:   Flowsheet Row CARDIAC REHAB PHASE II ORIENTATION from 03/08/2016 in [facility] CARDIAC REHAB  Referring Provider  Donato Schultz MD      Initial Encounter Date:  Flowsheet Row CARDIAC REHAB PHASE II ORIENTATION from 03/08/2016 in [facility] CARDIAC REHAB  Date  03/08/16  Referring Provider  Donato Schultz MD      Visit Diagnosis: 12/22/15 NSTEMI (non-ST elevated myocardial infarction) [facility])  Status post coronary artery stent placement  Patient's Home Medications on Admission:  Current Outpatient Prescriptions:  .  amLODipine (NORVASC) 5 MG tablet, Take 1 tablet (5 mg total) by mouth daily., Disp: 90 tablet, Rfl: 3 .  aspirin EC 81 MG tablet, Take 1 tablet (81 mg total) by mouth daily., Disp: 1 tablet, Rfl: 0 .  atorvastatin (LIPITOR) 80 MG tablet, Take 1 tablet (80 mg total) by mouth daily at 6 PM., Disp: 90 tablet, Rfl: 3 .  carvedilol (COREG) 3.125 MG tablet, Take 1 tablet (3.125 mg total) by mouth 2 (two) times daily with a meal., Disp: 180 tablet, Rfl: 3 .  clopidogrel (PLAVIX) 75 MG tablet, Take 1 tablet (75 mg total) by mouth daily., Disp: 90 tablet, Rfl: 3 .  docusate sodium (COLACE) 100 MG capsule, Take 100 mg by mouth daily as needed for mild constipation., Disp: , Rfl:  .  insulin aspart protamine - aspart (NOVOLOG MIX 70/30 FLEXPEN) (70-30) 100 UNIT/ML FlexPen, Inject 0.15 mLs (15 Units total) into the skin 2 (two) times daily before a meal., Disp: 15 mL, Rfl: 0 .  insulin aspart protamine- aspart (NOVOLOG MIX 70/30) (70-30) 100 UNIT/ML injection, Inject 35 Units into the skin 2 (two) times daily before a meal., Disp: , Rfl:  .  lisinopril (PRINIVIL,ZESTRIL) 10 MG tablet, Take 1 tablet (10 mg total) by mouth daily., Disp: 90 tablet, Rfl: 3 .  metFORMIN (GLUCOPHAGE-XR) 500 MG 24 hr tablet, Take 1,000 mg by mouth 2  (two) times daily., Disp: , Rfl:  .  nitroGLYCERIN (NITROSTAT) 0.4 MG SL tablet, Place 0.4 mg under the tongue every 5 (five) minutes as needed for chest pain., Disp: , Rfl:  .  pantoprazole (PROTONIX) 40 MG tablet, Take 40 mg by mouth daily as needed. For heartburn, Disp: , Rfl:   Past Medical History: Past Medical History:  Diagnosis Date  . Diabetes (HCC)   . Diverticulosis   . GERD (gastroesophageal reflux disease)   . Heart attack 2011   2 stents in [facility]  . Heart murmur   . Hemorrhoids   . Hyperlipidemia due to type 2 diabetes mellitus (HCC)   . Hypertension   . Stented coronary artery 2011    Tobacco Use: History  Smoking Status  . Former Smoker  . Types: Cigarettes  . Quit date: 10/19/2002  Smokeless Tobacco  . Never Used    Labs: Recent Review Flowsheet Data    Labs for ITP Cardiac and Pulmonary Rehab Latest Ref Rng & Units 12/22/2015 12/23/2015   Cholestrol 0 - 200 mg/dL 782 956   LDLCALC 0 - 99 mg/dL 213(Y) 865(H)   HDL >84 mg/dL 69(G) 29(B)   Trlycerides <150 mg/dL 284(X) 324   Hemoglobin A1c 4.8 - 5.6 % 12.4(H) -      Capillary Blood Glucose: Lab Results  Component Value Date   GLUCAP 256 (H) 12/28/2015   GLUCAP 120 (H) 12/28/2015   GLUCAP  169 (H) 12/27/2015   GLUCAP 195 (H) 12/27/2015   GLUCAP 214 (H) 12/27/2015     Exercise Target Goals: Date: 03/08/16  Exercise Program Goal: Individual exercise prescription set with THRR, safety & activity barriers. Participant demonstrates ability to understand and report RPE using BORG scale, to self-measure pulse accurately, and to acknowledge the importance of the exercise prescription.  Exercise Prescription Goal: Starting with aerobic activity 30 plus minutes a day, 3 days per week for initial exercise prescription. Provide home exercise prescription and guidelines that participant acknowledges understanding prior to discharge.  Activity Barriers & Risk Stratification:     Activity Barriers &  Cardiac Risk Stratification - 03/08/16 0831      Activity Barriers & Cardiac Risk Stratification   Activity Barriers None   Cardiac Risk Stratification High      6 Minute Walk:     6 Minute Walk    Row Name 03/08/16 1100         6 Minute Walk   Phase Initial     Distance 2000 feet     Walk Time 6 minutes     # of Rest Breaks 0     MPH 3.8     METS 5.3     RPE 13     VO2 Peak 18.7     Symptoms No     Resting HR 70 bpm     Resting BP 158/80     Max Ex. HR 120 bpm     Max Ex. BP 182/88     2 Minute Post BP 136/74        Initial Exercise Prescription:     Initial Exercise Prescription - 03/08/16 1200      Date of Initial Exercise RX and Referring Provider   Date 03/08/16   Referring Provider Donato Schultz MD     Treadmill   MPH 3.2   Grade 1   Minutes 10   METs 3.89     Bike   Level 1   Minutes 10   METs 3.1     NuStep   Level 3   Minutes 10   METs 2     Prescription Details   Frequency (times per week) 3   Duration Progress to 30 minutes of continuous aerobic without signs/symptoms of physical distress     Intensity   THRR 40-80% of Max Heartrate 66-132   Ratings of Perceived Exertion 11-13     Progression   Progression Continue to progress workloads to maintain intensity without signs/symptoms of physical distress.     Resistance Training   Training Prescription Yes   Weight 3lbs   Reps 10-12      Perform Capillary Blood Glucose checks as needed.  Exercise Prescription Changes:   Exercise Comments:   Discharge Exercise Prescription (Final Exercise Prescription Changes):   Nutrition:  Target Goals: Understanding of nutrition guidelines, daily intake of sodium 1500mg , cholesterol 200mg , calories 30% from fat and 7% or less from saturated fats, daily to have 5 or more servings of fruits and vegetables.  Biometrics:     Pre Biometrics - 03/08/16 1119      Pre Biometrics   Waist Circumference 42 inches   Hip Circumference 40  inches   Waist to Hip Ratio 1.05 %   Triceps Skinfold 14 mm   % Body Fat 29.1 %   Grip Strength 41.5 kg   Flexibility 8 in   Single Leg Stand 30 seconds  Nutrition Therapy Plan and Nutrition Goals:   Nutrition Discharge: Nutrition Scores:   Nutrition Goals Re-Evaluation:   Psychosocial: Target Goals: Acknowledge presence or absence of depression, maximize coping skills, provide positive support system. Participant is able to verbalize types and ability to use techniques and skills needed for reducing stress and depression.  Initial Review & Psychosocial Screening:     Initial Psych Review & Screening - 03/08/16 1707      Family Dynamics   Good Support System? Yes   Comments Brief Psychosocial Assessment reveals no needs or further interventions.     Barriers   Psychosocial barriers to participate in program There are no identifiable barriers or psychosocial needs.      Quality of Life Scores:     Quality of Life - 03/08/16 1113      Quality of Life Scores   Health/Function Pre 26.67 %   Socioeconomic Pre 25.71 %   Psych/Spiritual Pre 27.14 %   Family Pre 28 %   GLOBAL Pre 26.76 %      PHQ-9: Recent Review Flowsheet Data    There is no flowsheet data to display.      Psychosocial Evaluation and Intervention:   Psychosocial Re-Evaluation:   Vocational Rehabilitation: Provide vocational rehab assistance to qualifying candidates.   Vocational Rehab Evaluation & Intervention:     Vocational Rehab - 03/08/16 1708      Initial Vocational Rehab Evaluation & Intervention   Assessment shows need for Vocational Rehabilitation No  Retired from the [employer]: Education Goals: Education classes will be provided on a weekly basis, covering required topics. Participant will state understanding/return demonstration of topics presented.  Learning Barriers/Preferences:     Learning Barriers/Preferences - 03/08/16 0831      Learning  Barriers/Preferences   Learning Barriers Sight   Learning Preferences Pictoral;Computer/Internet;Video;Written Material      Education Topics: Count Your Pulse:  -Group instruction provided by verbal instruction, demonstration, patient participation and written materials to support subject.  Instructors address importance of being able to find your pulse and how to count your pulse when at home without a heart monitor.  Patients get hands on experience counting their pulse with staff help and individually.   Heart Attack, Angina, and Risk Factor Modification:  -Group instruction provided by verbal instruction, video, and written materials to support subject.  Instructors address signs and symptoms of angina and heart attacks.    Also discuss risk factors for heart disease and how to make changes to improve heart health risk factors.   Functional Fitness:  -Group instruction provided by verbal instruction, demonstration, patient participation, and written materials to support subject.  Instructors address safety measures for doing things around the house.  Discuss how to get up and down off the floor, how to pick things up properly, how to safely get out of a chair without assistance, and balance training.   Meditation and Mindfulness:  -Group instruction provided by verbal instruction, patient participation, and written materials to support subject.  Instructor addresses importance of mindfulness and meditation practice to help reduce stress and improve awareness.  Instructor also leads participants through a meditation exercise.    Stretching for Flexibility and Mobility:  -Group instruction provided by verbal instruction, patient participation, and written materials to support subject.  Instructors lead participants through series of stretches that are designed to increase flexibility thus improving mobility.  These stretches are additional exercise for major muscle groups that are  typically performed  during regular warm up and cool down.   Hands Only CPR Anytime:  -Group instruction provided by verbal instruction, video, patient participation and written materials to support subject.  Instructors co-teach with AHA video for hands only CPR.  Participants get hands on experience with mannequins.   Nutrition I class: Heart Healthy Eating:  -Group instruction provided by PowerPoint slides, verbal discussion, and written materials to support subject matter. The instructor gives an explanation and review of the Therapeutic Lifestyle Changes diet recommendations, which includes a discussion on lipid goals, dietary fat, sodium, fiber, plant stanol/sterol esters, sugar, and the components of a well-balanced, healthy diet.   Nutrition II class: Lifestyle Skills:  -Group instruction provided by PowerPoint slides, verbal discussion, and written materials to support subject matter. The instructor gives an explanation and review of label reading, grocery shopping for heart health, heart healthy recipe modifications, and ways to make healthier choices when eating out.   Diabetes Question & Answer:  -Group instruction provided by PowerPoint slides, verbal discussion, and written materials to support subject matter. The instructor gives an explanation and review of diabetes co-morbidities, pre- and post-prandial blood glucose goals, pre-exercise blood glucose goals, signs, symptoms, and treatment of hypoglycemia and hyperglycemia, and foot care basics.   Diabetes Blitz:  -Group instruction provided by PowerPoint slides, verbal discussion, and written materials to support subject matter. The instructor gives an explanation and review of the physiology behind type 1 and type 2 diabetes, diabetes medications and rational behind using different medications, pre- and post-prandial blood glucose recommendations and Hemoglobin A1c goals, diabetes diet, and exercise including blood glucose  guidelines for exercising safely.    Portion Distortion:  -Group instruction provided by PowerPoint slides, verbal discussion, written materials, and food models to support subject matter. The instructor gives an explanation of serving size versus portion size, changes in portions sizes over the last 20 years, and what consists of a serving from each food group.   Stress Management:  -Group instruction provided by verbal instruction, video, and written materials to support subject matter.  Instructors review role of stress in heart disease and how to cope with stress positively.     Exercising on Your Own:  -Group instruction provided by verbal instruction, power point, and written materials to support subject.  Instructors discuss benefits of exercise, components of exercise, frequency and intensity of exercise, and end points for exercise.  Also discuss use of nitroglycerin and activating EMS.  Review options of places to exercise outside of rehab.  Review guidelines for sex with heart disease.   Cardiac Drugs I:  -Group instruction provided by verbal instruction and written materials to support subject.  Instructor reviews cardiac drug classes: antiplatelets, anticoagulants, beta blockers, and statins.  Instructor discusses reasons, side effects, and lifestyle considerations for each drug class.   Cardiac Drugs II:  -Group instruction provided by verbal instruction and written materials to support subject.  Instructor reviews cardiac drug classes: angiotensin converting enzyme inhibitors (ACE-I), angiotensin II receptor blockers (ARBs), nitrates, and calcium channel blockers.  Instructor discusses reasons, side effects, and lifestyle considerations for each drug class.   Anatomy and Physiology of the Circulatory System:  -Group instruction provided by verbal instruction, video, and written materials to support subject.  Reviews functional anatomy of heart, how it relates to various  diagnoses, and what role the heart plays in the overall system.   Knowledge Questionnaire Score:     Knowledge Questionnaire Score - 03/08/16 1100      Knowledge Questionnaire  Score   Pre Score 14/24      Core Components/Risk Factors/Patient Goals at Admission:     Personal Goals and Risk Factors at Admission - 03/08/16 0833      Core Components/Risk Factors/Patient Goals on Admission    Weight Management Yes;Weight Loss;Weight Maintenance   Intervention Weight Management: Develop a combined nutrition and exercise program designed to reach desired caloric intake, while maintaining appropriate intake of nutrient and fiber, sodium and fats, and appropriate energy expenditure required for the weight goal.;Weight Management: Provide education and appropriate resources to help participant work on and attain dietary goals.;Weight Management/Obesity: Establish reasonable short term and long term weight goals.;Obesity: Provide education and appropriate resources to help participant work on and attain dietary goals.   Expected Outcomes Short Term: Continue to assess and modify interventions until short term weight is achieved;Long Term: Adherence to nutrition and physical activity/exercise program aimed toward attainment of established weight goal;Weight Maintenance: Understanding of the daily nutrition guidelines, which includes 25-35% calories from fat, 7% or less cal from saturated fats, less than 200mg  cholesterol, less than 1.5gm of sodium, & 5 or more servings of fruits and vegetables daily;Weight Loss: Understanding of general recommendations for a balanced deficit meal plan, which promotes 1-2 lb weight loss per week and includes a negative energy balance of [phone] kcal/d;Understanding recommendations for meals to include 15-35% energy as protein, 25-35% energy from fat, 35-60% energy from carbohydrates, less than 200mg  of dietary cholesterol, 20-35 gm of total fiber daily;Understanding of  distribution of calorie intake throughout the day with the consumption of 4-5 meals/snacks   Sedentary Yes   Intervention Provide advice, education, support and counseling about physical activity/exercise needs.;Develop an individualized exercise prescription for aerobic and resistive training based on initial evaluation findings, risk stratification, comorbidities and participant's personal goals.   Expected Outcomes Achievement of increased cardiorespiratory fitness and enhanced flexibility, muscular endurance and strength shown through measurements of functional capacity and personal statement of participant.   Increase Strength and Stamina Yes   Intervention Provide advice, education, support and counseling about physical activity/exercise needs.;Develop an individualized exercise prescription for aerobic and resistive training based on initial evaluation findings, risk stratification, comorbidities and participant's personal goals.   Expected Outcomes Achievement of increased cardiorespiratory fitness and enhanced flexibility, muscular endurance and strength shown through measurements of functional capacity and personal statement of participant.   Diabetes Yes   Intervention Provide education about signs/symptoms and action to take for hypo/hyperglycemia.;Provide education about proper nutrition, including hydration, and aerobic/resistive exercise prescription along with prescribed medications to achieve blood glucose in normal ranges: Fasting glucose 65-99 mg/dL   Expected Outcomes Short Term: Participant verbalizes understanding of the signs/symptoms and immediate care of hyper/hypoglycemia, proper foot care and importance of medication, aerobic/resistive exercise and nutrition plan for blood glucose control.;Long Term: Attainment of HbA1C < 7%.   Hypertension Yes   Intervention Provide education on lifestyle modifcations including regular physical activity/exercise, weight management, moderate  sodium restriction and increased consumption of fresh fruit, vegetables, and low fat dairy, alcohol moderation, and smoking cessation.;Monitor prescription use compliance.   Expected Outcomes Short Term: Continued assessment and intervention until BP is < 140/21mm HG in hypertensive participants. < 130/71mm HG in hypertensive participants with diabetes, heart failure or chronic kidney disease.;Long Term: Maintenance of blood pressure at goal levels.   Lipids Yes   Intervention Provide education and support for participant on nutrition & aerobic/resistive exercise along with prescribed medications to achieve LDL 70mg , HDL >40mg .   Expected Outcomes Short  Term: Participant states understanding of desired cholesterol values and is compliant with medications prescribed. Participant is following exercise prescription and nutrition guidelines.;Long Term: Cholesterol controlled with medications as prescribed, with individualized exercise RX and with personalized nutrition plan. Value goals: LDL < 70mg , HDL > 40 mg.   Personal Goal Other Yes   Personal Goal short term: lose 5-10lbs and improve muscle mass  long term: goal weight 170lbs and pant size 36   Intervention Provide nutrition and exercise guidelines to assist improve cardiovascular fitness and losing weight   Expected Outcomes Pt will be able increase muscle mass and lose wt for a desired pant size of 36      Core Components/Risk Factors/Patient Goals Review:      Goals and Risk Factor Review    Row Name 03/08/16 8270             Core Components/Risk Factors/Patient Goals Review   Personal Goals Review Weight Management/Obesity;Sedentary;Increase Strength and Stamina          Core Components/Risk Factors/Patient Goals at Discharge (Final Review):      Goals and Risk Factor Review - 03/08/16 0832      Core Components/Risk Factors/Patient Goals Review   Personal Goals Review Weight Management/Obesity;Sedentary;Increase Strength and  Stamina      ITP Comments:     ITP Comments    Row Name 03/08/16 0829           ITP Comments Dr. Armanda Magic, Medical Director          Comments:  Pt is a veteran who has authorization from the [place] to receive care here at [facility], is in today for cardiac rehab orientation  from 0800-1030.  As a part of the orientation appt pt completed warm up stretches and 6 minute walk test.  Pt was asymptomatic with no complaints of cp or sob.  Monitor showed Sr with slight ST depression and inverted t wave.  This is present on pt ekg form the [place].  Pt is excited to attending full exercise on next week. Alanson Aly, BSN

## 2016-03-12 ENCOUNTER — Encounter (HOSPITAL_COMMUNITY)
Admission: RE | Admit: 2016-03-12 | Discharge: 2016-03-12 | Disposition: A | Discharge status: Home / Self Care | Payer: Non-veteran care | Source: Ambulatory Visit | Attending: Cardiology | Admitting: Cardiology

## 2016-03-12 ENCOUNTER — Encounter (HOSPITAL_COMMUNITY): Payer: Self-pay

## 2016-03-12 DIAGNOSIS — I214 Non-ST elevation (NSTEMI) myocardial infarction: Secondary | ICD-10-CM

## 2016-03-12 DIAGNOSIS — Z955 Presence of coronary angioplasty implant and graft: Secondary | ICD-10-CM

## 2016-03-12 LAB — GLUCOSE, CAPILLARY
GLUCOSE-CAPILLARY: 244 mg/dL — AB (ref 65–99)
GLUCOSE-CAPILLARY: 141 mg/dL — AB (ref 65–99)

## 2016-03-12 NOTE — Progress Notes (Signed)
Daily Session Note  Patient Details  Name: Antonio [name] MRN: [id number] Date of Birth: 1960/09/23 Referring Provider:   Flowsheet Row CARDIAC REHAB PHASE II ORIENTATION from 03/08/2016 in [place]  Referring Provider  Candee Furbish MD      Encounter Date: 03/12/2016  Check In:     Session Check In - 03/12/16 2767      Check-In   Location [facility]   Staff Present Su Hilt, MS, ACSM RCEP, Exercise Physiologist;Ashley Armstrong, MS, Exercise Physiologist;Kymora Sciara, RN, BSN   Supervising physician immediately available to respond to emergencies [facility] immediately available   Physician(s) Dr. Allyson Sabal   Medication changes reported     No   Fall or balance concerns reported    No   Warm-up and Cool-down Performed as group-led instruction   Resistance Training Performed Yes   VAD Patient? No     Pain Assessment   Currently in Pain? No/denies   Multiple Pain Sites No      Capillary Blood Glucose: Results for orders placed or performed during the hospital encounter of 03/12/16 (from the past 24 hour(s))  Glucose, capillary     Status: Abnormal   Collection Time: 03/12/16  9:17 AM  Result Value Ref Range   Glucose-Capillary 141 (H) 65 - 99 mg/dL     Goals Met:  Exercise tolerated well  Goals Unmet:  Not Applicable  Comments: Pt started cardiac rehab today.  Pt tolerated light exercise without difficulty. VSS, telemetry-sinus rhythm,  asymptomatic.  Medication list reconciled. Pt denies barriers to medicaiton compliance.  PSYCHOSOCIAL ASSESSMENT:  PHQ-0.  Pt exhibits positive coping skills, hopeful outlook with supportive family. No psychosocial needs identified at this time, no psychosocial interventions necessary.    Pt enjoys bowling, participates on a bowling league.  Pt goals for cardiac rehab are to lose weight.  Pt encouraged to participate in nutrition education and home exercise to achieve this goal.     Pt oriented to exercise equipment and routine.    Understanding verbalized.   Dr. Fransico Him is Medical Director for Cardiac Rehab at [facility].

## 2016-03-14 ENCOUNTER — Encounter (HOSPITAL_COMMUNITY)
Admission: RE | Admit: 2016-03-14 | Discharge: 2016-03-14 | Disposition: A | Discharge status: Home / Self Care | Payer: Non-veteran care | Source: Ambulatory Visit | Attending: Cardiology | Admitting: Cardiology

## 2016-03-14 DIAGNOSIS — I214 Non-ST elevation (NSTEMI) myocardial infarction: Secondary | ICD-10-CM | POA: Diagnosis not present

## 2016-03-14 DIAGNOSIS — Z955 Presence of coronary angioplasty implant and graft: Secondary | ICD-10-CM

## 2016-03-14 LAB — GLUCOSE, CAPILLARY
GLUCOSE-CAPILLARY: 200 mg/dL — AB (ref 65–99)
GLUCOSE-CAPILLARY: 136 mg/dL — AB (ref 65–99)

## 2016-03-16 ENCOUNTER — Encounter (HOSPITAL_COMMUNITY): Payer: Non-veteran care

## 2016-03-16 ENCOUNTER — Telehealth (HOSPITAL_COMMUNITY): Payer: Self-pay

## 2016-03-16 NOTE — Telephone Encounter (Signed)
Pt cancelled due to water issues at his home.

## 2016-03-19 ENCOUNTER — Encounter (HOSPITAL_COMMUNITY)
Admission: RE | Admit: 2016-03-19 | Discharge: 2016-03-19 | Disposition: A | Discharge status: Home / Self Care | Payer: Non-veteran care | Source: Ambulatory Visit | Attending: Cardiology | Admitting: Cardiology

## 2016-03-19 DIAGNOSIS — Z955 Presence of coronary angioplasty implant and graft: Secondary | ICD-10-CM

## 2016-03-19 DIAGNOSIS — I214 Non-ST elevation (NSTEMI) myocardial infarction: Secondary | ICD-10-CM

## 2016-03-19 LAB — GLUCOSE, CAPILLARY
GLUCOSE-CAPILLARY: 159 mg/dL — AB (ref 65–99)
Glucose-Capillary: 234 mg/dL — ABNORMAL HIGH (ref 65–99)

## 2016-03-21 ENCOUNTER — Encounter (HOSPITAL_COMMUNITY)
Admission: RE | Admit: 2016-03-21 | Discharge: 2016-03-21 | Disposition: A | Discharge status: Home / Self Care | Payer: Non-veteran care | Source: Ambulatory Visit | Attending: Cardiology | Admitting: Cardiology

## 2016-03-21 DIAGNOSIS — I214 Non-ST elevation (NSTEMI) myocardial infarction: Secondary | ICD-10-CM | POA: Diagnosis not present

## 2016-03-21 DIAGNOSIS — Z955 Presence of coronary angioplasty implant and graft: Secondary | ICD-10-CM

## 2016-03-21 LAB — GLUCOSE, CAPILLARY: Glucose-Capillary: 133 mg/dL — ABNORMAL HIGH (ref 65–99)

## 2016-03-21 NOTE — Progress Notes (Addendum)
Reviewed home exercise with pt today.  Pt plans to walk for exercise.  Reviewed THR, pulse, RPE, sign and symptoms, NTG use, and when to call 911 or MD.  Also discussed weather considerations and indoor options.  Pt voiced understanding.    Jessi Jessop,MS,ACSM RCEP 

## 2016-03-23 ENCOUNTER — Encounter (HOSPITAL_COMMUNITY): Payer: Non-veteran care

## 2016-03-23 ENCOUNTER — Ambulatory Visit (HOSPITAL_COMMUNITY): Payer: Non-veteran care

## 2016-03-28 ENCOUNTER — Encounter (HOSPITAL_COMMUNITY)
Admission: RE | Admit: 2016-03-28 | Discharge: 2016-03-28 | Disposition: A | Discharge status: Home / Self Care | Payer: Non-veteran care | Source: Ambulatory Visit | Attending: Cardiology | Admitting: Cardiology

## 2016-03-28 DIAGNOSIS — I214 Non-ST elevation (NSTEMI) myocardial infarction: Secondary | ICD-10-CM

## 2016-03-28 DIAGNOSIS — Z955 Presence of coronary angioplasty implant and graft: Secondary | ICD-10-CM

## 2016-03-28 LAB — GLUCOSE, CAPILLARY
GLUCOSE-CAPILLARY: 163 mg/dL — AB (ref 65–99)
GLUCOSE-CAPILLARY: 231 mg/dL — AB (ref 65–99)

## 2016-03-30 ENCOUNTER — Encounter (HOSPITAL_COMMUNITY): Payer: Non-veteran care

## 2016-03-30 ENCOUNTER — Ambulatory Visit (HOSPITAL_COMMUNITY): Payer: Non-veteran care

## 2016-04-04 ENCOUNTER — Encounter (HOSPITAL_COMMUNITY)
Admission: RE | Admit: 2016-04-04 | Discharge: 2016-04-04 | Disposition: A | Discharge status: Home / Self Care | Payer: Non-veteran care | Source: Ambulatory Visit | Attending: Cardiology | Admitting: Cardiology

## 2016-04-04 DIAGNOSIS — Z955 Presence of coronary angioplasty implant and graft: Secondary | ICD-10-CM | POA: Insufficient documentation

## 2016-04-04 DIAGNOSIS — I214 Non-ST elevation (NSTEMI) myocardial infarction: Secondary | ICD-10-CM | POA: Insufficient documentation

## 2016-04-04 NOTE — Progress Notes (Signed)
Cardiac Individual Treatment Plan  Patient Details  Name: Antonio [name] MRN: [id number] Date of Birth: Aug 27, 1960 Referring Provider:   Flowsheet Row CARDIAC REHAB PHASE II ORIENTATION from 03/08/2016 in [facility] CARDIAC REHAB  Referring Provider  Donato Schultz MD      Initial Encounter Date:  Flowsheet Row CARDIAC REHAB PHASE II ORIENTATION from 03/08/2016 in [facility] CARDIAC REHAB  Date  03/08/16  Referring Provider  Donato Schultz MD      Visit Diagnosis: No diagnosis found.  Patient's Home Medications on Admission:  Current Outpatient Prescriptions:  .  amLODipine (NORVASC) 5 MG tablet, Take 1 tablet (5 mg total) by mouth daily., Disp: 90 tablet, Rfl: 3 .  aspirin EC 81 MG tablet, Take 1 tablet (81 mg total) by mouth daily., Disp: 1 tablet, Rfl: 0 .  atorvastatin (LIPITOR) 80 MG tablet, Take 1 tablet (80 mg total) by mouth daily at 6 PM., Disp: 90 tablet, Rfl: 3 .  carvedilol (COREG) 3.125 MG tablet, Take 1 tablet (3.125 mg total) by mouth 2 (two) times daily with a meal., Disp: 180 tablet, Rfl: 3 .  clopidogrel (PLAVIX) 75 MG tablet, Take 1 tablet (75 mg total) by mouth daily., Disp: 90 tablet, Rfl: 3 .  docusate sodium (COLACE) 100 MG capsule, Take 100 mg by mouth daily as needed for mild constipation., Disp: , Rfl:  .  insulin aspart protamine - aspart (NOVOLOG MIX 70/30 FLEXPEN) (70-30) 100 UNIT/ML FlexPen, Inject 0.15 mLs (15 Units total) into the skin 2 (two) times daily before a meal., Disp: 15 mL, Rfl: 0 .  lisinopril (PRINIVIL,ZESTRIL) 10 MG tablet, Take 1 tablet (10 mg total) by mouth daily., Disp: 90 tablet, Rfl: 3 .  metFORMIN (GLUCOPHAGE-XR) 500 MG 24 hr tablet, Take 1,000 mg by mouth 2 (two) times daily., Disp: , Rfl:  .  nitroGLYCERIN (NITROSTAT) 0.4 MG SL tablet, Place 0.4 mg under the tongue every 5 (five) minutes as needed for chest pain., Disp: , Rfl:  .  pantoprazole (PROTONIX) 40 MG tablet, Take 40 mg by mouth daily as needed.  For heartburn, Disp: , Rfl:   Past Medical History: Past Medical History:  Diagnosis Date  . Diabetes (HCC)   . Diverticulosis   . GERD (gastroesophageal reflux disease)   . Heart attack 2011   2 stents in [facility]  . Heart murmur   . Hemorrhoids   . Hyperlipidemia due to type 2 diabetes mellitus (HCC)   . Hypertension   . Stented coronary artery 2011    Tobacco Use: History  Smoking Status  . Former Smoker  . Types: Cigarettes  . Quit date: 10/19/2002  Smokeless Tobacco  . Never Used    Labs: Recent Review Flowsheet Data    Labs for ITP Cardiac and Pulmonary Rehab Latest Ref Rng & Units 12/22/2015 12/23/2015   Cholestrol 0 - 200 mg/dL 409 811   LDLCALC 0 - 99 mg/dL 914(N) 829(F)   HDL >62 mg/dL 13(Y) 86(V)   Trlycerides <150 mg/dL 784(O) 962   Hemoglobin A1c 4.8 - 5.6 % 12.4(H) -      Capillary Blood Glucose: Lab Results  Component Value Date   GLUCAP 163 (H) 03/28/2016   GLUCAP 231 (H) 03/28/2016   GLUCAP 133 (H) 03/21/2016   GLUCAP 159 (H) 03/19/2016   GLUCAP 234 (H) 03/19/2016     Exercise Target Goals:    Exercise Program Goal: Individual exercise prescription set with THRR, safety & activity barriers. Participant demonstrates ability  to understand and report RPE using BORG scale, to self-measure pulse accurately, and to acknowledge the importance of the exercise prescription.  Exercise Prescription Goal: Starting with aerobic activity 30 plus minutes a day, 3 days per week for initial exercise prescription. Provide home exercise prescription and guidelines that participant acknowledges understanding prior to discharge.  Activity Barriers & Risk Stratification:     Activity Barriers & Cardiac Risk Stratification - 03/08/16 0831      Activity Barriers & Cardiac Risk Stratification   Activity Barriers None   Cardiac Risk Stratification High      6 Minute Walk:     6 Minute Walk    Row Name 03/08/16 1100         6 Minute Walk   Phase  Initial     Distance 2000 feet     Walk Time 6 minutes     # of Rest Breaks 0     MPH 3.8     METS 5.3     RPE 13     VO2 Peak 18.7     Symptoms No     Resting HR 70 bpm     Resting BP 158/80     Max Ex. HR 120 bpm     Max Ex. BP 182/88     2 Minute Post BP 136/74        Initial Exercise Prescription:     Initial Exercise Prescription - 03/08/16 1200      Date of Initial Exercise RX and Referring Provider   Date 03/08/16   Referring Provider Donato Schultz MD     Treadmill   MPH 3.2   Grade 1   Minutes 10   METs 3.89     Bike   Level 1   Minutes 10   METs 3.1     NuStep   Level 3   Minutes 10   METs 2     Prescription Details   Frequency (times per week) 3   Duration Progress to 30 minutes of continuous aerobic without signs/symptoms of physical distress     Intensity   THRR 40-80% of Max Heartrate 66-132   Ratings of Perceived Exertion 11-13     Progression   Progression Continue to progress workloads to maintain intensity without signs/symptoms of physical distress.     Resistance Training   Training Prescription Yes   Weight 3lbs   Reps 10-12      Perform Capillary Blood Glucose checks as needed.  Exercise Prescription Changes:     Exercise Prescription Changes    Row Name 03/22/16 1500 04/03/16 1000           Exercise Review   Progression Yes Yes        Response to Exercise   Blood Pressure (Admit) 126/78 158/80      Blood Pressure (Exercise) 158/104 170/90      Blood Pressure (Exit) 130/84 130/84  rchk BP 164/80      Heart Rate (Admit) 82 bpm 83 bpm      Heart Rate (Exercise) 133 bpm 134 bpm      Heart Rate (Exit) 89 bpm 83 bpm      Rating of Perceived Exertion (Exercise) 13 13      Symptoms none none      Comments Reviewed HEP on 03/19/16 see progress note Reviewed HEP on 03/19/16 see progress note      Duration Progress to 30 minutes of continuous aerobic without signs/symptoms of physical distress  Progress to 30 minutes of  continuous aerobic without signs/symptoms of physical distress      Intensity THRR unchanged THRR unchanged        Progression   Progression Continue progressive overload as per policy without signs/symptoms or physical distress. Continue progressive overload as per policy without signs/symptoms or physical distress.      Average METs 3.5 3.6        Resistance Training   Training Prescription Yes Yes      Weight 3lbs 3lbs      Reps 10-12 10-12        Treadmill   MPH 3.2 3.2      Grade 2 2      Minutes 10 10      METs 4.33 4.33        Bike   Level 1 1      Minutes 10 10      METs 3.13 314        NuStep   Level 3 3      Minutes 10 10      METs 2.9 3.4        Home Exercise Plan   Plans to continue exercise at Home  Reviewed on 03/19/16 see progress note Home  Reviewed on 03/19/16 see progress note      Frequency Add 2 additional days to program exercise sessions. Add 2 additional days to program exercise sessions.         Exercise Comments:     Exercise Comments    Row Name 03/22/16 1518           Exercise Comments Reviewed METs and goals. Pt is tolerating exercise well; will continue to monitor exercise progression.          Discharge Exercise Prescription (Final Exercise Prescription Changes):     Exercise Prescription Changes - 04/03/16 1000      Exercise Review   Progression Yes     Response to Exercise   Blood Pressure (Admit) 158/80   Blood Pressure (Exercise) 170/90   Blood Pressure (Exit) 130/84  rchk BP 164/80   Heart Rate (Admit) 83 bpm   Heart Rate (Exercise) 134 bpm   Heart Rate (Exit) 83 bpm   Rating of Perceived Exertion (Exercise) 13   Symptoms none   Comments Reviewed HEP on 03/19/16 see progress note   Duration Progress to 30 minutes of continuous aerobic without signs/symptoms of physical distress   Intensity THRR unchanged     Progression   Progression Continue progressive overload as per policy without signs/symptoms or physical  distress.   Average METs 3.6     Resistance Training   Training Prescription Yes   Weight 3lbs   Reps 10-12     Treadmill   MPH 3.2   Grade 2   Minutes 10   METs 4.33     Bike   Level 1   Minutes 10   METs 314     NuStep   Level 3   Minutes 10   METs 3.4     Home Exercise Plan   Plans to continue exercise at Home  Reviewed on 03/19/16 see progress note   Frequency Add 2 additional days to program exercise sessions.      Nutrition:  Target Goals: Understanding of nutrition guidelines, daily intake of sodium 1500mg , cholesterol 200mg , calories 30% from fat and 7% or less from saturated fats, daily to have 5 or more servings of fruits and vegetables.  Biometrics:     Pre Biometrics - 03/08/16 1119      Pre Biometrics   Waist Circumference 42 inches   Hip Circumference 40 inches   Waist to Hip Ratio 1.05 %   Triceps Skinfold 14 mm   % Body Fat 29.1 %   Grip Strength 41.5 kg   Flexibility 8 in   Single Leg Stand 30 seconds       Nutrition Therapy Plan and Nutrition Goals:     Nutrition Therapy & Goals - 03/12/16 1351      Nutrition Therapy   Diet Carb Modified, Therapeutic Lifestyle Changes     Personal Nutrition Goals   Personal Goal #1 1-2 lb wt loss/week to a wt loss goal of 6-24 lb at graduation from Cardiac Rehab     Intervention Plan   Intervention Prescribe, educate and counsel regarding individualized specific dietary modifications aiming towards targeted core components such as weight, hypertension, lipid management, diabetes, heart failure and other comorbidities.   Expected Outcomes Short Term Goal: Understand basic principles of dietary content, such as calories, fat, sodium, cholesterol and nutrients.;Long Term Goal: Adherence to prescribed nutrition plan.      Nutrition Discharge: Nutrition Scores:   Nutrition Goals Re-Evaluation:   Psychosocial: Target Goals: Acknowledge presence or absence of depression, maximize coping skills,  provide positive support system. Participant is able to verbalize types and ability to use techniques and skills needed for reducing stress and depression.  Initial Review & Psychosocial Screening:     Initial Psych Review & Screening - 03/08/16 1707      Family Dynamics   Good Support System? Yes   Comments Brief Psychosocial Assessment reveals no needs or further interventions.     Barriers   Psychosocial barriers to participate in program There are no identifiable barriers or psychosocial needs.      Quality of Life Scores:     Quality of Life - 03/08/16 1113      Quality of Life Scores   Health/Function Pre 26.67 %   Socioeconomic Pre 25.71 %   Psych/Spiritual Pre 27.14 %   Family Pre 28 %   GLOBAL Pre 26.76 %      PHQ-9: Recent Review Flowsheet Data    Depression screen [facility] 2/9 03/12/2016   Decreased Interest 0   Down, Depressed, Hopeless 0   PHQ - 2 Score 0      Psychosocial Evaluation and Intervention:     Psychosocial Evaluation - 03/12/16 0937      Psychosocial Evaluation & Interventions   Interventions Encouraged to exercise with the program and follow exercise prescription   Comments no psychosocial needs identified, no intervention necessary    Continued Psychosocial Services Needed No      Psychosocial Re-Evaluation:     Psychosocial Re-Evaluation    Row Name 04/03/16 1558             Psychosocial Re-Evaluation   Interventions Encouraged to attend Cardiac Rehabilitation for the exercise       Comments no psychosocial needs identified, no interventions necessary.        Continued Psychosocial Services Needed No          Vocational Rehabilitation: Provide vocational rehab assistance to qualifying candidates.   Vocational Rehab Evaluation & Intervention:     Vocational Rehab - 03/08/16 1708      Initial Vocational Rehab Evaluation & Intervention   Assessment shows need for Vocational Rehabilitation No  Retired from the [employer]  Education: Education Goals: Education classes will be provided on a weekly basis, covering required topics. Participant will state understanding/return demonstration of topics presented.  Learning Barriers/Preferences:     Learning Barriers/Preferences - 03/08/16 0831      Learning Barriers/Preferences   Learning Barriers Sight   Learning Preferences Pictoral;Computer/Internet;Video;Written Material      Education Topics: Count Your Pulse:  -Group instruction provided by verbal instruction, demonstration, patient participation and written materials to support subject.  Instructors address importance of being able to find your pulse and how to count your pulse when at home without a heart monitor.  Patients get hands on experience counting their pulse with staff help and individually.   Heart Attack, Angina, and Risk Factor Modification:  -Group instruction provided by verbal instruction, video, and written materials to support subject.  Instructors address signs and symptoms of angina and heart attacks.    Also discuss risk factors for heart disease and how to make changes to improve heart health risk factors.   Functional Fitness:  -Group instruction provided by verbal instruction, demonstration, patient participation, and written materials to support subject.  Instructors address safety measures for doing things around the house.  Discuss how to get up and down off the floor, how to pick things up properly, how to safely get out of a chair without assistance, and balance training.   Meditation and Mindfulness:  -Group instruction provided by verbal instruction, patient participation, and written materials to support subject.  Instructor addresses importance of mindfulness and meditation practice to help reduce stress and improve awareness.  Instructor also leads participants through a meditation exercise.    Stretching for Flexibility and Mobility:  -Group instruction  provided by verbal instruction, patient participation, and written materials to support subject.  Instructors lead participants through series of stretches that are designed to increase flexibility thus improving mobility.  These stretches are additional exercise for major muscle groups that are typically performed during regular warm up and cool down.   Hands Only CPR Anytime:  -Group instruction provided by verbal instruction, video, patient participation and written materials to support subject.  Instructors co-teach with AHA video for hands only CPR.  Participants get hands on experience with mannequins.   Nutrition I class: Heart Healthy Eating:  -Group instruction provided by PowerPoint slides, verbal discussion, and written materials to support subject matter. The instructor gives an explanation and review of the Therapeutic Lifestyle Changes diet recommendations, which includes a discussion on lipid goals, dietary fat, sodium, fiber, plant stanol/sterol esters, sugar, and the components of a well-balanced, healthy diet.   Nutrition II class: Lifestyle Skills:  -Group instruction provided by PowerPoint slides, verbal discussion, and written materials to support subject matter. The instructor gives an explanation and review of label reading, grocery shopping for heart health, heart healthy recipe modifications, and ways to make healthier choices when eating out.   Diabetes Question & Answer:  -Group instruction provided by PowerPoint slides, verbal discussion, and written materials to support subject matter. The instructor gives an explanation and review of diabetes co-morbidities, pre- and post-prandial blood glucose goals, pre-exercise blood glucose goals, signs, symptoms, and treatment of hypoglycemia and hyperglycemia, and foot care basics.   Diabetes Blitz:  -Group instruction provided by PowerPoint slides, verbal discussion, and written materials to support subject matter. The  instructor gives an explanation and review of the physiology behind type 1 and type 2 diabetes, diabetes medications and rational behind using different medications, pre- and post-prandial blood glucose recommendations and Hemoglobin  A1c goals, diabetes diet, and exercise including blood glucose guidelines for exercising safely.    Portion Distortion:  -Group instruction provided by PowerPoint slides, verbal discussion, written materials, and food models to support subject matter. The instructor gives an explanation of serving size versus portion size, changes in portions sizes over the last 20 years, and what consists of a serving from each food group.   Stress Management:  -Group instruction provided by verbal instruction, video, and written materials to support subject matter.  Instructors review role of stress in heart disease and how to cope with stress positively.     Exercising on Your Own:  -Group instruction provided by verbal instruction, power point, and written materials to support subject.  Instructors discuss benefits of exercise, components of exercise, frequency and intensity of exercise, and end points for exercise.  Also discuss use of nitroglycerin and activating EMS.  Review options of places to exercise outside of rehab.  Review guidelines for sex with heart disease.   Cardiac Drugs I:  -Group instruction provided by verbal instruction and written materials to support subject.  Instructor reviews cardiac drug classes: antiplatelets, anticoagulants, beta blockers, and statins.  Instructor discusses reasons, side effects, and lifestyle considerations for each drug class. Flowsheet Row CARDIAC REHAB PHASE II EXERCISE from 03/21/2016 in [facility] CARDIAC REHAB  Date  03/14/16  Instruction Review Code  2- meets goals/outcomes      Cardiac Drugs II:  -Group instruction provided by verbal instruction and written materials to support subject.  Instructor  reviews cardiac drug classes: angiotensin converting enzyme inhibitors (ACE-I), angiotensin II receptor blockers (ARBs), nitrates, and calcium channel blockers.  Instructor discusses reasons, side effects, and lifestyle considerations for each drug class.   Anatomy and Physiology of the Circulatory System:  -Group instruction provided by verbal instruction, video, and written materials to support subject.  Reviews functional anatomy of heart, how it relates to various diagnoses, and what role the heart plays in the overall system. Flowsheet Row CARDIAC REHAB PHASE II EXERCISE from 03/21/2016 in [facility] CARDIAC REHAB  Date  03/21/16  Instruction Review Code  2- meets goals/outcomes      Knowledge Questionnaire Score:     Knowledge Questionnaire Score - 03/08/16 1100      Knowledge Questionnaire Score   Pre Score 14/24      Core Components/Risk Factors/Patient Goals at Admission:     Personal Goals and Risk Factors at Admission - 03/08/16 0833      Core Components/Risk Factors/Patient Goals on Admission    Weight Management Yes;Weight Loss;Weight Maintenance   Intervention Weight Management: Develop a combined nutrition and exercise program designed to reach desired caloric intake, while maintaining appropriate intake of nutrient and fiber, sodium and fats, and appropriate energy expenditure required for the weight goal.;Weight Management: Provide education and appropriate resources to help participant work on and attain dietary goals.;Weight Management/Obesity: Establish reasonable short term and long term weight goals.;Obesity: Provide education and appropriate resources to help participant work on and attain dietary goals.   Expected Outcomes Short Term: Continue to assess and modify interventions until short term weight is achieved;Long Term: Adherence to nutrition and physical activity/exercise program aimed toward attainment of established weight goal;Weight  Maintenance: Understanding of the daily nutrition guidelines, which includes 25-35% calories from fat, 7% or less cal from saturated fats, less than 200mg  cholesterol, less than 1.5gm of sodium, & 5 or more servings of fruits and vegetables daily;Weight Loss: Understanding of general recommendations  for a balanced deficit meal plan, which promotes 1-2 lb weight loss per week and includes a negative energy balance of [phone] kcal/d;Understanding recommendations for meals to include 15-35% energy as protein, 25-35% energy from fat, 35-60% energy from carbohydrates, less than 200mg  of dietary cholesterol, 20-35 gm of total fiber daily;Understanding of distribution of calorie intake throughout the day with the consumption of 4-5 meals/snacks   Sedentary Yes   Intervention Provide advice, education, support and counseling about physical activity/exercise needs.;Develop an individualized exercise prescription for aerobic and resistive training based on initial evaluation findings, risk stratification, comorbidities and participant's personal goals.   Expected Outcomes Achievement of increased cardiorespiratory fitness and enhanced flexibility, muscular endurance and strength shown through measurements of functional capacity and personal statement of participant.   Increase Strength and Stamina Yes   Intervention Provide advice, education, support and counseling about physical activity/exercise needs.;Develop an individualized exercise prescription for aerobic and resistive training based on initial evaluation findings, risk stratification, comorbidities and participant's personal goals.   Expected Outcomes Achievement of increased cardiorespiratory fitness and enhanced flexibility, muscular endurance and strength shown through measurements of functional capacity and personal statement of participant.   Diabetes Yes   Intervention Provide education about signs/symptoms and action to take for  hypo/hyperglycemia.;Provide education about proper nutrition, including hydration, and aerobic/resistive exercise prescription along with prescribed medications to achieve blood glucose in normal ranges: Fasting glucose 65-99 mg/dL   Expected Outcomes Short Term: Participant verbalizes understanding of the signs/symptoms and immediate care of hyper/hypoglycemia, proper foot care and importance of medication, aerobic/resistive exercise and nutrition plan for blood glucose control.;Long Term: Attainment of HbA1C < 7%.   Hypertension Yes   Intervention Provide education on lifestyle modifcations including regular physical activity/exercise, weight management, moderate sodium restriction and increased consumption of fresh fruit, vegetables, and low fat dairy, alcohol moderation, and smoking cessation.;Monitor prescription use compliance.   Expected Outcomes Short Term: Continued assessment and intervention until BP is < 140/76mm HG in hypertensive participants. < 130/42mm HG in hypertensive participants with diabetes, heart failure or chronic kidney disease.;Long Term: Maintenance of blood pressure at goal levels.   Lipids Yes   Intervention Provide education and support for participant on nutrition & aerobic/resistive exercise along with prescribed medications to achieve LDL 70mg , HDL >40mg .   Expected Outcomes Short Term: Participant states understanding of desired cholesterol values and is compliant with medications prescribed. Participant is following exercise prescription and nutrition guidelines.;Long Term: Cholesterol controlled with medications as prescribed, with individualized exercise RX and with personalized nutrition plan. Value goals: LDL < 70mg , HDL > 40 mg.   Personal Goal Other Yes   Personal Goal short term: lose 5-10lbs and improve muscle mass  long term: goal weight 170lbs and pant size 36   Intervention Provide nutrition and exercise guidelines to assist improve cardiovascular fitness and  losing weight   Expected Outcomes Pt will be able increase muscle mass and lose wt for a desired pant size of 36      Core Components/Risk Factors/Patient Goals Review:      Goals and Risk Factor Review    Row Name 03/08/16 4098 03/22/16 1518           Core Components/Risk Factors/Patient Goals Review   Personal Goals Review Weight Management/Obesity;Sedentary;Increase Strength and Stamina Other      Review  - Pt is liking CRPII and finds it beneficial . Pt is walking at home 1x/week, encouaged walking another day for a total of 5 days of aerobic activiity.  Expected Outcomes  - Pt will be compliant with HEP and improve in cardiorespiratory fitness         Core Components/Risk Factors/Patient Goals at Discharge (Final Review):      Goals and Risk Factor Review - 03/22/16 1518      Core Components/Risk Factors/Patient Goals Review   Personal Goals Review Other   Review Pt is liking CRPII and finds it beneficial . Pt is walking at home 1x/week, encouaged walking another day for a total of 5 days of aerobic activiity.   Expected Outcomes Pt will be compliant with HEP and improve in cardiorespiratory fitness      ITP Comments:     ITP Comments    Row Name 03/08/16 0829           ITP Comments Dr. Armanda Magic, Medical Director          Comments: Pt is making expected progress toward personal goals after completing 6 sessions. Recommend continued exercise and life style modification education including  stress management and relaxation techniques to decrease cardiac risk profile.

## 2016-04-06 ENCOUNTER — Encounter (HOSPITAL_COMMUNITY): Payer: Non-veteran care

## 2016-04-06 ENCOUNTER — Ambulatory Visit (HOSPITAL_COMMUNITY): Payer: Non-veteran care

## 2016-04-09 ENCOUNTER — Encounter (HOSPITAL_COMMUNITY)
Admission: RE | Admit: 2016-04-09 | Discharge: 2016-04-09 | Disposition: A | Discharge status: Home / Self Care | Payer: Non-veteran care | Source: Ambulatory Visit | Attending: Cardiology | Admitting: Cardiology

## 2016-04-09 DIAGNOSIS — Z955 Presence of coronary angioplasty implant and graft: Secondary | ICD-10-CM

## 2016-04-09 DIAGNOSIS — I214 Non-ST elevation (NSTEMI) myocardial infarction: Secondary | ICD-10-CM

## 2016-04-09 LAB — GLUCOSE, CAPILLARY: GLUCOSE-CAPILLARY: 195 mg/dL — AB (ref 65–99)

## 2016-04-11 ENCOUNTER — Encounter (HOSPITAL_COMMUNITY)
Admission: RE | Admit: 2016-04-11 | Discharge: 2016-04-11 | Disposition: A | Discharge status: Home / Self Care | Payer: Non-veteran care | Source: Ambulatory Visit | Attending: Cardiology | Admitting: Cardiology

## 2016-04-11 DIAGNOSIS — I214 Non-ST elevation (NSTEMI) myocardial infarction: Secondary | ICD-10-CM

## 2016-04-11 DIAGNOSIS — Z955 Presence of coronary angioplasty implant and graft: Secondary | ICD-10-CM

## 2016-04-11 LAB — GLUCOSE, CAPILLARY: GLUCOSE-CAPILLARY: 158 mg/dL — AB (ref 65–99)

## 2016-04-13 ENCOUNTER — Ambulatory Visit (HOSPITAL_COMMUNITY): Payer: Non-veteran care

## 2016-04-13 ENCOUNTER — Encounter (HOSPITAL_COMMUNITY): Payer: Non-veteran care

## 2016-04-16 ENCOUNTER — Encounter (HOSPITAL_COMMUNITY): Payer: Non-veteran care

## 2016-04-18 ENCOUNTER — Telehealth (HOSPITAL_COMMUNITY): Payer: Self-pay

## 2016-04-18 ENCOUNTER — Encounter (HOSPITAL_COMMUNITY): Payer: Non-veteran care

## 2016-04-18 NOTE — Telephone Encounter (Signed)
Pt called to cancel due to weather.... KJ °

## 2016-04-20 ENCOUNTER — Encounter (HOSPITAL_COMMUNITY): Payer: Non-veteran care

## 2016-04-20 ENCOUNTER — Ambulatory Visit (HOSPITAL_COMMUNITY): Payer: Non-veteran care

## 2016-04-23 ENCOUNTER — Encounter (HOSPITAL_COMMUNITY)
Admission: RE | Admit: 2016-04-23 | Discharge: 2016-04-23 | Disposition: A | Discharge status: Home / Self Care | Payer: Non-veteran care | Source: Ambulatory Visit | Attending: Cardiology | Admitting: Cardiology

## 2016-04-23 DIAGNOSIS — I214 Non-ST elevation (NSTEMI) myocardial infarction: Secondary | ICD-10-CM | POA: Diagnosis not present

## 2016-04-23 DIAGNOSIS — Z955 Presence of coronary angioplasty implant and graft: Secondary | ICD-10-CM

## 2016-04-23 LAB — GLUCOSE, CAPILLARY: GLUCOSE-CAPILLARY: 218 mg/dL — AB (ref 65–99)

## 2016-04-25 ENCOUNTER — Encounter (HOSPITAL_COMMUNITY)
Admission: RE | Admit: 2016-04-25 | Discharge: 2016-04-25 | Disposition: A | Discharge status: Home / Self Care | Payer: Non-veteran care | Source: Ambulatory Visit | Attending: Cardiology | Admitting: Cardiology

## 2016-04-25 DIAGNOSIS — I214 Non-ST elevation (NSTEMI) myocardial infarction: Secondary | ICD-10-CM | POA: Diagnosis not present

## 2016-04-25 DIAGNOSIS — Z955 Presence of coronary angioplasty implant and graft: Secondary | ICD-10-CM

## 2016-04-25 LAB — GLUCOSE, CAPILLARY: Glucose-Capillary: 201 mg/dL — ABNORMAL HIGH (ref 65–99)

## 2016-04-27 ENCOUNTER — Ambulatory Visit (HOSPITAL_COMMUNITY): Payer: Non-veteran care

## 2016-04-27 ENCOUNTER — Encounter (HOSPITAL_COMMUNITY): Payer: Non-veteran care

## 2016-04-30 ENCOUNTER — Encounter (HOSPITAL_COMMUNITY)
Admission: RE | Admit: 2016-04-30 | Discharge: 2016-04-30 | Disposition: A | Discharge status: Home / Self Care | Payer: Non-veteran care | Source: Ambulatory Visit | Attending: Cardiology | Admitting: Cardiology

## 2016-04-30 DIAGNOSIS — I214 Non-ST elevation (NSTEMI) myocardial infarction: Secondary | ICD-10-CM | POA: Diagnosis not present

## 2016-04-30 DIAGNOSIS — Z955 Presence of coronary angioplasty implant and graft: Secondary | ICD-10-CM

## 2016-04-30 LAB — GLUCOSE, CAPILLARY
GLUCOSE-CAPILLARY: 253 mg/dL — AB (ref 65–99)
Glucose-Capillary: 185 mg/dL — ABNORMAL HIGH (ref 65–99)

## 2016-05-02 ENCOUNTER — Encounter (HOSPITAL_COMMUNITY)
Admission: RE | Admit: 2016-05-02 | Discharge: 2016-05-02 | Disposition: A | Discharge status: Home / Self Care | Payer: Non-veteran care | Source: Ambulatory Visit | Attending: Cardiology | Admitting: Cardiology

## 2016-05-02 DIAGNOSIS — I214 Non-ST elevation (NSTEMI) myocardial infarction: Secondary | ICD-10-CM | POA: Diagnosis not present

## 2016-05-02 DIAGNOSIS — Z955 Presence of coronary angioplasty implant and graft: Secondary | ICD-10-CM

## 2016-05-02 NOTE — Progress Notes (Signed)
Cardiac Individual Treatment Plan  Patient Details  Name: Antonio [name] MRN: [id number] Date of Birth: 1960/06/23 Referring Provider:   Flowsheet Row CARDIAC REHAB PHASE II ORIENTATION from 03/08/2016 in [facility] CARDIAC REHAB  Referring Provider  Donato Schultz MD      Initial Encounter Date:  Flowsheet Row CARDIAC REHAB PHASE II ORIENTATION from 03/08/2016 in [facility] CARDIAC REHAB  Date  03/08/16  Referring Provider  Donato Schultz MD      Visit Diagnosis: 12/22/15 NSTEMI (non-ST elevated myocardial infarction) [facility])  Status post coronary artery stent placement  Patient's Home Medications on Admission:  Current Outpatient Prescriptions:  .  amLODipine (NORVASC) 5 MG tablet, Take 1 tablet (5 mg total) by mouth daily., Disp: 90 tablet, Rfl: 3 .  aspirin EC 81 MG tablet, Take 1 tablet (81 mg total) by mouth daily., Disp: 1 tablet, Rfl: 0 .  atorvastatin (LIPITOR) 80 MG tablet, Take 1 tablet (80 mg total) by mouth daily at 6 PM., Disp: 90 tablet, Rfl: 3 .  carvedilol (COREG) 3.125 MG tablet, Take 1 tablet (3.125 mg total) by mouth 2 (two) times daily with a meal., Disp: 180 tablet, Rfl: 3 .  clopidogrel (PLAVIX) 75 MG tablet, Take 1 tablet (75 mg total) by mouth daily., Disp: 90 tablet, Rfl: 3 .  docusate sodium (COLACE) 100 MG capsule, Take 100 mg by mouth daily as needed for mild constipation., Disp: , Rfl:  .  insulin aspart protamine - aspart (NOVOLOG MIX 70/30 FLEXPEN) (70-30) 100 UNIT/ML FlexPen, Inject 0.15 mLs (15 Units total) into the skin 2 (two) times daily before a meal., Disp: 15 mL, Rfl: 0 .  lisinopril (PRINIVIL,ZESTRIL) 10 MG tablet, Take 1 tablet (10 mg total) by mouth daily., Disp: 90 tablet, Rfl: 3 .  metFORMIN (GLUCOPHAGE-XR) 500 MG 24 hr tablet, Take 1,000 mg by mouth 2 (two) times daily., Disp: , Rfl:  .  nitroGLYCERIN (NITROSTAT) 0.4 MG SL tablet, Place 0.4 mg under the tongue every 5 (five) minutes as needed for chest pain., Disp: ,  Rfl:  .  pantoprazole (PROTONIX) 40 MG tablet, Take 40 mg by mouth daily as needed. For heartburn, Disp: , Rfl:   Past Medical History: Past Medical History:  Diagnosis Date  . Diabetes (HCC)   . Diverticulosis   . GERD (gastroesophageal reflux disease)   . Heart attack 2011   2 stents in [facility]  . Heart murmur   . Hemorrhoids   . Hyperlipidemia due to type 2 diabetes mellitus (HCC)   . Hypertension   . Stented coronary artery 2011    Tobacco Use: History  Smoking Status  . Former Smoker  . Types: Cigarettes  . Quit date: 10/19/2002  Smokeless Tobacco  . Never Used    Labs: Recent Review Flowsheet Data    Labs for ITP Cardiac and Pulmonary Rehab Latest Ref Rng & Units 12/22/2015 12/23/2015   Cholestrol 0 - 200 mg/dL 469 629   LDLCALC 0 - 99 mg/dL 528(U) 132(G)   HDL >40 mg/dL 10(U) 72(Z)   Trlycerides <150 mg/dL 366(Y) 403   Hemoglobin A1c 4.8 - 5.6 % 12.4(H) -      Capillary Blood Glucose: Lab Results  Component Value Date   GLUCAP 185 (H) 04/30/2016   GLUCAP 253 (H) 04/30/2016   GLUCAP 201 (H) 04/25/2016   GLUCAP 218 (H) 04/23/2016   GLUCAP 158 (H) 04/11/2016     Exercise Target Goals:    Exercise Program Goal: Individual exercise  prescription set with THRR, safety & activity barriers. Participant demonstrates ability to understand and report RPE using BORG scale, to self-measure pulse accurately, and to acknowledge the importance of the exercise prescription.  Exercise Prescription Goal: Starting with aerobic activity 30 plus minutes a day, 3 days per week for initial exercise prescription. Provide home exercise prescription and guidelines that participant acknowledges understanding prior to discharge.  Activity Barriers & Risk Stratification:     Activity Barriers & Cardiac Risk Stratification - 03/08/16 0831      Activity Barriers & Cardiac Risk Stratification   Activity Barriers None   Cardiac Risk Stratification High      6 Minute Walk:      6 Minute Walk    Row Name 03/08/16 1100         6 Minute Walk   Phase Initial     Distance 2000 feet     Walk Time 6 minutes     # of Rest Breaks 0     MPH 3.8     METS 5.3     RPE 13     VO2 Peak 18.7     Symptoms No     Resting HR 70 bpm     Resting BP 158/80     Max Ex. HR 120 bpm     Max Ex. BP 182/88     2 Minute Post BP 136/74        Initial Exercise Prescription:     Initial Exercise Prescription - 03/08/16 1200      Date of Initial Exercise RX and Referring Provider   Date 03/08/16   Referring Provider Donato Schultz MD     Treadmill   MPH 3.2   Grade 1   Minutes 10   METs 3.89     Bike   Level 1   Minutes 10   METs 3.1     NuStep   Level 3   Minutes 10   METs 2     Prescription Details   Frequency (times per week) 3   Duration Progress to 30 minutes of continuous aerobic without signs/symptoms of physical distress     Intensity   THRR 40-80% of Max Heartrate 66-132   Ratings of Perceived Exertion 11-13     Progression   Progression Continue to progress workloads to maintain intensity without signs/symptoms of physical distress.     Resistance Training   Training Prescription Yes   Weight 3lbs   Reps 10-12      Perform Capillary Blood Glucose checks as needed.  Exercise Prescription Changes:     Exercise Prescription Changes    Row Name 03/22/16 1500 04/03/16 1000 05/01/16 1300         Exercise Review   Progression Yes Yes Yes       Response to Exercise   Blood Pressure (Admit) 126/78 158/80 122/82     Blood Pressure (Exercise) 158/104 170/90 164/90  recheck BP  146/82     Blood Pressure (Exit) 130/84 130/84  rchk BP 164/80 132/82     Heart Rate (Admit) 82 bpm 83 bpm 83 bpm     Heart Rate (Exercise) 133 bpm 134 bpm 134 bpm     Heart Rate (Exit) 89 bpm 83 bpm 83 bpm     Rating of Perceived Exertion (Exercise) 13 13 15      Symptoms none none none     Comments Reviewed HEP on 03/19/16 see progress note Reviewed HEP on  03/19/16 see  progress note Reviewed HEP on 03/19/16 see progress note     Duration Progress to 30 minutes of continuous aerobic without signs/symptoms of physical distress Progress to 30 minutes of continuous aerobic without signs/symptoms of physical distress Progress to 30 minutes of continuous aerobic without signs/symptoms of physical distress     Intensity THRR unchanged THRR unchanged THRR unchanged       Progression   Progression Continue progressive overload as per policy without signs/symptoms or physical distress. Continue progressive overload as per policy without signs/symptoms or physical distress. Continue progressive overload as per policy without signs/symptoms or physical distress.     Average METs 3.5 3.6 4.2       Resistance Training   Training Prescription Yes Yes Yes     Weight 3lbs 3lbs 6lbd     Reps 10-12 10-12 10-12       Treadmill   MPH 3.2 3.2 3.2     Grade 2 2 2      Minutes 10 10 10      METs 4.33 4.33 4.33       Bike   Level 1 1  -     Minutes 10 10  -     METs 3.13 314  -       NuStep   Level 3 3 5      Minutes 10 10 10      METs 2.9 3.4 3.8       Rower   Level  -  - 3     Watts  -  - 24     Minutes  -  - 10     METs  -  - 4.5       Home Exercise Plan   Plans to continue exercise at Home  Reviewed on 03/19/16 see progress note Home  Reviewed on 03/19/16 see progress note Home  Reviewed on 03/19/16 see progress note     Frequency Add 2 additional days to program exercise sessions. Add 2 additional days to program exercise sessions. Add 2 additional days to program exercise sessions.        Exercise Comments:     Exercise Comments    Row Name 03/22/16 1518 05/01/16 1344         Exercise Comments Reviewed METs and goals. Pt is tolerating exercise well; will continue to monitor exercise progression. Reviewed METs and goals. Pt is tolerating exercise well; will continue to monitor exercise progression.         Discharge Exercise  Prescription (Final Exercise Prescription Changes):     Exercise Prescription Changes - 05/01/16 1300      Exercise Review   Progression Yes     Response to Exercise   Blood Pressure (Admit) 122/82   Blood Pressure (Exercise) 164/90  recheck BP  146/82   Blood Pressure (Exit) 132/82   Heart Rate (Admit) 83 bpm   Heart Rate (Exercise) 134 bpm   Heart Rate (Exit) 83 bpm   Rating of Perceived Exertion (Exercise) 15   Symptoms none   Comments Reviewed HEP on 03/19/16 see progress note   Duration Progress to 30 minutes of continuous aerobic without signs/symptoms of physical distress   Intensity THRR unchanged     Progression   Progression Continue progressive overload as per policy without signs/symptoms or physical distress.   Average METs 4.2     Resistance Training   Training Prescription Yes   Weight 6lbd   Reps 10-12     Treadmill  MPH 3.2   Grade 2   Minutes 10   METs 4.33     NuStep   Level 5   Minutes 10   METs 3.8     Rower   Level 3   Watts 24   Minutes 10   METs 4.5     Home Exercise Plan   Plans to continue exercise at Home  Reviewed on 03/19/16 see progress note   Frequency Add 2 additional days to program exercise sessions.      Nutrition:  Target Goals: Understanding of nutrition guidelines, daily intake of sodium 1500mg , cholesterol 200mg , calories 30% from fat and 7% or less from saturated fats, daily to have 5 or more servings of fruits and vegetables.  Biometrics:     Pre Biometrics - 03/08/16 1119      Pre Biometrics   Waist Circumference 42 inches   Hip Circumference 40 inches   Waist to Hip Ratio 1.05 %   Triceps Skinfold 14 mm   % Body Fat 29.1 %   Grip Strength 41.5 kg   Flexibility 8 in   Single Leg Stand 30 seconds       Nutrition Therapy Plan and Nutrition Goals:     Nutrition Therapy & Goals - 03/12/16 1351      Nutrition Therapy   Diet Carb Modified, Therapeutic Lifestyle Changes     Personal Nutrition  Goals   Personal Goal #1 1-2 lb wt loss/week to a wt loss goal of 6-24 lb at graduation from Cardiac Rehab     Intervention Plan   Intervention Prescribe, educate and counsel regarding individualized specific dietary modifications aiming towards targeted core components such as weight, hypertension, lipid management, diabetes, heart failure and other comorbidities.   Expected Outcomes Short Term Goal: Understand basic principles of dietary content, such as calories, fat, sodium, cholesterol and nutrients.;Long Term Goal: Adherence to prescribed nutrition plan.      Nutrition Discharge: Nutrition Scores:   Nutrition Goals Re-Evaluation:   Psychosocial: Target Goals: Acknowledge presence or absence of depression, maximize coping skills, provide positive support system. Participant is able to verbalize types and ability to use techniques and skills needed for reducing stress and depression.  Initial Review & Psychosocial Screening:     Initial Psych Review & Screening - 03/08/16 1707      Family Dynamics   Good Support System? Yes   Comments Brief Psychosocial Assessment reveals no needs or further interventions.     Barriers   Psychosocial barriers to participate in program There are no identifiable barriers or psychosocial needs.      Quality of Life Scores:     Quality of Life - 03/08/16 1113      Quality of Life Scores   Health/Function Pre 26.67 %   Socioeconomic Pre 25.71 %   Psych/Spiritual Pre 27.14 %   Family Pre 28 %   GLOBAL Pre 26.76 %      PHQ-9: Recent Review Flowsheet Data    Depression screen [facility] 2/9 03/12/2016   Decreased Interest 0   Down, Depressed, Hopeless 0   PHQ - 2 Score 0      Psychosocial Evaluation and Intervention:     Psychosocial Evaluation - 03/12/16 0937      Psychosocial Evaluation & Interventions   Interventions Encouraged to exercise with the program and follow exercise prescription   Comments no psychosocial needs  identified, no intervention necessary    Continued Psychosocial Services Needed No  Psychosocial Re-Evaluation:     Psychosocial Re-Evaluation    Row Name 04/03/16 1558 04/30/16 0718           Psychosocial Re-Evaluation   Interventions Encouraged to attend Cardiac Rehabilitation for the exercise Encouraged to attend Cardiac Rehabilitation for the exercise      Comments no psychosocial needs identified, no interventions necessary.  no psychosocial needs identified, no interventions necessary. pt is walking for home exercise and is hoping to decrease his belly fat.        Continued Psychosocial Services Needed No No         Vocational Rehabilitation: Provide vocational rehab assistance to qualifying candidates.   Vocational Rehab Evaluation & Intervention:     Vocational Rehab - 03/08/16 1708      Initial Vocational Rehab Evaluation & Intervention   Assessment shows need for Vocational Rehabilitation No  Retired from the [employer]: Education Goals: Education classes will be provided on a weekly basis, covering required topics. Participant will state understanding/return demonstration of topics presented.  Learning Barriers/Preferences:     Learning Barriers/Preferences - 03/08/16 0831      Learning Barriers/Preferences   Learning Barriers Sight   Learning Preferences Pictoral;Computer/Internet;Video;Written Material      Education Topics: Count Your Pulse:  -Group instruction provided by verbal instruction, demonstration, patient participation and written materials to support subject.  Instructors address importance of being able to find your pulse and how to count your pulse when at home without a heart monitor.  Patients get hands on experience counting their pulse with staff help and individually.   Heart Attack, Angina, and Risk Factor Modification:  -Group instruction provided by verbal instruction, video, and written materials to support  subject.  Instructors address signs and symptoms of angina and heart attacks.    Also discuss risk factors for heart disease and how to make changes to improve heart health risk factors. Flowsheet Row CARDIAC REHAB PHASE II EXERCISE from 05/02/2016 in [facility] CARDIAC REHAB  Date  04/25/16  Instruction Review Code  2- meets goals/outcomes      Functional Fitness:  -Group instruction provided by verbal instruction, demonstration, patient participation, and written materials to support subject.  Instructors address safety measures for doing things around the house.  Discuss how to get up and down off the floor, how to pick things up properly, how to safely get out of a chair without assistance, and balance training.   Meditation and Mindfulness:  -Group instruction provided by verbal instruction, patient participation, and written materials to support subject.  Instructor addresses importance of mindfulness and meditation practice to help reduce stress and improve awareness.  Instructor also leads participants through a meditation exercise.    Stretching for Flexibility and Mobility:  -Group instruction provided by verbal instruction, patient participation, and written materials to support subject.  Instructors lead participants through series of stretches that are designed to increase flexibility thus improving mobility.  These stretches are additional exercise for major muscle groups that are typically performed during regular warm up and cool down.   Hands Only CPR Anytime:  -Group instruction provided by verbal instruction, video, patient participation and written materials to support subject.  Instructors co-teach with [facility] video for hands only CPR.  Participants get hands on experience with mannequins.   Nutrition I class: Heart Healthy Eating:  -Group instruction provided by PowerPoint slides, verbal discussion, and written materials to support subject matter. The  instructor gives an explanation  and review of the Therapeutic Lifestyle Changes diet recommendations, which includes a discussion on lipid goals, dietary fat, sodium, fiber, plant stanol/sterol esters, sugar, and the components of a well-balanced, healthy diet.   Nutrition II class: Lifestyle Skills:  -Group instruction provided by PowerPoint slides, verbal discussion, and written materials to support subject matter. The instructor gives an explanation and review of label reading, grocery shopping for heart health, heart healthy recipe modifications, and ways to make healthier choices when eating out.   Diabetes Question & Answer:  -Group instruction provided by PowerPoint slides, verbal discussion, and written materials to support subject matter. The instructor gives an explanation and review of diabetes co-morbidities, pre- and post-prandial blood glucose goals, pre-exercise blood glucose goals, signs, symptoms, and treatment of hypoglycemia and hyperglycemia, and foot care basics.   Diabetes Blitz:  -Group instruction provided by PowerPoint slides, verbal discussion, and written materials to support subject matter. The instructor gives an explanation and review of the physiology behind type 1 and type 2 diabetes, diabetes medications and rational behind using different medications, pre- and post-prandial blood glucose recommendations and Hemoglobin A1c goals, diabetes diet, and exercise including blood glucose guidelines for exercising safely.    Portion Distortion:  -Group instruction provided by PowerPoint slides, verbal discussion, written materials, and food models to support subject matter. The instructor gives an explanation of serving size versus portion size, changes in portions sizes over the last 20 years, and what consists of a serving from each food group.   Stress Management:  -Group instruction provided by verbal instruction, video, and written materials to support subject  matter.  Instructors review role of stress in heart disease and how to cope with stress positively.     Exercising on Your Own:  -Group instruction provided by verbal instruction, power point, and written materials to support subject.  Instructors discuss benefits of exercise, components of exercise, frequency and intensity of exercise, and end points for exercise.  Also discuss use of nitroglycerin and activating EMS.  Review options of places to exercise outside of rehab.  Review guidelines for sex with heart disease. Flowsheet Row CARDIAC REHAB PHASE II EXERCISE from 05/02/2016 in [facility] CARDIAC REHAB  Date  05/02/16  Instruction Review Code  2- meets goals/outcomes      Cardiac Drugs I:  -Group instruction provided by verbal instruction and written materials to support subject.  Instructor reviews cardiac drug classes: antiplatelets, anticoagulants, beta blockers, and statins.  Instructor discusses reasons, side effects, and lifestyle considerations for each drug class. Flowsheet Row CARDIAC REHAB PHASE II EXERCISE from 05/02/2016 in [facility] CARDIAC REHAB  Date  03/14/16  Instruction Review Code  2- meets goals/outcomes      Cardiac Drugs II:  -Group instruction provided by verbal instruction and written materials to support subject.  Instructor reviews cardiac drug classes: angiotensin converting enzyme inhibitors (ACE-I), angiotensin II receptor blockers (ARBs), nitrates, and calcium channel blockers.  Instructor discusses reasons, side effects, and lifestyle considerations for each drug class. Flowsheet Row CARDIAC REHAB PHASE II EXERCISE from 05/02/2016 in [facility] CARDIAC REHAB  Date  04/11/16  Instruction Review Code  2- meets goals/outcomes      Anatomy and Physiology of the Circulatory System:  -Group instruction provided by verbal instruction, video, and written materials to support subject.  Reviews functional  anatomy of heart, how it relates to various diagnoses, and what role the heart plays in the overall system. Flowsheet Row CARDIAC REHAB PHASE  II EXERCISE from 05/02/2016 in [facility] CARDIAC REHAB  Date  03/21/16  Instruction Review Code  2- meets goals/outcomes      Knowledge Questionnaire Score:     Knowledge Questionnaire Score - 03/08/16 1100      Knowledge Questionnaire Score   Pre Score 14/24      Core Components/Risk Factors/Patient Goals at Admission:     Personal Goals and Risk Factors at Admission - 03/08/16 0833      Core Components/Risk Factors/Patient Goals on Admission    Weight Management Yes;Weight Loss;Weight Maintenance   Intervention Weight Management: Develop a combined nutrition and exercise program designed to reach desired caloric intake, while maintaining appropriate intake of nutrient and fiber, sodium and fats, and appropriate energy expenditure required for the weight goal.;Weight Management: Provide education and appropriate resources to help participant work on and attain dietary goals.;Weight Management/Obesity: Establish reasonable short term and long term weight goals.;Obesity: Provide education and appropriate resources to help participant work on and attain dietary goals.   Expected Outcomes Short Term: Continue to assess and modify interventions until short term weight is achieved;Long Term: Adherence to nutrition and physical activity/exercise program aimed toward attainment of established weight goal;Weight Maintenance: Understanding of the daily nutrition guidelines, which includes 25-35% calories from fat, 7% or less cal from saturated fats, less than 200mg  cholesterol, less than 1.5gm of sodium, & 5 or more servings of fruits and vegetables daily;Weight Loss: Understanding of general recommendations for a balanced deficit meal plan, which promotes 1-2 lb weight loss per week and includes a negative energy balance of [phone]  kcal/d;Understanding recommendations for meals to include 15-35% energy as protein, 25-35% energy from fat, 35-60% energy from carbohydrates, less than 200mg  of dietary cholesterol, 20-35 gm of total fiber daily;Understanding of distribution of calorie intake throughout the day with the consumption of 4-5 meals/snacks   Sedentary Yes   Intervention Provide advice, education, support and counseling about physical activity/exercise needs.;Develop an individualized exercise prescription for aerobic and resistive training based on initial evaluation findings, risk stratification, comorbidities and participant's personal goals.   Expected Outcomes Achievement of increased cardiorespiratory fitness and enhanced flexibility, muscular endurance and strength shown through measurements of functional capacity and personal statement of participant.   Increase Strength and Stamina Yes   Intervention Provide advice, education, support and counseling about physical activity/exercise needs.;Develop an individualized exercise prescription for aerobic and resistive training based on initial evaluation findings, risk stratification, comorbidities and participant's personal goals.   Expected Outcomes Achievement of increased cardiorespiratory fitness and enhanced flexibility, muscular endurance and strength shown through measurements of functional capacity and personal statement of participant.   Diabetes Yes   Intervention Provide education about signs/symptoms and action to take for hypo/hyperglycemia.;Provide education about proper nutrition, including hydration, and aerobic/resistive exercise prescription along with prescribed medications to achieve blood glucose in normal ranges: Fasting glucose 65-99 mg/dL   Expected Outcomes Short Term: Participant verbalizes understanding of the signs/symptoms and immediate care of hyper/hypoglycemia, proper foot care and importance of medication, aerobic/resistive exercise and  nutrition plan for blood glucose control.;Long Term: Attainment of HbA1C < 7%.   Hypertension Yes   Intervention Provide education on lifestyle modifcations including regular physical activity/exercise, weight management, moderate sodium restriction and increased consumption of fresh fruit, vegetables, and low fat dairy, alcohol moderation, and smoking cessation.;Monitor prescription use compliance.   Expected Outcomes Short Term: Continued assessment and intervention until BP is < 140/2mm HG in hypertensive participants. < 130/14mm HG in hypertensive participants with  diabetes, heart failure or chronic kidney disease.;Long Term: Maintenance of blood pressure at goal levels.   Lipids Yes   Intervention Provide education and support for participant on nutrition & aerobic/resistive exercise along with prescribed medications to achieve LDL 70mg , HDL >40mg .   Expected Outcomes Short Term: Participant states understanding of desired cholesterol values and is compliant with medications prescribed. Participant is following exercise prescription and nutrition guidelines.;Long Term: Cholesterol controlled with medications as prescribed, with individualized exercise RX and with personalized nutrition plan. Value goals: LDL < 70mg , HDL > 40 mg.   Personal Goal Other Yes   Personal Goal short term: lose 5-10lbs and improve muscle mass  long term: goal weight 170lbs and pant size 36   Intervention Provide nutrition and exercise guidelines to assist improve cardiovascular fitness and losing weight   Expected Outcomes Pt will be able increase muscle mass and lose wt for a desired pant size of 36      Core Components/Risk Factors/Patient Goals Review:      Goals and Risk Factor Review    Row Name 03/08/16 1610 03/22/16 1518 05/01/16 1352         Core Components/Risk Factors/Patient Goals Review   Personal Goals Review Weight Management/Obesity;Sedentary;Increase Strength and Stamina Other Other     Review   - Pt is liking CRPII and finds it beneficial . Pt is walking at home 1x/week, encouaged walking another day for a total of 5 days of aerobic activiity. Pt is interested in core strengthening and losing wt in belly region. Discussed increasing WL  and weights. Oriented pt to core exercises. Also discussed nutrition and speaking with Marily Memos, nutritionist for dietary cjhanges.     Expected Outcomes  - Pt will be compliant with HEP and improve in cardiorespiratory fitness Pt will continue to get stronger and progress exercise tolerance. Pt will also lose wt in belly region by way of exercise/nutrition        Core Components/Risk Factors/Patient Goals at Discharge (Final Review):      Goals and Risk Factor Review - 05/01/16 1352      Core Components/Risk Factors/Patient Goals Review   Personal Goals Review Other   Review Pt is interested in core strengthening and losing wt in belly region. Discussed increasing WL  and weights. Oriented pt to core exercises. Also discussed nutrition and speaking with Marily Memos, nutritionist for dietary cjhanges.   Expected Outcomes Pt will continue to get stronger and progress exercise tolerance. Pt will also lose wt in belly region by way of exercise/nutrition      ITP Comments:     ITP Comments    Row Name 03/08/16 0829           ITP Comments Dr. Armanda Magic, Medical Director          Comments: Pt is making expected progress toward personal goals after completing 12 sessions. Recommend continued exercise and life style modification education including  stress management and relaxation techniques to decrease cardiac risk profile.

## 2016-05-03 LAB — GLUCOSE, CAPILLARY
GLUCOSE-CAPILLARY: 185 mg/dL — AB (ref 65–99)
GLUCOSE-CAPILLARY: 87 mg/dL (ref 65–99)

## 2016-05-04 ENCOUNTER — Ambulatory Visit (HOSPITAL_COMMUNITY): Payer: Non-veteran care

## 2016-05-04 ENCOUNTER — Encounter (HOSPITAL_COMMUNITY): Payer: Non-veteran care

## 2016-05-07 ENCOUNTER — Encounter (HOSPITAL_COMMUNITY): Admission: RE | Admit: 2016-05-07 | Payer: Non-veteran care | Source: Ambulatory Visit

## 2016-05-09 ENCOUNTER — Encounter (HOSPITAL_COMMUNITY)
Admission: RE | Admit: 2016-05-09 | Discharge: 2016-05-09 | Disposition: A | Discharge status: Home / Self Care | Payer: Non-veteran care | Source: Ambulatory Visit | Attending: Cardiology | Admitting: Cardiology

## 2016-05-09 DIAGNOSIS — Z955 Presence of coronary angioplasty implant and graft: Secondary | ICD-10-CM | POA: Insufficient documentation

## 2016-05-09 DIAGNOSIS — I214 Non-ST elevation (NSTEMI) myocardial infarction: Secondary | ICD-10-CM | POA: Diagnosis not present

## 2016-05-09 LAB — GLUCOSE, CAPILLARY: GLUCOSE-CAPILLARY: 157 mg/dL — AB (ref 65–99)

## 2016-05-11 ENCOUNTER — Ambulatory Visit (HOSPITAL_COMMUNITY): Payer: Non-veteran care

## 2016-05-11 ENCOUNTER — Encounter (HOSPITAL_COMMUNITY): Payer: Non-veteran care

## 2016-05-14 ENCOUNTER — Encounter (HOSPITAL_COMMUNITY)
Admission: RE | Admit: 2016-05-14 | Discharge: 2016-05-14 | Disposition: A | Discharge status: Home / Self Care | Payer: Non-veteran care | Source: Ambulatory Visit

## 2016-05-14 ENCOUNTER — Encounter (HOSPITAL_COMMUNITY): Payer: Non-veteran care

## 2016-05-14 DIAGNOSIS — I214 Non-ST elevation (NSTEMI) myocardial infarction: Secondary | ICD-10-CM | POA: Diagnosis not present

## 2016-05-15 NOTE — Progress Notes (Signed)
Pt arrived at cardiac rehab on 05/14/16 however was not  able to exercise due emotional distress.  Pt received disturbing news over the weekend that has caused him stress and anxiety. Pt had 1:1 consultation with RN.   Pt offered reassurance and emotional support.   Pt brought home glucometer for evaluation. Pt CBG tested with hospital monitor and home monitor for comparision.

## 2016-05-16 ENCOUNTER — Encounter (HOSPITAL_COMMUNITY): Payer: Non-veteran care

## 2016-05-16 ENCOUNTER — Telehealth (HOSPITAL_COMMUNITY): Payer: Self-pay

## 2016-05-16 LAB — GLUCOSE, CAPILLARY: Glucose-Capillary: 185 mg/dL — ABNORMAL HIGH (ref 65–99)

## 2016-05-16 NOTE — Telephone Encounter (Signed)
Pt lft msg cancelled class due to being out of town... KJ

## 2016-05-18 ENCOUNTER — Telehealth (HOSPITAL_COMMUNITY): Payer: Self-pay | Admitting: Cardiac Rehabilitation

## 2016-05-18 ENCOUNTER — Ambulatory Visit (HOSPITAL_COMMUNITY): Payer: Non-veteran care

## 2016-05-18 ENCOUNTER — Encounter (HOSPITAL_COMMUNITY): Payer: Non-veteran care

## 2016-05-18 NOTE — Telephone Encounter (Signed)
pc to pt to discuss reason for continued absence from cardiac rehab.  Cell phone wrong number.  Home phone no answering machine.

## 2016-05-21 ENCOUNTER — Encounter (HOSPITAL_COMMUNITY): Admission: RE | Admit: 2016-05-21 | Payer: Non-veteran care | Source: Ambulatory Visit

## 2016-05-23 ENCOUNTER — Encounter (HOSPITAL_COMMUNITY): Payer: Non-veteran care

## 2016-05-25 ENCOUNTER — Ambulatory Visit (HOSPITAL_COMMUNITY): Payer: Non-veteran care

## 2016-05-25 ENCOUNTER — Encounter (HOSPITAL_COMMUNITY): Payer: Non-veteran care

## 2016-05-28 ENCOUNTER — Encounter (HOSPITAL_COMMUNITY): Payer: Non-veteran care

## 2016-05-29 ENCOUNTER — Telehealth (HOSPITAL_COMMUNITY): Payer: Self-pay | Admitting: Cardiac Rehabilitation

## 2016-05-29 NOTE — Telephone Encounter (Signed)
pc to pt to assess reason for continued absence from cardiac rehab. LMOM 

## 2016-05-30 ENCOUNTER — Encounter (HOSPITAL_COMMUNITY): Admission: RE | Admit: 2016-05-30 | Payer: Non-veteran care | Source: Ambulatory Visit

## 2016-05-30 NOTE — Progress Notes (Signed)
Cardiac Individual Treatment Plan  Patient Details  Name: Antonio [name] MRN: [id number] Date of Birth: 07/25/1960 Referring Provider:   Flowsheet Row CARDIAC REHAB PHASE II ORIENTATION from 03/08/2016 in [facility] CARDIAC REHAB  Referring Provider  Donato Schultz MD      Initial Encounter Date:  Flowsheet Row CARDIAC REHAB PHASE II ORIENTATION from 03/08/2016 in [facility] CARDIAC REHAB  Date  03/08/16  Referring Provider  Donato Schultz MD      Visit Diagnosis: No diagnosis found.  Patient's Home Medications on Admission:  Current Outpatient Prescriptions:  .  amLODipine (NORVASC) 5 MG tablet, Take 1 tablet (5 mg total) by mouth daily., Disp: 90 tablet, Rfl: 3 .  aspirin EC 81 MG tablet, Take 1 tablet (81 mg total) by mouth daily., Disp: 1 tablet, Rfl: 0 .  atorvastatin (LIPITOR) 80 MG tablet, Take 1 tablet (80 mg total) by mouth daily at 6 PM., Disp: 90 tablet, Rfl: 3 .  carvedilol (COREG) 3.125 MG tablet, Take 1 tablet (3.125 mg total) by mouth 2 (two) times daily with a meal., Disp: 180 tablet, Rfl: 3 .  clopidogrel (PLAVIX) 75 MG tablet, Take 1 tablet (75 mg total) by mouth daily., Disp: 90 tablet, Rfl: 3 .  docusate sodium (COLACE) 100 MG capsule, Take 100 mg by mouth daily as needed for mild constipation., Disp: , Rfl:  .  insulin aspart protamine - aspart (NOVOLOG MIX 70/30 FLEXPEN) (70-30) 100 UNIT/ML FlexPen, Inject 0.15 mLs (15 Units total) into the skin 2 (two) times daily before a meal., Disp: 15 mL, Rfl: 0 .  lisinopril (PRINIVIL,ZESTRIL) 10 MG tablet, Take 1 tablet (10 mg total) by mouth daily., Disp: 90 tablet, Rfl: 3 .  metFORMIN (GLUCOPHAGE-XR) 500 MG 24 hr tablet, Take 1,000 mg by mouth 2 (two) times daily., Disp: , Rfl:  .  nitroGLYCERIN (NITROSTAT) 0.4 MG SL tablet, Place 0.4 mg under the tongue every 5 (five) minutes as needed for chest pain., Disp: , Rfl:  .  pantoprazole (PROTONIX) 40 MG tablet, Take 40 mg by mouth daily as needed.  For heartburn, Disp: , Rfl:   Past Medical History: Past Medical History:  Diagnosis Date  . Diabetes (HCC)   . Diverticulosis   . GERD (gastroesophageal reflux disease)   . Heart attack 2011   2 stents in [facility]  . Heart murmur   . Hemorrhoids   . Hyperlipidemia due to type 2 diabetes mellitus (HCC)   . Hypertension   . Stented coronary artery 2011    Tobacco Use: History  Smoking Status  . Former Smoker  . Types: Cigarettes  . Quit date: 10/19/2002  Smokeless Tobacco  . Never Used    Labs: Recent Review Flowsheet Data    Labs for ITP Cardiac and Pulmonary Rehab Latest Ref Rng & Units 12/22/2015 12/23/2015   Cholestrol 0 - 200 mg/dL 013 143   LDLCALC 0 - 99 mg/dL 888(L) 579(J)   HDL >28 mg/dL 20(U) 01(V)   Trlycerides <150 mg/dL 615(P) 794   Hemoglobin A1c 4.8 - 5.6 % 12.4(H) -      Capillary Blood Glucose: Lab Results  Component Value Date   GLUCAP 185 (H) 05/14/2016   GLUCAP 157 (H) 05/09/2016   GLUCAP 87 05/02/2016   GLUCAP 185 (H) 05/02/2016   GLUCAP 185 (H) 04/30/2016     Exercise Target Goals:    Exercise Program Goal: Individual exercise prescription set with THRR, safety & activity barriers. Participant demonstrates ability to  understand and report RPE using BORG scale, to self-measure pulse accurately, and to acknowledge the importance of the exercise prescription.  Exercise Prescription Goal: Starting with aerobic activity 30 plus minutes a day, 3 days per week for initial exercise prescription. Provide home exercise prescription and guidelines that participant acknowledges understanding prior to discharge.  Activity Barriers & Risk Stratification:     Activity Barriers & Cardiac Risk Stratification - 03/08/16 0831      Activity Barriers & Cardiac Risk Stratification   Activity Barriers None   Cardiac Risk Stratification High      6 Minute Walk:     6 Minute Walk    Row Name 03/08/16 1100         6 Minute Walk   Phase Initial      Distance 2000 feet     Walk Time 6 minutes     # of Rest Breaks 0     MPH 3.8     METS 5.3     RPE 13     VO2 Peak 18.7     Symptoms No     Resting HR 70 bpm     Resting BP 158/80     Max Ex. HR 120 bpm     Max Ex. BP 182/88     2 Minute Post BP 136/74        Oxygen Initial Assessment:   Oxygen Re-Evaluation:   Oxygen Discharge (Final Oxygen Re-Evaluation):   Initial Exercise Prescription:     Initial Exercise Prescription - 03/08/16 1200      Date of Initial Exercise RX and Referring Provider   Date 03/08/16   Referring Provider Donato Schultz MD     Treadmill   MPH 3.2   Grade 1   Minutes 10   METs 3.89     Bike   Level 1   Minutes 10   METs 3.1     NuStep   Level 3   Minutes 10   METs 2     Prescription Details   Frequency (times per week) 3   Duration Progress to 30 minutes of continuous aerobic without signs/symptoms of physical distress     Intensity   THRR 40-80% of Max Heartrate 66-132   Ratings of Perceived Exertion 11-13     Progression   Progression Continue to progress workloads to maintain intensity without signs/symptoms of physical distress.     Resistance Training   Training Prescription Yes   Weight 3lbs   Reps 10-12      Perform Capillary Blood Glucose checks as needed.  Exercise Prescription Changes:     Exercise Prescription Changes    Row Name 03/22/16 1500 04/03/16 1000 05/01/16 1300 05/09/16 1626       Response to Exercise   Blood Pressure (Admit) 126/78 158/80 122/82 140/90    Blood Pressure (Exercise) 158/104 170/90 164/90  recheck BP  146/82 160/82    Blood Pressure (Exit) 130/84 130/84  rchk BP 164/80 132/82 142/82    Heart Rate (Admit) 82 bpm 83 bpm 83 bpm 80 bpm    Heart Rate (Exercise) 133 bpm 134 bpm 134 bpm 101 bpm    Heart Rate (Exit) 89 bpm 83 bpm 83 bpm 75 bpm    Rating of Perceived Exertion (Exercise) 13 13 15 13     Symptoms none none none none    Comments Reviewed HEP on 03/19/16 see progress  note Reviewed HEP on 03/19/16 see progress note Reviewed HEP on 03/19/16 see  progress note Pt does core training exercises for 10 minutes first station    Duration Progress to 30 minutes of continuous aerobic without signs/symptoms of physical distress Progress to 30 minutes of continuous aerobic without signs/symptoms of physical distress Progress to 30 minutes of continuous aerobic without signs/symptoms of physical distress Continue with 30 min of aerobic exercise without signs/symptoms of physical distress.    Intensity THRR unchanged THRR unchanged THRR unchanged THRR unchanged      Progression   Progression Continue progressive overload as per policy without signs/symptoms or physical distress. Continue progressive overload as per policy without signs/symptoms or physical distress. Continue progressive overload as per policy without signs/symptoms or physical distress. Continue to progress workloads to maintain intensity without signs/symptoms of physical distress.    Average METs 3.5 3.6 4.2 4.3      Resistance Training   Training Prescription Yes Yes Yes Yes    Weight 3lbs 3lbs 6lbd 6lbs    Reps 10-12 10-12 10-12 10-15    Time  -  -  - 10 Minutes      Treadmill   MPH 3.2 3.2 3.2  -    Grade 2 2 2   -    Minutes 10 10 10   -    METs 4.33 4.33 4.33  -      Bike   Level 1 1  -  -    Minutes 10 10  -  -    METs 3.13 314  -  -      NuStep   Level 3 3 5 5     Minutes 10 10 10 10     METs 2.9 3.4 3.8 3.9      Rower   Level  -  - 3 4    Watts  -  - 24 33    Minutes  -  - 10 10    METs  -  - 4.5 4.8      Home Exercise Plan   Plans to continue exercise at Home  Reviewed on 03/19/16 see progress note Home  Reviewed on 03/19/16 see progress note Home  Reviewed on 03/19/16 see progress note Home (comment)    Frequency Add 2 additional days to program exercise sessions. Add 2 additional days to program exercise sessions. Add 2 additional days to program exercise sessions. Add 2  additional days to program exercise sessions.    Initial Home Exercises Provided  -  -  - 03/19/16      Exercise Review   Progression Yes Yes Yes  -       Exercise Comments:     Exercise Comments    Row Name 03/22/16 1518 05/01/16 1344 05/30/16 1635       Exercise Comments Reviewed METs and goals. Pt is tolerating exercise well; will continue to monitor exercise progression. Reviewed METs and goals. Pt is tolerating exercise well; will continue to monitor exercise progression. Pt has been absent from cardiac rehab for ~3 weeks. Pt plans to return in March. Will f/u with pt's progress upon return.        Exercise Goals and Review:     Exercise Goals    Row Name 05/30/16 1633             Exercise Goals   Increase Physical Activity Yes       Intervention Provide advice, education, support and counseling about physical activity/exercise needs.;Develop an individualized exercise prescription for aerobic and resistive training based on initial evaluation findings,  risk stratification, comorbidities and participant's personal goals.       Expected Outcomes Achievement of increased cardiorespiratory fitness and enhanced flexibility, muscular endurance and strength shown through measurements of functional capacity and personal statement of participant.       Increase Strength and Stamina Yes       Intervention Provide advice, education, support and counseling about physical activity/exercise needs.;Develop an individualized exercise prescription for aerobic and resistive training based on initial evaluation findings, risk stratification, comorbidities and participant's personal goals.       Expected Outcomes Achievement of increased cardiorespiratory fitness and enhanced flexibility, muscular endurance and strength shown through measurements of functional capacity and personal statement of participant.          Exercise Goals Re-Evaluation :     Exercise Goals Re-Evaluation    Row  Name 05/30/16 1634             Exercise Goal Re-Evaluation   Exercise Goals Review Increase Physical Activity;Increase Strenth and Stamina       Comments Pt has increased Watts and METs on the rower machine and has been introduced to core training as apart of exercise program.       Expected Outcomes Pt will continue with improving in aerobic fitness and gain abnominal strength and flatten belly region.           Discharge Exercise Prescription (Final Exercise Prescription Changes):     Exercise Prescription Changes - 05/09/16 1626      Response to Exercise   Blood Pressure (Admit) 140/90   Blood Pressure (Exercise) 160/82   Blood Pressure (Exit) 142/82   Heart Rate (Admit) 80 bpm   Heart Rate (Exercise) 101 bpm   Heart Rate (Exit) 75 bpm   Rating of Perceived Exertion (Exercise) 13   Symptoms none   Comments Pt does core training exercises for 10 minutes first station   Duration Continue with 30 min of aerobic exercise without signs/symptoms of physical distress.   Intensity THRR unchanged     Progression   Progression Continue to progress workloads to maintain intensity without signs/symptoms of physical distress.   Average METs 4.3     Resistance Training   Training Prescription Yes   Weight 6lbs   Reps 10-15   Time 10 Minutes     NuStep   Level 5   Minutes 10   METs 3.9     Rower   Level 4   Watts 33   Minutes 10   METs 4.8     Home Exercise Plan   Plans to continue exercise at Home (comment)   Frequency Add 2 additional days to program exercise sessions.   Initial Home Exercises Provided 03/19/16      Nutrition:  Target Goals: Understanding of nutrition guidelines, daily intake of sodium 1500mg , cholesterol 200mg , calories 30% from fat and 7% or less from saturated fats, daily to have 5 or more servings of fruits and vegetables.  Biometrics:     Pre Biometrics - 03/08/16 1119      Pre Biometrics   Waist Circumference 42 inches   Hip  Circumference 40 inches   Waist to Hip Ratio 1.05 %   Triceps Skinfold 14 mm   % Body Fat 29.1 %   Grip Strength 41.5 kg   Flexibility 8 in   Single Leg Stand 30 seconds       Nutrition Therapy Plan and Nutrition Goals:     Nutrition Therapy & Goals - 03/12/16 1351  Nutrition Therapy   Diet Carb Modified, Therapeutic Lifestyle Changes     Personal Nutrition Goals   Nutrition Goal 1-2 lb wt loss/week to a wt loss goal of 6-24 lb at graduation from Cardiac Rehab     Intervention Plan   Intervention Prescribe, educate and counsel regarding individualized specific dietary modifications aiming towards targeted core components such as weight, hypertension, lipid management, diabetes, heart failure and other comorbidities.   Expected Outcomes Short Term Goal: Understand basic principles of dietary content, such as calories, fat, sodium, cholesterol and nutrients.;Long Term Goal: Adherence to prescribed nutrition plan.      Nutrition Discharge: Nutrition Scores:   Nutrition Goals Re-Evaluation:   Nutrition Goals Re-Evaluation:   Nutrition Goals Discharge (Final Nutrition Goals Re-Evaluation):   Psychosocial: Target Goals: Acknowledge presence or absence of significant depression and/or stress, maximize coping skills, provide positive support system. Participant is able to verbalize types and ability to use techniques and skills needed for reducing stress and depression.  Initial Review & Psychosocial Screening:     Initial Psych Review & Screening - 03/08/16 1707      Family Dynamics   Good Support System? Yes   Comments Brief Psychosocial Assessment reveals no needs or further interventions.     Barriers   Psychosocial barriers to participate in program There are no identifiable barriers or psychosocial needs.      Quality of Life Scores:     Quality of Life - 03/08/16 1113      Quality of Life Scores   Health/Function Pre 26.67 %   Socioeconomic Pre 25.71  %   Psych/Spiritual Pre 27.14 %   Family Pre 28 %   GLOBAL Pre 26.76 %      PHQ-9: Recent Review Flowsheet Data    Depression screen [facility] 2/9 03/12/2016   Decreased Interest 0   Down, Depressed, Hopeless 0   PHQ - 2 Score 0     Interpretation of Total Score  Total Score Depression Severity:  1-4 = Minimal depression, 5-9 = Mild depression, 10-14 = Moderate depression, 15-19 = Moderately severe depression, 20-27 = Severe depression   Psychosocial Evaluation and Intervention:     Psychosocial Evaluation - 03/12/16 0937      Psychosocial Evaluation & Interventions   Interventions Encouraged to exercise with the program and follow exercise prescription   Comments no psychosocial needs identified, no intervention necessary    Continue Psychosocial Services  No      Psychosocial Re-Evaluation:     Psychosocial Re-Evaluation    Row Name 04/03/16 1558 04/30/16 0718 05/30/16 1008         Psychosocial Re-Evaluation   Current issues with  -  - Current Stress Concerns;Current Anxiety/Panic     Comments no psychosocial needs identified, no interventions necessary.  no psychosocial needs identified, no interventions necessary. pt is walking for home exercise and is hoping to decrease his belly fat.   family conflict     Expected Outcomes  -  - pt will establish healthy coping skills to re-establish positive, hopeful outlook.       Interventions Encouraged to attend Cardiac Rehabilitation for the exercise Encouraged to attend Cardiac Rehabilitation for the exercise Stress management education;Relaxation education;Encouraged to attend Cardiac Rehabilitation for the exercise  pt is traveling to [place], [place] for assistance with his stress management and family conlict. pt is expected to be absent from cardiac rehab for approximately 1 week for this time dedicated to healing and reconciliation.  Continue Psychosocial Services  No No Follow up required by staff       Initial Review    Source of Stress Concerns  -  - Family;Poor Coping Skills        Psychosocial Discharge (Final Psychosocial Re-Evaluation):     Psychosocial Re-Evaluation - 05/30/16 1008      Psychosocial Re-Evaluation   Current issues with Current Stress Concerns;Current Anxiety/Panic   Comments family conflict   Expected Outcomes pt will establish healthy coping skills to re-establish positive, hopeful outlook.     Interventions Stress management education;Relaxation education;Encouraged to attend Cardiac Rehabilitation for the exercise  pt is traveling to [place], [place] for assistance with his stress management and family conlict. pt is expected to be absent from cardiac rehab for approximately 1 week for this time dedicated to healing and reconciliation.    Continue Psychosocial Services  Follow up required by staff     Initial Review   Source of Stress Concerns Family;Poor Coping Skills      Vocational Rehabilitation: Provide vocational rehab assistance to qualifying candidates.   Vocational Rehab Evaluation & Intervention:     Vocational Rehab - 03/08/16 1708      Initial Vocational Rehab Evaluation & Intervention   Assessment shows need for Vocational Rehabilitation No  Retired from the [employer]: Education Goals: Education classes will be provided on a weekly basis, covering required topics. Participant will state understanding/return demonstration of topics presented.  Learning Barriers/Preferences:     Learning Barriers/Preferences - 03/08/16 0831      Learning Barriers/Preferences   Learning Barriers Sight   Learning Preferences Pictoral;Computer/Internet;Video;Written Material      Education Topics: Count Your Pulse:  -Group instruction provided by verbal instruction, demonstration, patient participation and written materials to support subject.  Instructors address importance of being able to find your pulse and how to count your pulse when at home  without a heart monitor.  Patients get hands on experience counting their pulse with staff help and individually.   Heart Attack, Angina, and Risk Factor Modification:  -Group instruction provided by verbal instruction, video, and written materials to support subject.  Instructors address signs and symptoms of angina and heart attacks.    Also discuss risk factors for heart disease and how to make changes to improve heart health risk factors. Flowsheet Row CARDIAC REHAB PHASE II EXERCISE from 05/09/2016 in [facility] CARDIAC REHAB  Date  04/25/16  Instruction Review Code  2- meets goals/outcomes      Functional Fitness:  -Group instruction provided by verbal instruction, demonstration, patient participation, and written materials to support subject.  Instructors address safety measures for doing things around the house.  Discuss how to get up and down off the floor, how to pick things up properly, how to safely get out of a chair without assistance, and balance training.   Meditation and Mindfulness:  -Group instruction provided by verbal instruction, patient participation, and written materials to support subject.  Instructor addresses importance of mindfulness and meditation practice to help reduce stress and improve awareness.  Instructor also leads participants through a meditation exercise.    Stretching for Flexibility and Mobility:  -Group instruction provided by verbal instruction, patient participation, and written materials to support subject.  Instructors lead participants through series of stretches that are designed to increase flexibility thus improving mobility.  These stretches are additional exercise for major muscle groups that are typically performed during regular warm up and cool  down.   Hands Only CPR Anytime:  -Group instruction provided by verbal instruction, video, patient participation and written materials to support subject.  Instructors co-teach  with AHA video for hands only CPR.  Participants get hands on experience with mannequins.   Nutrition I class: Heart Healthy Eating:  -Group instruction provided by PowerPoint slides, verbal discussion, and written materials to support subject matter. The instructor gives an explanation and review of the Therapeutic Lifestyle Changes diet recommendations, which includes a discussion on lipid goals, dietary fat, sodium, fiber, plant stanol/sterol esters, sugar, and the components of a well-balanced, healthy diet.   Nutrition II class: Lifestyle Skills:  -Group instruction provided by PowerPoint slides, verbal discussion, and written materials to support subject matter. The instructor gives an explanation and review of label reading, grocery shopping for heart health, heart healthy recipe modifications, and ways to make healthier choices when eating out.   Diabetes Question & Answer:  -Group instruction provided by PowerPoint slides, verbal discussion, and written materials to support subject matter. The instructor gives an explanation and review of diabetes co-morbidities, pre- and post-prandial blood glucose goals, pre-exercise blood glucose goals, signs, symptoms, and treatment of hypoglycemia and hyperglycemia, and foot care basics.   Diabetes Blitz:  -Group instruction provided by PowerPoint slides, verbal discussion, and written materials to support subject matter. The instructor gives an explanation and review of the physiology behind type 1 and type 2 diabetes, diabetes medications and rational behind using different medications, pre- and post-prandial blood glucose recommendations and Hemoglobin A1c goals, diabetes diet, and exercise including blood glucose guidelines for exercising safely.    Portion Distortion:  -Group instruction provided by PowerPoint slides, verbal discussion, written materials, and food models to support subject matter. The instructor gives an explanation of serving  size versus portion size, changes in portions sizes over the last 20 years, and what consists of a serving from each food group.   Stress Management:  -Group instruction provided by verbal instruction, video, and written materials to support subject matter.  Instructors review role of stress in heart disease and how to cope with stress positively.     Exercising on Your Own:  -Group instruction provided by verbal instruction, power point, and written materials to support subject.  Instructors discuss benefits of exercise, components of exercise, frequency and intensity of exercise, and end points for exercise.  Also discuss use of nitroglycerin and activating EMS.  Review options of places to exercise outside of rehab.  Review guidelines for sex with heart disease. Flowsheet Row CARDIAC REHAB PHASE II EXERCISE from 05/09/2016 in [facility] CARDIAC REHAB  Date  05/02/16  Instruction Review Code  2- meets goals/outcomes      Cardiac Drugs I:  -Group instruction provided by verbal instruction and written materials to support subject.  Instructor reviews cardiac drug classes: antiplatelets, anticoagulants, beta blockers, and statins.  Instructor discusses reasons, side effects, and lifestyle considerations for each drug class. Flowsheet Row CARDIAC REHAB PHASE II EXERCISE from 05/09/2016 in [facility] CARDIAC REHAB  Date  03/14/16  Instruction Review Code  2- meets goals/outcomes      Cardiac Drugs II:  -Group instruction provided by verbal instruction and written materials to support subject.  Instructor reviews cardiac drug classes: angiotensin converting enzyme inhibitors (ACE-I), angiotensin II receptor blockers (ARBs), nitrates, and calcium channel blockers.  Instructor discusses reasons, side effects, and lifestyle considerations for each drug class. Flowsheet Row CARDIAC REHAB PHASE II EXERCISE from 05/09/2016 in [facility]AC REH[facility]   Date  04/11/16  Instruction Review Code  2- meets goals/outcomes      Anatomy and Physiology of the Circulatory System:  -Group instruction provided by verbal instruction, video, and written materials to support subject.  Reviews functional anatomy of heart, how it relates to various diagnoses, and what role the heart plays in the overall system. Flowsheet Row CARDIAC REHAB PHASE II EXERCISE from 05/09/2016 in [facility] CARDIAC REHAB  Date  05/09/16  Instruction Review Code  2- meets goals/outcomes      Knowledge Questionnaire Score:     Knowledge Questionnaire Score - 03/08/16 1100      Knowledge Questionnaire Score   Pre Score 14/24      Core Components/Risk Factors/Patient Goals at Admission:     Personal Goals and Risk Factors at Admission - 03/08/16 0833      Core Components/Risk Factors/Patient Goals on Admission    Weight Management Yes;Weight Loss;Weight Maintenance   Intervention Weight Management: Develop a combined nutrition and exercise program designed to reach desired caloric intake, while maintaining appropriate intake of nutrient and fiber, sodium and fats, and appropriate energy expenditure required for the weight goal.;Weight Management: Provide education and appropriate resources to help participant work on and attain dietary goals.;Weight Management/Obesity: Establish reasonable short term and long term weight goals.;Obesity: Provide education and appropriate resources to help participant work on and attain dietary goals.   Expected Outcomes Short Term: Continue to assess and modify interventions until short term weight is achieved;Long Term: Adherence to nutrition and physical activity/exercise program aimed toward attainment of established weight goal;Weight Maintenance: Understanding of the daily nutrition guidelines, which includes 25-35% calories from fat, 7% or less cal from saturated fats, less than 200mg  cholesterol, less than 1.5gm of  sodium, & 5 or more servings of fruits and vegetables daily;Weight Loss: Understanding of general recommendations for a balanced deficit meal plan, which promotes 1-2 lb weight loss per week and includes a negative energy balance of [phone] kcal/d;Understanding recommendations for meals to include 15-35% energy as protein, 25-35% energy from fat, 35-60% energy from carbohydrates, less than 200mg  of dietary cholesterol, 20-35 gm of total fiber daily;Understanding of distribution of calorie intake throughout the day with the consumption of 4-5 meals/snacks   Sedentary Yes   Intervention Provide advice, education, support and counseling about physical activity/exercise needs.;Develop an individualized exercise prescription for aerobic and resistive training based on initial evaluation findings, risk stratification, comorbidities and participant's personal goals.   Expected Outcomes Achievement of increased cardiorespiratory fitness and enhanced flexibility, muscular endurance and strength shown through measurements of functional capacity and personal statement of participant.   Increase Strength and Stamina Yes   Intervention Provide advice, education, support and counseling about physical activity/exercise needs.;Develop an individualized exercise prescription for aerobic and resistive training based on initial evaluation findings, risk stratification, comorbidities and participant's personal goals.   Expected Outcomes Achievement of increased cardiorespiratory fitness and enhanced flexibility, muscular endurance and strength shown through measurements of functional capacity and personal statement of participant.   Diabetes Yes   Intervention Provide education about signs/symptoms and action to take for hypo/hyperglycemia.;Provide education about proper nutrition, including hydration, and aerobic/resistive exercise prescription along with prescribed medications to achieve blood glucose in normal ranges:  Fasting glucose 65-99 mg/dL   Expected Outcomes Short Term: Participant verbalizes understanding of the signs/symptoms and immediate care of hyper/hypoglycemia, proper foot care and importance of medication, aerobic/resistive exercise and nutrition plan for blood glucose control.;Long Term:  Attainment of HbA1C < 7%.   Hypertension Yes   Intervention Provide education on lifestyle modifcations including regular physical activity/exercise, weight management, moderate sodium restriction and increased consumption of fresh fruit, vegetables, and low fat dairy, alcohol moderation, and smoking cessation.;Monitor prescription use compliance.   Expected Outcomes Short Term: Continued assessment and intervention until BP is < 140/36mm HG in hypertensive participants. < 130/35mm HG in hypertensive participants with diabetes, heart failure or chronic kidney disease.;Long Term: Maintenance of blood pressure at goal levels.   Lipids Yes   Intervention Provide education and support for participant on nutrition & aerobic/resistive exercise along with prescribed medications to achieve LDL 70mg , HDL >40mg .   Expected Outcomes Short Term: Participant states understanding of desired cholesterol values and is compliant with medications prescribed. Participant is following exercise prescription and nutrition guidelines.;Long Term: Cholesterol controlled with medications as prescribed, with individualized exercise RX and with personalized nutrition plan. Value goals: LDL < 70mg , HDL > 40 mg.   Personal Goal Other Yes   Personal Goal short term: lose 5-10lbs and improve muscle mass  long term: goal weight 170lbs and pant size 36   Intervention Provide nutrition and exercise guidelines to assist improve cardiovascular fitness and losing weight   Expected Outcomes Pt will be able increase muscle mass and lose wt for a desired pant size of 36      Core Components/Risk Factors/Patient Goals Review:      Goals and Risk Factor  Review    Row Name 03/08/16 1610 03/22/16 1518 05/01/16 1352         Core Components/Risk Factors/Patient Goals Review   Personal Goals Review Weight Management/Obesity;Sedentary;Increase Strength and Stamina Other Other     Review  - Pt is liking CRPII and finds it beneficial . Pt is walking at home 1x/week, encouaged walking another day for a total of 5 days of aerobic activiity. Pt is interested in core strengthening and losing wt in belly region. Discussed increasing WL  and weights. Oriented pt to core exercises. Also discussed nutrition and speaking with Marily Memos, nutritionist for dietary cjhanges.     Expected Outcomes  - Pt will be compliant with HEP and improve in cardiorespiratory fitness Pt will continue to get stronger and progress exercise tolerance. Pt will also lose wt in belly region by way of exercise/nutrition        Core Components/Risk Factors/Patient Goals at Discharge (Final Review):      Goals and Risk Factor Review - 05/01/16 1352      Core Components/Risk Factors/Patient Goals Review   Personal Goals Review Other   Review Pt is interested in core strengthening and losing wt in belly region. Discussed increasing WL  and weights. Oriented pt to core exercises. Also discussed nutrition and speaking with Marily Memos, nutritionist for dietary cjhanges.   Expected Outcomes Pt will continue to get stronger and progress exercise tolerance. Pt will also lose wt in belly region by way of exercise/nutrition      ITP Comments:     ITP Comments    Row Name 03/08/16 0829           ITP Comments Dr. Armanda Magic, Medical Director          Comments: Pt is making slower than expected progress toward personal goals after completing 13 sessions.   Pt with frequent absences due to family and emotional crisis.  Pt encouraged to take the time for his overall wellbeing.  Pt plans to return to cardiac rehab in a few  weeks.  Pt plans to exercise on his own while absent from rehab.  Recommend  continued exercise and life style modification education including  stress management and relaxation techniques to decrease cardiac risk profile.

## 2016-05-30 NOTE — Addendum Note (Signed)
Encounter addended by: Journii Nierman D Vaida Kerchner on: 05/30/2016  4:36 PM<BR>    Actions taken: Flowsheet accepted, Visit Navigator Flowsheet section accepted

## 2016-06-01 ENCOUNTER — Ambulatory Visit (HOSPITAL_COMMUNITY): Payer: Non-veteran care

## 2016-06-01 ENCOUNTER — Encounter (HOSPITAL_COMMUNITY): Payer: Non-veteran care

## 2016-06-04 ENCOUNTER — Encounter (HOSPITAL_COMMUNITY): Admission: RE | Admit: 2016-06-04 | Payer: Non-veteran care | Source: Ambulatory Visit

## 2016-06-06 ENCOUNTER — Encounter (HOSPITAL_COMMUNITY): Admission: RE | Admit: 2016-06-06 | Payer: Non-veteran care | Source: Ambulatory Visit

## 2016-06-08 ENCOUNTER — Ambulatory Visit (HOSPITAL_COMMUNITY): Payer: Non-veteran care

## 2016-06-08 ENCOUNTER — Encounter (HOSPITAL_COMMUNITY): Payer: Non-veteran care

## 2016-06-11 ENCOUNTER — Encounter (HOSPITAL_COMMUNITY): Payer: Non-veteran care

## 2016-06-13 ENCOUNTER — Encounter (HOSPITAL_COMMUNITY): Payer: Non-veteran care

## 2016-06-15 ENCOUNTER — Ambulatory Visit (HOSPITAL_COMMUNITY): Payer: Non-veteran care

## 2016-06-15 ENCOUNTER — Encounter (HOSPITAL_COMMUNITY): Payer: Non-veteran care

## 2016-06-17 ENCOUNTER — Inpatient Hospital Stay (HOSPITAL_BASED_OUTPATIENT_CLINIC_OR_DEPARTMENT_OTHER)
Admission: EM | Admit: 2016-06-17 | Discharge: 2016-06-20 | DRG: 378 | Disposition: A | Discharge status: Home / Self Care | Payer: Non-veteran care | Attending: Family Medicine | Admitting: Family Medicine

## 2016-06-17 ENCOUNTER — Encounter (HOSPITAL_BASED_OUTPATIENT_CLINIC_OR_DEPARTMENT_OTHER): Payer: Self-pay | Admitting: *Deleted

## 2016-06-17 ENCOUNTER — Inpatient Hospital Stay (HOSPITAL_COMMUNITY): Payer: Non-veteran care

## 2016-06-17 DIAGNOSIS — Z820 Family history of epilepsy and other diseases of the nervous system: Secondary | ICD-10-CM

## 2016-06-17 DIAGNOSIS — D62 Acute posthemorrhagic anemia: Secondary | ICD-10-CM | POA: Diagnosis present

## 2016-06-17 DIAGNOSIS — Z955 Presence of coronary angioplasty implant and graft: Secondary | ICD-10-CM

## 2016-06-17 DIAGNOSIS — E785 Hyperlipidemia, unspecified: Secondary | ICD-10-CM | POA: Diagnosis not present

## 2016-06-17 DIAGNOSIS — Z794 Long term (current) use of insulin: Secondary | ICD-10-CM

## 2016-06-17 DIAGNOSIS — K5731 Diverticulosis of large intestine without perforation or abscess with bleeding: Secondary | ICD-10-CM | POA: Diagnosis not present

## 2016-06-17 DIAGNOSIS — E1169 Type 2 diabetes mellitus with other specified complication: Secondary | ICD-10-CM | POA: Diagnosis not present

## 2016-06-17 DIAGNOSIS — R011 Cardiac murmur, unspecified: Secondary | ICD-10-CM | POA: Diagnosis present

## 2016-06-17 DIAGNOSIS — K5710 Diverticulosis of small intestine without perforation or abscess without bleeding: Secondary | ICD-10-CM | POA: Diagnosis not present

## 2016-06-17 DIAGNOSIS — K5711 Diverticulosis of small intestine without perforation or abscess with bleeding: Secondary | ICD-10-CM | POA: Diagnosis not present

## 2016-06-17 DIAGNOSIS — Z8249 Family history of ischemic heart disease and other diseases of the circulatory system: Secondary | ICD-10-CM | POA: Diagnosis not present

## 2016-06-17 DIAGNOSIS — Z9889 Other specified postprocedural states: Secondary | ICD-10-CM

## 2016-06-17 DIAGNOSIS — I959 Hypotension, unspecified: Secondary | ICD-10-CM | POA: Diagnosis present

## 2016-06-17 DIAGNOSIS — Z79899 Other long term (current) drug therapy: Secondary | ICD-10-CM | POA: Diagnosis not present

## 2016-06-17 DIAGNOSIS — Z7902 Long term (current) use of antithrombotics/antiplatelets: Secondary | ICD-10-CM

## 2016-06-17 DIAGNOSIS — Z7982 Long term (current) use of aspirin: Secondary | ICD-10-CM

## 2016-06-17 DIAGNOSIS — I255 Ischemic cardiomyopathy: Secondary | ICD-10-CM | POA: Diagnosis present

## 2016-06-17 DIAGNOSIS — I2510 Atherosclerotic heart disease of native coronary artery without angina pectoris: Secondary | ICD-10-CM

## 2016-06-17 DIAGNOSIS — E119 Type 2 diabetes mellitus without complications: Secondary | ICD-10-CM

## 2016-06-17 DIAGNOSIS — Z87891 Personal history of nicotine dependence: Secondary | ICD-10-CM

## 2016-06-17 DIAGNOSIS — K922 Gastrointestinal hemorrhage, unspecified: Secondary | ICD-10-CM | POA: Diagnosis not present

## 2016-06-17 DIAGNOSIS — I10 Essential (primary) hypertension: Secondary | ICD-10-CM | POA: Diagnosis present

## 2016-06-17 DIAGNOSIS — Z9861 Coronary angioplasty status: Secondary | ICD-10-CM | POA: Diagnosis not present

## 2016-06-17 DIAGNOSIS — K219 Gastro-esophageal reflux disease without esophagitis: Secondary | ICD-10-CM | POA: Diagnosis present

## 2016-06-17 DIAGNOSIS — I252 Old myocardial infarction: Secondary | ICD-10-CM

## 2016-06-17 DIAGNOSIS — K5790 Diverticulosis of intestine, part unspecified, without perforation or abscess without bleeding: Secondary | ICD-10-CM | POA: Diagnosis present

## 2016-06-17 LAB — COMPREHENSIVE METABOLIC PANEL
ALT: 16 U/L — ABNORMAL LOW (ref 17–63)
ANION GAP: 8 (ref 5–15)
AST: 20 U/L (ref 15–41)
Albumin: 3.8 g/dL (ref 3.5–5.0)
Alkaline Phosphatase: 77 U/L (ref 38–126)
BILIRUBIN TOTAL: 0.5 mg/dL (ref 0.3–1.2)
BUN: 10 mg/dL (ref 6–20)
CO2: 25 mmol/L (ref 22–32)
Calcium: 8.6 mg/dL — ABNORMAL LOW (ref 8.9–10.3)
Chloride: 103 mmol/L (ref 101–111)
Creatinine, Ser: 1 mg/dL (ref 0.61–1.24)
Glucose, Bld: 229 mg/dL — ABNORMAL HIGH (ref 65–99)
Potassium: 3.5 mmol/L (ref 3.5–5.1)
Sodium: 136 mmol/L (ref 135–145)
Total Protein: 7.6 g/dL (ref 6.5–8.1)

## 2016-06-17 LAB — CBC WITH DIFFERENTIAL/PLATELET
Basophils Absolute: 0 10*3/uL (ref 0.0–0.1)
Basophils Relative: 0 %
Eosinophils Absolute: 0.1 10*3/uL (ref 0.0–0.7)
Eosinophils Relative: 1 %
HEMATOCRIT: 37.5 % — AB (ref 39.0–52.0)
Hemoglobin: 12.4 g/dL — ABNORMAL LOW (ref 13.0–17.0)
LYMPHS ABS: 2.3 10*3/uL (ref 0.7–4.0)
LYMPHS PCT: 27 %
MCH: 27.9 pg (ref 26.0–34.0)
MCHC: 33.1 g/dL (ref 30.0–36.0)
MCV: 84.3 fL (ref 78.0–100.0)
MONOS PCT: 10 %
Monocytes Absolute: 0.8 10*3/uL (ref 0.1–1.0)
NEUTROS ABS: 5.4 10*3/uL (ref 1.7–7.7)
Neutrophils Relative %: 62 %
Platelets: 290 10*3/uL (ref 150–400)
RBC: 4.45 MIL/uL (ref 4.22–5.81)
RDW: 14.6 % (ref 11.5–15.5)
WBC: 8.6 10*3/uL (ref 4.0–10.5)

## 2016-06-17 LAB — HEMOGLOBIN AND HEMATOCRIT, BLOOD
HEMATOCRIT: 30.4 % — AB (ref 39.0–52.0)
HEMATOCRIT: 32.3 % — AB (ref 39.0–52.0)
HEMOGLOBIN: 10.4 g/dL — AB (ref 13.0–17.0)
Hemoglobin: 11 g/dL — ABNORMAL LOW (ref 13.0–17.0)

## 2016-06-17 LAB — GLUCOSE, CAPILLARY
GLUCOSE-CAPILLARY: 194 mg/dL — AB (ref 65–99)
GLUCOSE-CAPILLARY: 145 mg/dL — AB (ref 65–99)

## 2016-06-17 LAB — CBG MONITORING, ED: Glucose-Capillary: 251 mg/dL — ABNORMAL HIGH (ref 65–99)

## 2016-06-17 LAB — TYPE AND SCREEN
ABO/RH(D): O POS
Antibody Screen: NEGATIVE

## 2016-06-17 LAB — PREPARE RBC (CROSSMATCH)

## 2016-06-17 LAB — MRSA PCR SCREENING: MRSA by PCR: NEGATIVE

## 2016-06-17 MED ORDER — SODIUM CHLORIDE 0.9 % IV SOLN
INTRAVENOUS | Status: DC
  Administered 2016-06-17 – 2016-06-18 (×2): via INTRAVENOUS

## 2016-06-17 MED ORDER — SODIUM CHLORIDE 0.9 % IV SOLN: 10.0000 mL/h | Freq: Once | INTRAVENOUS | Status: DC

## 2016-06-17 MED ORDER — HYDRALAZINE HCL 20 MG/ML IJ SOLN
5.0000 mg | Freq: Once | INTRAMUSCULAR | Status: AC
  Administered 2016-06-17: 5 mg via INTRAVENOUS
  Filled 2016-06-17: qty 1

## 2016-06-17 MED ORDER — INSULIN ASPART 100 UNIT/ML ~~LOC~~ SOLN
0.0000 [IU] | Freq: Every day | SUBCUTANEOUS | Status: DC
  Administered 2016-06-18: 2 [IU] via SUBCUTANEOUS

## 2016-06-17 MED ORDER — IOPAMIDOL (ISOVUE-370) INJECTION 76%
INTRAVENOUS | Status: AC
  Administered 2016-06-17: 100 mL via INTRAVENOUS
  Filled 2016-06-17: qty 100

## 2016-06-17 MED ORDER — CARVEDILOL 3.125 MG PO TABS: 3.1250 mg | ORAL_TABLET | Freq: Two times a day (BID) | ORAL | Status: DC

## 2016-06-17 MED ORDER — CARVEDILOL 3.125 MG PO TABS
3.1250 mg | ORAL_TABLET | Freq: Two times a day (BID) | ORAL | Status: DC
  Administered 2016-06-18 – 2016-06-20 (×5): 3.125 mg via ORAL
  Filled 2016-06-17 (×5): qty 1

## 2016-06-17 MED ORDER — MORPHINE SULFATE (PF) 4 MG/ML IV SOLN
2.0000 mg | INTRAVENOUS | Status: DC | PRN
  Administered 2016-06-17 (×2): 2 mg via INTRAVENOUS
  Filled 2016-06-17 (×2): qty 1

## 2016-06-17 MED ORDER — INSULIN ASPART 100 UNIT/ML ~~LOC~~ SOLN
0.0000 [IU] | Freq: Three times a day (TID) | SUBCUTANEOUS | Status: DC
  Administered 2016-06-17 – 2016-06-18 (×4): 2 [IU] via SUBCUTANEOUS
  Administered 2016-06-19: 3 [IU] via SUBCUTANEOUS
  Administered 2016-06-19 – 2016-06-20 (×3): 2 [IU] via SUBCUTANEOUS

## 2016-06-17 MED ORDER — SODIUM CHLORIDE 0.9 % IV SOLN
80.0000 mg | INTRAVENOUS | Status: DC
Start: 2016-06-17 — End: 2016-06-18
  Administered 2016-06-17: 18:00:00 80 mg via INTRAVENOUS
  Filled 2016-06-17: qty 80

## 2016-06-17 NOTE — ED Triage Notes (Signed)
Pt reports frequent, bright red bloody stools since around 0600 this morning. Denies fever, vomiting, pain, dizziness, vision changes. Reports mild nausea. Reports hx of same 4 yrs ago and was determined to be heart medicine as the cause.

## 2016-06-17 NOTE — H&P (Signed)
History and Physical    Antonio [name] ZOX:[id number] DOB: 1961-02-22 DOA: 06/17/2016  PCP: [facility]  Patient coming from: [facility].   I have personally briefly reviewed patient's old medical records in [facility]  Chief Complaint: rectal bleeding started at 6 am today.   HPI: Antonio [name] is a 56 y.o. male with medical history significant of  Diverticulosis, GERD, diabetes Mellitus, h/o MI with 2 stents, hypertension, hyperlipidemia, presents to [facility] for 3 episodes of hematochezia started at 6 am, not associated with any abdominal pain. He reports some nausea from not eating, but denies any vomiting or hematemesis. No chest pain or sob, fever or chills, syncope. EDP at [facility] requested admission at [facility] telemetry. Immediately following, pt had 3 to 4 episodes of hematochezia, became hypotensive. 2 units of prbc transfusion were given and he was transferred to step down/ ICU at [facility]. Currently he denies any other complaints other than being hungry. Dr hung from [facility] consulted and recommended CT angiogram of the abdomen which was ordered.      ED Course:   Review of Systems: As per HPI otherwise 10 point review of systems negative.    Past Medical History:  Diagnosis Date  . Diabetes (HCC)   . Diverticulosis   . GERD (gastroesophageal reflux disease)   . Heart attack 2011   2 stents in [facility]  . Heart murmur   . Hemorrhoids   . Hyperlipidemia due to type 2 diabetes mellitus (HCC)   . Hypertension   . Stented coronary artery 2011    Past Surgical History:  Procedure Laterality Date  . CARDIAC CATHETERIZATION N/A 12/23/2015   Procedure: Left Heart Cath and Coronary Angiography;  Surgeon: Peter M Swaziland, MD;  Location: [facility] INVASIVE CV LAB;  Service: Cardiovascular;  Laterality: N/A;  . CARDIAC CATHETERIZATION N/A 12/23/2015   Procedure: Coronary Stent Intervention;  Surgeon: Peter M Swaziland, MD;  Location: [facility] INVASIVE CV LAB;  Service: Cardiovascular;  Laterality: N/A;  . COLON  RESECTION     12 inches taken out in 2011 at [facility] for diverticulosis  . COLONOSCOPY N/A 10/18/2012   Procedure: COLONOSCOPY;  Surgeon: Louis Meckel, MD;  Location: [facility] ENDOSCOPY;  Service: Endoscopy;  Laterality: N/A;  . COLONOSCOPY N/A 10/22/2012   Procedure: COLONOSCOPY;  Surgeon: Iva Boop, MD;  Location: [facility] ENDOSCOPY;  Service: Endoscopy;  Laterality: N/A;  . CORONARY ANGIOPLASTY WITH STENT PLACEMENT  2011   at [facility], 2 stents, locations unknown  . ESOPHAGOGASTRODUODENOSCOPY N/A 10/18/2012   Procedure: ESOPHAGOGASTRODUODENOSCOPY (EGD);  Surgeon: Louis Meckel, MD;  Location: [facility] ENDOSCOPY;  Service: Endoscopy;  Laterality: N/A;  . HERNIA REPAIR    . IRRIGATION AND DEBRIDEMENT ABSCESS N/A 10/26/2012   Procedure: IRRIGATION AND DEBRIDEMENT PERINEAL ABSCESS;  Surgeon: Wilmon Arms. Corliss Skains, MD;  Location: [facility] OR;  Service: General;  Laterality: N/A;     reports that he quit smoking about 13 years ago. His smoking use included Cigarettes. He has never used smokeless tobacco. He reports that he drinks alcohol. He reports that he uses drugs, including Marijuana.  No Known Allergies  Family History  Problem Relation Age of Onset  . Alzheimer's disease Mother 72  . Heart attack Father 42  . CAD Brother 40    triple bypass   REVIEWED   Prior to Admission medications   Medication Sig Start Date End Date Taking? Authorizing Provider  amLODipine (NORVASC) 5 MG tablet Take 1 tablet (5 mg total) by mouth  daily. 01/23/16  Yes Jake Bathe, MD  aspirin EC 81 MG tablet Take 1 tablet (81 mg total) by mouth daily. 12/24/15  Yes Little Ishikawa, NP  atorvastatin (LIPITOR) 80 MG tablet Take 1 tablet (80 mg total) by mouth daily at 6 PM. 01/23/16  Yes Jake Bathe, MD  carvedilol (COREG) 3.125 MG tablet Take 1 tablet (3.125 mg total) by mouth 2 (two) times daily with a meal. 01/23/16  Yes Jake Bathe, MD  clopidogrel (PLAVIX) 75 MG tablet Take 1 tablet (75 mg total) by  mouth daily. 01/23/16  Yes Jake Bathe, MD  docusate sodium (COLACE) 100 MG capsule Take 100 mg by mouth daily as needed for mild constipation.   Yes Historical Provider, MD  insulin aspart protamine - aspart (NOVOLOG MIX 70/30 FLEXPEN) (70-30) 100 UNIT/ML FlexPen Inject 0.15 mLs (15 Units total) into the skin 2 (two) times daily before a meal. Patient taking differently: Inject 35 Units into the skin 2 (two) times daily before a meal.  12/28/15  Yes Belkys A Regalado, MD  lisinopril (PRINIVIL,ZESTRIL) 10 MG tablet Take 1 tablet (10 mg total) by mouth daily. 12/30/15 06/17/16 Yes Rosalio Macadamia, NP  metFORMIN (GLUCOPHAGE-XR) 500 MG 24 hr tablet Take 1,000 mg by mouth 2 (two) times daily.   Yes Historical Provider, MD  nitroGLYCERIN (NITROSTAT) 0.4 MG SL tablet Place 0.4 mg under the tongue every 5 (five) minutes as needed for chest pain.   Yes Historical Provider, MD  pantoprazole (PROTONIX) 40 MG tablet Take 40 mg by mouth daily as needed. For heartburn   Yes Historical Provider, MD  ondansetron (ZOFRAN) 8 MG tablet Take 8 mg by mouth every 8 (eight) hours as needed for nausea or vomiting.    Historical Provider, MD    Physical Exam: Vitals:   06/17/16 1245 06/17/16 1300 06/17/16 1315 06/17/16 1330  BP: (!) 151/102 (!) 146/98 (!) 161/91 (!) 147/96  Pulse: 86 82 83 81  Resp: (!) 29 18 13 13   Temp:      TempSrc:      SpO2: 99% 100% 100% 100%  Weight:      Height:        Constitutional: NAD, calm, comfortable Vitals:   06/17/16 1245 06/17/16 1300 06/17/16 1315 06/17/16 1330  BP: (!) 151/102 (!) 146/98 (!) 161/91 (!) 147/96  Pulse: 86 82 83 81  Resp: (!) 29 18 13 13   Temp:      TempSrc:      SpO2: 99% 100% 100% 100%  Weight:      Height:       Eyes: PERRL, lids and conjunctivae normal ENMT: Mucous membranes are moist. Posterior pharynx clear of any exudate or lesions.Normal dentition.  Neck: normal, supple, no masses, no thyromegaly Respiratory: clear to auscultation bilaterally,  no wheezing, no crackles. Normal respiratory effort. No accessory muscle use.  Cardiovascular: Regular rate and rhythm, no murmurs / rubs / gallops. No extremity edema. 2+ pedal pulses. No carotid bruits.  Abdomen: no tenderness, no masses palpated. No hepatosplenomegaly. Bowel sounds positive.  Musculoskeletal: no clubbing / cyanosis. No joint deformity upper and lower extremities. Good ROM, no contractures. Normal muscle tone.  Skin: no rashes, lesions, ulcers. No induration Neurologic: CN 2-12 grossly intact. Sensation intact, DTR normal. Strength 5/5 in all 4.  Psychiatric: Normal judgment and insight. Alert and oriented x 3. Normal mood.    Labs on Admission: I have personally reviewed following labs and imaging studies  CBC:  Recent  Labs Lab 06/17/16 0837  WBC 8.6  NEUTROABS 5.4  HGB 12.4*  HCT 37.5*  MCV 84.3  PLT 290   Basic Metabolic Panel:  Recent Labs Lab 06/17/16 0837  NA 136  K 3.5  CL 103  CO2 25  GLUCOSE 229*  BUN 10  CREATININE 1.00  CALCIUM 8.6*   GFR: Estimated Creatinine Clearance: 87.4 mL/min (by C-G formula based on SCr of 1 mg/dL). Liver Function Tests:  Recent Labs Lab 06/17/16 0837  AST 20  ALT 16*  ALKPHOS 77  BILITOT 0.5  PROT 7.6  ALBUMIN 3.8   No results for input(s): LIPASE, AMYLASE in the last 168 hours. No results for input(s): AMMONIA in the last 168 hours. Coagulation Profile: No results for input(s): INR, PROTIME in the last 168 hours. Cardiac Enzymes: No results for input(s): CKTOTAL, CKMB, CKMBINDEX, TROPONINI in the last 168 hours. BNP (last 3 results) No results for input(s): PROBNP in the last 8760 hours. HbA1C: No results for input(s): HGBA1C in the last 72 hours. CBG:  Recent Labs Lab 06/17/16 1010  GLUCAP 251*   Lipid Profile: No results for input(s): CHOL, HDL, LDLCALC, TRIG, CHOLHDL, LDLDIRECT in the last 72 hours. Thyroid Function Tests: No results for input(s): TSH, T4TOTAL, FREET4, T3FREE, THYROIDAB  in the last 72 hours. Anemia Panel: No results for input(s): VITAMINB12, FOLATE, FERRITIN, TIBC, IRON, RETICCTPCT in the last 72 hours. Urine analysis:    Component Value Date/Time   COLORURINE YELLOW 12/27/2015 0211   APPEARANCEUR CLEAR 12/27/2015 0211   LABSPEC 1.020 12/27/2015 0211   PHURINE 5.5 12/27/2015 0211   GLUCOSEU 100 (A) 12/27/2015 0211   HGBUR NEGATIVE 12/27/2015 0211   BILIRUBINUR NEGATIVE 12/27/2015 0211   KETONESUR NEGATIVE 12/27/2015 0211   PROTEINUR NEGATIVE 12/27/2015 0211   UROBILINOGEN 0.2 10/23/2012 0249   NITRITE NEGATIVE 12/27/2015 0211   LEUKOCYTESUR NEGATIVE 12/27/2015 0211    Radiological Exams on Admission: No results found.  EKG: Independently reviewed.   Assessment/Plan Active Problems:   Acute GI bleeding   GI bleed  Acute GI bleed suspect diverticular bleed:  Admitted to step down for closer monitoring.  Baseline hemoglobin around 13 and his this am was 12.4 before the 2 units of prbc transfusion.  Repeat H&H pending.  NPO except for ice chips.  IV Protonix as he has a h/o GERD.  Hydration.    Hypertension:  There was a brief period at [facility] where he was hypotensive, currently he is normotensive and asymptomatic.    Diabetes mellitus.  Start SSI.  hgba1c pending.  Once he resumes po intake, restart his insulin regimen.    h/o CAD s/p 2 stents:  Follow up with Dr Anne Fu.  Currently chest pain free, holding aspirin and plavix .  Resume coreg in am.   DVT prophylaxis: scd's Code Status: full code.  Family Communication: family at bedside.  Disposition Plan: pending further eval. Consults called: Dr Elnoria Howard with GI.  Admission status: inpatient SDU.   Kathlen Mody MD [facility] Pager [phone]   If 7PM-7AM, please contact night-coverage [email or web address] Password [facility]  06/17/2016, 3:59 PM

## 2016-06-17 NOTE — ED Notes (Signed)
Wife to pt stepped out and states pt feels like he is going to pass out. Pt found on bedside toilet on monitor very diaphoretic, pale and weak. Pt alert at time to name and place. Large amounts of bright red blood in toilet. MD and RN called to bedside. Pt placed on 2lpm [place].  [facility] at bedside to assist.

## 2016-06-17 NOTE — ED Notes (Signed)
1 L NS bolus hung

## 2016-06-17 NOTE — ED Notes (Signed)
ED Provider at bedside. 

## 2016-06-17 NOTE — ED Notes (Signed)
First unit of blood hung by [facility].

## 2016-06-17 NOTE — Consult Note (Signed)
Reason for Consult: Diverticular bleed Referring Physician: [facility]  Art Sieck HPI: This is a 56 year old male with a PMH of MI, GERD, and diverticular bleed admitted for a recurrent diverticular bleed.  He started to have painless hematochezia this AM and it continued to persist.  He had a similar episode 09/2012 and two colonoscopies were performed within a couple of days of the procedures.  The bleeding was localized to the left side of the colon as there was no blood on the right side of the colon.  His current presentation is the same as his prior diverticular bleed.  At home he had 3 bouts of hematochezia and at [facility] he had 4 bouts of hematochezia.  He was very hypotensive and it triggered a rapid transfer to [facility].  Currently he has some minor cramping abdominal pain.  His blood pressure has stabilized and it is markedly improved.  Past Medical History:  Diagnosis Date  . Diabetes (HCC)   . Diverticulosis   . GERD (gastroesophageal reflux disease)   . Heart attack 2011   2 stents in [facility]  . Heart murmur   . Hemorrhoids   . Hyperlipidemia due to type 2 diabetes mellitus (HCC)   . Hypertension   . Stented coronary artery 2011    Past Surgical History:  Procedure Laterality Date  . CARDIAC CATHETERIZATION N/A 12/23/2015   Procedure: Left Heart Cath and Coronary Angiography;  Surgeon: Peter M Swaziland, MD;  Location: [facility] INVASIVE CV LAB;  Service: Cardiovascular;  Laterality: N/A;  . CARDIAC CATHETERIZATION N/A 12/23/2015   Procedure: Coronary Stent Intervention;  Surgeon: Peter M Swaziland, MD;  Location: [facility] INVASIVE CV LAB;  Service: Cardiovascular;  Laterality: N/A;  . COLON RESECTION     12 inches taken out in 2011 at [facility] for diverticulosis  . COLONOSCOPY N/A 10/18/2012   Procedure: COLONOSCOPY;  Surgeon: Louis Meckel, MD;  Location: [facility] ENDOSCOPY;  Service: Endoscopy;  Laterality: N/A;  . COLONOSCOPY N/A 10/22/2012   Procedure: COLONOSCOPY;   Surgeon: Iva Boop, MD;  Location: [facility] ENDOSCOPY;  Service: Endoscopy;  Laterality: N/A;  . CORONARY ANGIOPLASTY WITH STENT PLACEMENT  2011   at [facility], 2 stents, locations unknown  . ESOPHAGOGASTRODUODENOSCOPY N/A 10/18/2012   Procedure: ESOPHAGOGASTRODUODENOSCOPY (EGD);  Surgeon: Louis Meckel, MD;  Location: [facility] ENDOSCOPY;  Service: Endoscopy;  Laterality: N/A;  . HERNIA REPAIR    . IRRIGATION AND DEBRIDEMENT ABSCESS N/A 10/26/2012   Procedure: IRRIGATION AND DEBRIDEMENT PERINEAL ABSCESS;  Surgeon: Wilmon Arms. Corliss Skains, MD;  Location: [facility] OR;  Service: General;  Laterality: N/A;    Family History  Problem Relation Age of Onset  . Alzheimer's disease Mother 6  . Heart attack Father 16  . CAD Brother 40    triple bypass    Social History:  reports that he quit smoking about 13 years ago. His smoking use included Cigarettes. He has never used smokeless tobacco. He reports that he drinks alcohol. He reports that he uses drugs, including Marijuana.  Allergies: No Known Allergies  Medications:  Scheduled:  Continuous: . sodium chloride      Results for orders placed or performed during the hospital encounter of 06/17/16 (from the past 24 hour(s))  CBC with Differential     Status: Abnormal   Collection Time: 06/17/16  8:37 AM  Result Value Ref Range   WBC 8.6 4.0 - 10.5 K/uL   RBC 4.45 4.22 - 5.81 MIL/uL  Hemoglobin 12.4 (L) 13.0 - 17.0 g/dL   HCT 40.9 (L) 81.1 - 91.4 %   MCV 84.3 78.0 - 100.0 fL   MCH 27.9 26.0 - 34.0 pg   MCHC 33.1 30.0 - 36.0 g/dL   RDW 78.2 95.6 - 21.3 %   Platelets 290 150 - 400 K/uL   Neutrophils Relative % 62 %   Neutro Abs 5.4 1.7 - 7.7 K/uL   Lymphocytes Relative 27 %   Lymphs Abs 2.3 0.7 - 4.0 K/uL   Monocytes Relative 10 %   Monocytes Absolute 0.8 0.1 - 1.0 K/uL   Eosinophils Relative 1 %   Eosinophils Absolute 0.1 0.0 - 0.7 K/uL   Basophils Relative 0 %   Basophils Absolute 0.0 0.0 - 0.1 K/uL  Comprehensive metabolic panel      Status: Abnormal   Collection Time: 06/17/16  8:37 AM  Result Value Ref Range   Sodium 136 135 - 145 mmol/L   Potassium 3.5 3.5 - 5.1 mmol/L   Chloride 103 101 - 111 mmol/L   CO2 25 22 - 32 mmol/L   Glucose, Bld 229 (H) 65 - 99 mg/dL   BUN 10 6 - 20 mg/dL   Creatinine, Ser 0.86 0.61 - 1.24 mg/dL   Calcium 8.6 (L) 8.9 - 10.3 mg/dL   Total Protein 7.6 6.5 - 8.1 g/dL   Albumin 3.8 3.5 - 5.0 g/dL   AST 20 15 - 41 U/L   ALT 16 (L) 17 - 63 U/L   Alkaline Phosphatase 77 38 - 126 U/L   Total Bilirubin 0.5 0.3 - 1.2 mg/dL   GFR calc non Af Amer >60 >60 mL/min   GFR calc Af Amer >60 >60 mL/min   Anion gap 8 5 - 15  CBG monitoring, ED     Status: Abnormal   Collection Time: 06/17/16 10:10 AM  Result Value Ref Range   Glucose-Capillary 251 (H) 65 - 99 mg/dL  Type and screen     Status: None (Preliminary result)   Collection Time: 06/17/16 10:13 AM  Result Value Ref Range   ABO/RH(D) PENDING    Antibody Screen PENDING    Sample Expiration 06/20/2016    Unit Number [id number]    Blood Component Type RED CELLS,LR    Unit division 00    Status of Unit ISSUED    Unit tag comment VERBAL ORDERS PER DR Riley Lam DELO    Transfusion Status OK TO TRANSFUSE    Crossmatch Result PENDING    Unit Number [id number]    Blood Component Type RED CELLS,LR    Unit division 00    Status of Unit ISSUED    Unit tag comment VERBAL ORDERS PER DR Riley Lam DELO    Transfusion Status OK TO TRANSFUSE    Crossmatch Result PENDING      No results found.  ROS:  As stated above in the HPI otherwise negative.  Blood pressure 121/89, pulse 60, temperature 98.1 F (36.7 C), temperature source Oral, resp. rate 18, height 5\' 7"  (1.702 m), weight 88 kg (194 lb), SpO2 100 %.    PE: Gen: NAD, Alert and Oriented HEENT:  [place]/AT, EOMI Neck: Supple, no LAD Lungs: CTA Bilaterally CV: RRR without M/G/R ABM: Soft, NTND, +BS Ext: No C/C/E  Assessment/Plan: 1) Diverticular bleed. 2) Hypotension. 3)  Anemia.   The severity of his bleeding mandates further evaluation with a CT angio to help localize the source.  If necessary he will require IR embolization.  Plan: 1)  CT angio. 2) Follow HGB. 3) Transfuse as necessary.  Cierah Crader D 06/17/2016, 11:23 AM

## 2016-06-17 NOTE — ED Notes (Signed)
First unit blood complete via pressure bag. Second unit being hung at this time by [facility]. MD at bedside.

## 2016-06-17 NOTE — ED Provider Notes (Addendum)
[facility]Y [facility] Provider Note   CSN: [id number] Arrival date & time: 06/17/16  4098     History   Chief Complaint Chief Complaint  Patient presents with  . Rectal Bleeding    HPI Antonio [name] is a 56 y.o. male.  Patient is a 56 year old male with past medical history of coronary artery disease taking Plavix and aspirin. He also has a history of diverticulosis with bleeding. He presents today for evaluation of rectal bleeding. This started this morning. He has had multiple episodes of this. He denies any abdominal pain, fevers, rectal pain. He denies any chest pain or shortness of breath. He denies any dizziness.   The history is provided by the patient.  Rectal Bleeding  Quality:  Bright red Amount:  Moderate Duration:  2 hours Timing:  Constant Chronicity:  New Context comment:  History of diverticulosis Similar prior episodes: yes   Relieved by:  Nothing Worsened by:  Nothing Ineffective treatments:  None tried Associated symptoms: no abdominal pain, no fever and no light-headedness     Past Medical History:  Diagnosis Date  . Diabetes (HCC)   . Diverticulosis   . GERD (gastroesophageal reflux disease)   . Heart attack 2011   2 stents in [facility]  . Heart murmur   . Hemorrhoids   . Hyperlipidemia due to type 2 diabetes mellitus (HCC)   . Hypertension   . Stented coronary artery 2011    Patient Active Problem List   Diagnosis Date Noted  . Vasovagal near syncope   . CAD S/P percutaneous coronary angioplasty   . Hypotension 12/26/2015  . AKI (acute kidney injury) (HCC) 12/26/2015  . NSTEMI (non-ST elevated myocardial infarction) (HCC) 12/22/2015  . Essential hypertension   . Hyperlipidemia due to type 2 diabetes mellitus (HCC)   . Perineal abscess 10/27/2012  . Type 2 diabetes mellitus without complication, without long-term current use of insulin (HCC) 10/19/2012  . Acute lower GI bleeding 10/16/2012  . Diverticulosis 10/16/2012  . Acute blood  loss anemia 10/16/2012  . Coronary artery disease due to lipid rich plaque 10/16/2012    Past Surgical History:  Procedure Laterality Date  . CARDIAC CATHETERIZATION N/A 12/23/2015   Procedure: Left Heart Cath and Coronary Angiography;  Surgeon: Peter M Swaziland, MD;  Location: [facility] INVASIVE CV LAB;  Service: Cardiovascular;  Laterality: N/A;  . CARDIAC CATHETERIZATION N/A 12/23/2015   Procedure: Coronary Stent Intervention;  Surgeon: Peter M Swaziland, MD;  Location: [facility] INVASIVE CV LAB;  Service: Cardiovascular;  Laterality: N/A;  . COLON RESECTION     12 inches taken out in 2011 at [facility] for diverticulosis  . COLONOSCOPY N/A 10/18/2012   Procedure: COLONOSCOPY;  Surgeon: Louis Meckel, MD;  Location: [facility] ENDOSCOPY;  Service: Endoscopy;  Laterality: N/A;  . COLONOSCOPY N/A 10/22/2012   Procedure: COLONOSCOPY;  Surgeon: Iva Boop, MD;  Location: [facility] ENDOSCOPY;  Service: Endoscopy;  Laterality: N/A;  . CORONARY ANGIOPLASTY WITH STENT PLACEMENT  2011   at [facility], 2 stents, locations unknown  . ESOPHAGOGASTRODUODENOSCOPY N/A 10/18/2012   Procedure: ESOPHAGOGASTRODUODENOSCOPY (EGD);  Surgeon: Louis Meckel, MD;  Location: [facility] ENDOSCOPY;  Service: Endoscopy;  Laterality: N/A;  . HERNIA REPAIR    . IRRIGATION AND DEBRIDEMENT ABSCESS N/A 10/26/2012   Procedure: IRRIGATION AND DEBRIDEMENT PERINEAL ABSCESS;  Surgeon: Wilmon Arms. Corliss Skains, MD;  Location: [facility] OR;  Service: General;  Laterality: N/A;       Home Medications    Prior to Admission  medications   Medication Sig Start Date End Date Taking? Authorizing Provider  amLODipine (NORVASC) 5 MG tablet Take 1 tablet (5 mg total) by mouth daily. 01/23/16  Yes Jake Bathe, MD  aspirin EC 81 MG tablet Take 1 tablet (81 mg total) by mouth daily. 12/24/15  Yes Little Ishikawa, NP  atorvastatin (LIPITOR) 80 MG tablet Take 1 tablet (80 mg total) by mouth daily at 6 PM. 01/23/16  Yes Jake Bathe, MD  carvedilol (COREG) 3.125 MG tablet  Take 1 tablet (3.125 mg total) by mouth 2 (two) times daily with a meal. 01/23/16  Yes Jake Bathe, MD  clopidogrel (PLAVIX) 75 MG tablet Take 1 tablet (75 mg total) by mouth daily. 01/23/16  Yes Jake Bathe, MD  docusate sodium (COLACE) 100 MG capsule Take 100 mg by mouth daily as needed for mild constipation.   Yes Historical Provider, MD  insulin aspart protamine - aspart (NOVOLOG MIX 70/30 FLEXPEN) (70-30) 100 UNIT/ML FlexPen Inject 0.15 mLs (15 Units total) into the skin 2 (two) times daily before a meal. 12/28/15  Yes Belkys A Regalado, MD  lisinopril (PRINIVIL,ZESTRIL) 10 MG tablet Take 1 tablet (10 mg total) by mouth daily. 12/30/15 06/17/16 Yes Rosalio Macadamia, NP  metFORMIN (GLUCOPHAGE-XR) 500 MG 24 hr tablet Take 1,000 mg by mouth 2 (two) times daily.   Yes Historical Provider, MD  nitroGLYCERIN (NITROSTAT) 0.4 MG SL tablet Place 0.4 mg under the tongue every 5 (five) minutes as needed for chest pain.   Yes Historical Provider, MD  ondansetron (ZOFRAN) 8 MG tablet Take 8 mg by mouth every 8 (eight) hours as needed for nausea or vomiting.   Yes Historical Provider, MD  pantoprazole (PROTONIX) 40 MG tablet Take 40 mg by mouth daily as needed. For heartburn   Yes Historical Provider, MD    Family History Family History  Problem Relation Age of Onset  . Alzheimer's disease Mother 82  . Heart attack Father 71  . CAD Brother 40    triple bypass    Social History Social History  Substance Use Topics  . Smoking status: Former Smoker    Types: Cigarettes    Quit date: 10/19/2002  . Smokeless tobacco: Never Used  . Alcohol use Yes     Comment: socially, one-2 drinks/month     Allergies   Patient has no known allergies.   Review of Systems Review of Systems  Constitutional: Negative for fever.  Gastrointestinal: Positive for hematochezia. Negative for abdominal pain.  Neurological: Negative for light-headedness.  All other systems reviewed and are negative.    Physical  Exam Updated Vital Signs BP (!) 162/92 (BP Location: Right Arm)   Pulse 82   Temp 98.9 F (37.2 C) (Oral)   Resp 18   Ht 5\' 7"  (1.702 m)   Wt 194 lb (88 kg)   SpO2 99%   BMI 30.38 kg/m   Physical Exam  Constitutional: He is oriented to person, place, and time. He appears well-developed and well-nourished. No distress.  HENT:  Head: Normocephalic and atraumatic.  Mouth/Throat: Oropharynx is clear and moist.  Neck: Normal range of motion. Neck supple.  Cardiovascular: Normal rate and regular rhythm.  Exam reveals no friction rub.   No murmur heard. Pulmonary/Chest: Effort normal and breath sounds normal. No respiratory distress. He has no wheezes. He has no rales.  Abdominal: Soft. Bowel sounds are normal. He exhibits no distension. There is no tenderness.  Musculoskeletal: Normal range of motion. He  exhibits no edema.  Neurological: He is alert and oriented to person, place, and time. Coordination normal.  Skin: Skin is warm and dry. He is not diaphoretic.  Nursing note and vitals reviewed.    ED Treatments / Results  Labs (all labs ordered are listed, but only abnormal results are displayed) Labs Reviewed  CBC WITH DIFFERENTIAL/PLATELET  COMPREHENSIVE METABOLIC PANEL    EKG  EKG Interpretation None       Radiology No results found.  Procedures Procedures (including critical care time)  Medications Ordered in ED Medications - No data to display   Initial Impression / Assessment and Plan / ED Course  I have reviewed the triage vital signs and the nursing notes.  Pertinent labs & imaging results that were available during my care of the patient were reviewed by me and considered in my medical decision making (see chart for details).  Patient presents with frequent bloody stools starting this morning. He has a history of diverticular bleeding and is taking Plavix and aspirin. I've discussed this with Dr. Elnoria Howard from gastroenterology. He is recommending admission  to the hospitalist service. Dr. Blake Divine agrees to admit.  While awaiting transport, the patient continued to have bleeding and became symptomatic. He became diaphoretic, hypotensive, with decreased responsiveness. For this reason he was given 2 units of PRBCs that we have here at med center. He was also given 2 L of fluid.  As the patient became unstable, I updated Dr. Elnoria Howard. He agrees with my assessment to get the patient transferred as soon as possible. He will go to the emergency department where he will undergo further evaluation. I've spoken with Dr. Adriana Simas who understands the situation and agrees to accept in transfer.  CRITICAL CARE Performed by: Geoffery Lyons Total critical care time: 60 minutes Critical care time was exclusive of separately billable procedures and treating other patients. Critical care was necessary to treat or prevent imminent or life-threatening deterioration. Critical care was time spent personally by me on the following activities: development of treatment plan with patient and/or surrogate as well as nursing, discussions with consultants, evaluation of patient's response to treatment, examination of patient, obtaining history from patient or surrogate, ordering and performing treatments and interventions, ordering and review of laboratory studies, ordering and review of radiographic studies, pulse oximetry and re-evaluation of patient's condition.   Final Clinical Impressions(s) / ED Diagnoses   Final diagnoses:  None    New Prescriptions New Prescriptions   No medications on file     Geoffery Lyons, MD 06/17/16 1610    Geoffery Lyons, MD 06/17/16 1035

## 2016-06-18 ENCOUNTER — Encounter (HOSPITAL_COMMUNITY): Payer: Non-veteran care

## 2016-06-18 ENCOUNTER — Telehealth (HOSPITAL_COMMUNITY): Payer: Self-pay | Admitting: *Deleted

## 2016-06-18 DIAGNOSIS — E785 Hyperlipidemia, unspecified: Secondary | ICD-10-CM

## 2016-06-18 DIAGNOSIS — K5731 Diverticulosis of large intestine without perforation or abscess with bleeding: Principal | ICD-10-CM

## 2016-06-18 DIAGNOSIS — E1169 Type 2 diabetes mellitus with other specified complication: Secondary | ICD-10-CM

## 2016-06-18 DIAGNOSIS — I10 Essential (primary) hypertension: Secondary | ICD-10-CM

## 2016-06-18 DIAGNOSIS — D62 Acute posthemorrhagic anemia: Secondary | ICD-10-CM

## 2016-06-18 DIAGNOSIS — K922 Gastrointestinal hemorrhage, unspecified: Secondary | ICD-10-CM

## 2016-06-18 LAB — HEMOGLOBIN A1C
Hgb A1c MFr Bld: 6.7 % — ABNORMAL HIGH (ref 4.8–5.6)
MEAN PLASMA GLUCOSE: 146 mg/dL

## 2016-06-18 LAB — GLUCOSE, CAPILLARY
GLUCOSE-CAPILLARY: 179 mg/dL — AB (ref 65–99)
GLUCOSE-CAPILLARY: 167 mg/dL — AB (ref 65–99)
GLUCOSE-CAPILLARY: 210 mg/dL — AB (ref 65–99)
Glucose-Capillary: 160 mg/dL — ABNORMAL HIGH (ref 65–99)

## 2016-06-18 LAB — TYPE AND SCREEN
ABO/RH(D): O POS
Antibody Screen: NEGATIVE
UNIT DIVISION: 0
Unit division: 0

## 2016-06-18 LAB — ABO/RH: ABO/RH(D): O POS

## 2016-06-18 LAB — HEMOGLOBIN AND HEMATOCRIT, BLOOD
HCT: 28.8 % — ABNORMAL LOW (ref 39.0–52.0)
HCT: 30.4 % — ABNORMAL LOW (ref 39.0–52.0)
Hemoglobin: 9.8 g/dL — ABNORMAL LOW (ref 13.0–17.0)
Hemoglobin: 10.2 g/dL — ABNORMAL LOW (ref 13.0–17.0)

## 2016-06-18 LAB — BPAM RBC
BLOOD PRODUCT EXPIRATION DATE: 201804072359
BLOOD PRODUCT EXPIRATION DATE: 201804072359
ISSUE DATE / TIME: 201803181030
ISSUE DATE / TIME: 201803181030
UNIT TYPE AND RH: 9500
Unit Type and Rh: 9500

## 2016-06-18 LAB — HIV ANTIBODY (ROUTINE TESTING W REFLEX): HIV SCREEN 4TH GENERATION: NONREACTIVE

## 2016-06-18 MED ORDER — LISINOPRIL 10 MG PO TABS
10.0000 mg | ORAL_TABLET | Freq: Every day | ORAL | Status: DC
  Administered 2016-06-18 – 2016-06-20 (×3): 10 mg via ORAL
  Filled 2016-06-18 (×3): qty 1

## 2016-06-18 MED ORDER — ATORVASTATIN CALCIUM 40 MG PO TABS
80.0000 mg | ORAL_TABLET | Freq: Every day | ORAL | Status: DC
  Administered 2016-06-18 – 2016-06-19 (×2): 80 mg via ORAL
  Filled 2016-06-18 (×2): qty 2

## 2016-06-18 MED ORDER — AMLODIPINE BESYLATE 5 MG PO TABS
5.0000 mg | ORAL_TABLET | Freq: Every day | ORAL | Status: DC
  Administered 2016-06-18 – 2016-06-20 (×3): 5 mg via ORAL
  Filled 2016-06-18 (×3): qty 1

## 2016-06-18 MED ORDER — PANTOPRAZOLE SODIUM 40 MG PO TBEC
40.0000 mg | DELAYED_RELEASE_TABLET | Freq: Every day | ORAL | Status: DC
  Administered 2016-06-18 – 2016-06-20 (×3): 40 mg via ORAL
  Filled 2016-06-18 (×3): qty 1

## 2016-06-18 NOTE — Progress Notes (Signed)
[facility] Progress Note  Chief Complaint:   Lower GI bleeding  Subjective: Hungry. No BMs or bleeding in 24 hours.   Objective:  Vital signs in last 24 hours: Temp:  [98.1 F (36.7 C)-99.2 F (37.3 C)] 98.1 F (36.7 C) (03/19 0800) Pulse Rate:  [79-86] 81 (03/18 1330) Resp:  [13-29] 13 (03/19 0800) BP: (136-195)/(70-102) 180/89 (03/19 0800) SpO2:  [98 %-100 %] 100 % (03/19 0800) Last BM Date: 06/17/16 General:   Alert, well-developed,black male in NAD EENT:  Normal hearing, non icteric sclera, conjunctive pink.  Heart:  Regular rate and rhythm; no murmurs.no lower extremity edema Pulm: Normal respiratory effort, lungs CTA bilaterally without wheezes or crackles. Abdomen:  Soft, nondistended, nontender.  Normal bowel sounds, no masses felt. No hepatomegaly.    Neurologic:  Alert and  oriented x4;  grossly normal neurologically. Psych:  Alert and cooperative. Normal mood and affect.   Intake/Output from previous day: 03/18 0701 - 03/19 0700 In: 1345 [I.V.:1245; IV Piggyback:100] Out: 1550 [Urine:1300; Stool:250] Intake/Output this shift: Total I/O In: 300 [I.V.:300] Out: 500 [Urine:500]  Lab Results:  Recent Labs  06/17/16 0837 06/17/16 1633 06/17/16 2345 06/18/16 0839  WBC 8.6  --   --   --   HGB 12.4* 11.0* 10.4* 9.8*  HCT 37.5* 32.3* 30.4* 28.8*  PLT 290  --   --   --    BMET  Recent Labs  06/17/16 0837  NA 136  K 3.5  CL 103  CO2 25  GLUCOSE 229*  BUN 10  CREATININE 1.00  CALCIUM 8.6*   LFT  Recent Labs  06/17/16 0837  PROT 7.6  ALBUMIN 3.8  AST 20  ALT 16*  ALKPHOS 77  BILITOT 0.5   PT/INR No results for input(s): LABPROT, INR in the last 72 hours. Hepatitis Panel No results for input(s): HEPBSAG, HCVAB, HEPAIGM, HEPBIGM in the last 72 hours.  Ct Angio Abdomen W &/or Wo Contrast  Result Date: 06/17/2016 CLINICAL DATA:  Multiple episodes of hematochezia. Hypotensive. Concern for active gastrointestinal  bleeding. EXAM: CT ANGIOGRAPHY ABDOMEN TECHNIQUE: Multidetector CT imaging of the abdomen was performed using the standard protocol during bolus administration of intravenous contrast. Multiplanar reconstructed images and MIPs were obtained and reviewed to evaluate the vascular anatomy. CONTRAST:  100 mL Isovue COMPARISON:  GI bleeding scan 10/24/2012 FINDINGS: VASCULAR Aorta: Aorta is normal caliber with minimal intimal calcification. Celiac: Patent normal hepatic artery and GDA. SMA: Widely patent. The branches of the superior mesenteric artery are traced to the colon. There is no blush of contrast within the ascending, transverse, or descending colon. The sigmoid colon and rectum are not imaged on the CT of the abdomen. Renals: Single an d patent IMA: The proximal IMA is patent. The distal branches of the IMA are not imaged on this CT the abdomen. The rectosigmoid colon is not imaged. Inflow: Normal Veins: Unremarkable Review of the MIP images confirms the above findings. NON-VASCULAR Lower chest: Lung bases are clear. No focal hepatic lesion. Gallbladder normal Hepatobiliary: No focal hepatic lesion.  Gallbladder normal Pancreas: No inflammation. Spleen: Normal Adrenals/Urinary Tract: Kidneys adrenal glands are normal. Stomach/Bowel: The stomach and limited small bowel colon are unremarkable. There are multiple diverticula throughout the transverse and descending colon. No evidence of active gastrointestinal bleeding. Lymphatic: No lymphadenopathy Other: None Musculoskeletal:  No acute findings IMPRESSION: VASCULAR No evidence of active gastrointestinal bleeding in the ascending, transverse, or descending colon. The sigmoid colon and rectosigmoid colon are not imaged.  NON-VASCULAR Extensive diverticular disease of the transverse colon and descending colon. Rectosigmoid colon not imaged. Electronically Signed   By: Genevive Bi M.D.   On: 06/17/2016 22:38    Assessment / Plan:  1. 56 yo male transferred  from [facility] yesterday with painless hematochezia hypotension, likely diverticular hemorrhage in setting of plavix / asa. Hx of same in 2014. Two colonoscopies done at the time.  -CTA last night showed extensive diverticular disease but NO evidence of active bleeding. He hasn't had any bleeding since yesterday. Hemodynamically stable now -clear liquids are okay from GI standpoint -plavix and asa are on hold -no need for a repeat colonoscopy   2. Anemia of acute blood loss. Baseline hgb 12-13. Presenting hgb yesterday morning was 12.4.  He received 2 units of blood yesterday morning but hgb continued to decline and at 9.8 today suggesting significant blood loss. Hopefully hgb will stabilize since bleeding has ceased. Monitor closely. Plavix and ASA on hold  3. DM, HTN, CAD with stents.    Active Problems:   Acute lower GI bleeding   Diverticulosis   Acute blood loss anemia   Type 2 diabetes mellitus without complication, without long-term current use of insulin (HCC)   Essential hypertension   Hyperlipidemia due to type 2 diabetes mellitus (HCC)   Hypotension   CAD S/P percutaneous coronary angioplasty   Acute GI bleeding   GI bleed    LOS: 1 day   Willette Cluster NP 06/18/2016, 12:26 PM  Pager number [phone]  GI ATTENDING  INTERVAL HISTORY DATA REVIEWED. PATIENT PERSONALLY SEEN AND EXAMINED. WIFE IN ROOM. NO FURTHER BLEEDING. PATIENT WITH SIGNIFICANT DIVERTICULAR BLEEDING IN THE FACE OF PLAVIX and aspirin therapy for coronary artery stents placed last September. CT angiogram negative. Colonoscopy 2014 with severe diverticulosis. At this point continue to monitor for evidence of rebleed. Advancing diet. If no further bleeding, I would be okay with resumption of his aspirin and Plavix given the relatively recent (6 months) placement of his stents. You may wish to check with his cardiologist in this regard. We'll follow.  Wilhemina Bonito. Eda Keys., M.D. [facility]  [facility]enter[facility]

## 2016-06-18 NOTE — Care Management Note (Signed)
Case Management Note  Patient Details  Name: Antonio [name] MRN: [id number] Date of Birth: Apr 17, 1960  Subjective/Objective:    G.I. Bleed transferred from [facility] via [facility]                Action/Plan: From Home Date:  June 18, 2016 Chart reviewed for concurrent status and case management needs. Will continue to follow patient progress. Discharge Planning: following for needs Expected discharge date: [id number] Marcelle Smiling, BSN, [place], [place]   [phone]  Expected Discharge Date:                  Expected Discharge Plan:  Home/Self Care  In-House Referral:     Discharge planning Services     Post Acute Care Choice:    Choice offered to:     DME Arranged:    DME Agency:     HH Arranged:    HH Agency:     Status of Service:  In process, will continue to follow  If discussed at Long Length of Stay Meetings, dates discussed:    Additional Comments:  Golda Acre, RN 06/18/2016, 10:01 AM

## 2016-06-18 NOTE — Progress Notes (Signed)
PROGRESS NOTE    Antonio [name]  XBM:[id number] DOB: 21-Aug-1960 DOA: 06/17/2016 PCP: [facility]    Brief Narrative:  56 yo male with ischemic cardiomyopathy on asa and plavix, presents with profuse rectal bleeding, complicated with episode of hypotension. Positive history of diverticulosis with diverticular bleed. Admitted to the step down unit, received 2 units prbc. Hb drop from 12,4 to 9.8. Gastroenterology consulted, recommendation for CT angiography.    Assessment & Plan:   Active Problems:   Acute lower GI bleeding   Diverticulosis   Acute blood loss anemia   Type 2 diabetes mellitus without complication, without long-term current use of insulin (HCC)   Essential hypertension   Hyperlipidemia due to type 2 diabetes mellitus (HCC)   Hypotension   CAD S/P percutaneous coronary angioplasty   Acute GI bleeding   GI bleed   1. Acute blood loss anemia due to diverticular bleed. Will continue antiacid therapy, follow with CT angiography, hb and hct stable at 10.2, no signs of further bleeding. NPO for now, follow on GI recommendations, patient sp 2 units prbc.  2. HTN. Uncontrolled htn, will resume blood pressure agents, no further episodes of hypotension, questionable vagal response.   3. CAD. No chest pain, will continue to hold on antiplatelet therapy. Keep hb 8 to 9 range.   4. DM. Continue insulin sliding scale for glucose cover and monitoring. Capillary glucose [id number].     DVT prophylaxis: scd  Code Status: Full  Family Communication: No family at bedside  Disposition Plan: Home    Consultants:   Gastroenterology   Procedures:   Antimicrobials:   Subjective: No further bleeding, no nausea or vomiting, no chest pain, no dyspnea. No abdominal pain.   Objective: Vitals:   06/18/16 0400 06/18/16 0500 06/18/16 0600 06/18/16 0800  BP:  (!) 164/83 (!) 171/87 (!) 180/89  Pulse:      Resp: (!) 23   13  Temp:      TempSrc:      SpO2: 99% 100% 100%  100%  Weight:      Height:        Intake/Output Summary (Last 24 hours) at 06/18/16 0905 Last data filed at 06/18/16 0600  Gross per 24 hour  Intake             1345 ml  Output             1300 ml  Net               45 ml   Filed Weights   06/17/16 0830 06/17/16 1108  Weight: 88 kg (194 lb) 88 kg (194 lb)    Examination:  General exam: not in pain or dyspnea E ENT. No pallor or icterus, oral mucosa moist.  Respiratory system: Clear to auscultation. Respiratory effort normal. No wheezing, rales or rhonchi.  Cardiovascular system: S1 & S2 heard, RRR. No JVD, murmurs, rubs, gallops or clicks. No pedal edema. Gastrointestinal system: Abdomen distended and protuberant,, soft and nontender. No organomegaly or masses felt. Normal bowel sounds heard. Central nervous system: Alert and oriented. No focal neurological deficits. Extremities: Symmetric 5 x 5 power. Skin: No rashes, lesions or ulcers.     Data Reviewed: I have personally reviewed following labs and imaging studies  CBC:  Recent Labs Lab 06/17/16 0837 06/17/16 1633 06/17/16 2345 06/18/16 0839  WBC 8.6  --   --   --   NEUTROABS 5.4  --   --   --  HGB 12.4* 11.0* 10.4* 9.8*  HCT 37.5* 32.3* 30.4* 28.8*  MCV 84.3  --   --   --   PLT 290  --   --   --    Basic Metabolic Panel:  Recent Labs Lab 06/17/16 0837  NA 136  K 3.5  CL 103  CO2 25  GLUCOSE 229*  BUN 10  CREATININE 1.00  CALCIUM 8.6*   GFR: Estimated Creatinine Clearance: 87.4 mL/min (by C-G formula based on SCr of 1 mg/dL). Liver Function Tests:  Recent Labs Lab 06/17/16 0837  AST 20  ALT 16*  ALKPHOS 77  BILITOT 0.5  PROT 7.6  ALBUMIN 3.8   No results for input(s): LIPASE, AMYLASE in the last 168 hours. No results for input(s): AMMONIA in the last 168 hours. Coagulation Profile: No results for input(s): INR, PROTIME in the last 168 hours. Cardiac Enzymes: No results for input(s): CKTOTAL, CKMB, CKMBINDEX, TROPONINI in the last 168  hours. BNP (last 3 results) No results for input(s): PROBNP in the last 8760 hours. HbA1C: No results for input(s): HGBA1C in the last 72 hours. CBG:  Recent Labs Lab 06/17/16 1010 06/17/16 1803 06/17/16 2145 06/18/16 0754  GLUCAP 251* 194* 145* 179*   Lipid Profile: No results for input(s): CHOL, HDL, LDLCALC, TRIG, CHOLHDL, LDLDIRECT in the last 72 hours. Thyroid Function Tests: No results for input(s): TSH, T4TOTAL, FREET4, T3FREE, THYROIDAB in the last 72 hours. Anemia Panel: No results for input(s): VITAMINB12, FOLATE, FERRITIN, TIBC, IRON, RETICCTPCT in the last 72 hours. Sepsis Labs: No results for input(s): PROCALCITON, LATICACIDVEN in the last 168 hours.  Recent Results (from the past 240 hour(s))  MRSA PCR Screening     Status: None   Collection Time: 06/17/16  2:13 PM  Result Value Ref Range Status   MRSA by PCR NEGATIVE NEGATIVE Final    Comment:        The GeneXpert MRSA Assay (FDA approved for NASAL specimens only), is one component of a comprehensive MRSA colonization surveillance program. It is not intended to diagnose MRSA infection nor to guide or monitor treatment for MRSA infections.          Radiology Studies: Ct Angio Abdomen W &/or Wo Contrast  Result Date: 06/17/2016 CLINICAL DATA:  Multiple episodes of hematochezia. Hypotensive. Concern for active gastrointestinal bleeding. EXAM: CT ANGIOGRAPHY ABDOMEN TECHNIQUE: Multidetector CT imaging of the abdomen was performed using the standard protocol during bolus administration of intravenous contrast. Multiplanar reconstructed images and MIPs were obtained and reviewed to evaluate the vascular anatomy. CONTRAST:  100 mL Isovue COMPARISON:  GI bleeding scan 10/24/2012 FINDINGS: VASCULAR Aorta: Aorta is normal caliber with minimal intimal calcification. Celiac: Patent normal hepatic artery and GDA. SMA: Widely patent. The branches of the superior mesenteric artery are traced to the colon. There is no  blush of contrast within the ascending, transverse, or descending colon. The sigmoid colon and rectum are not imaged on the CT of the abdomen. Renals: Single an d patent IMA: The proximal IMA is patent. The distal branches of the IMA are not imaged on this CT the abdomen. The rectosigmoid colon is not imaged. Inflow: Normal Veins: Unremarkable Review of the MIP images confirms the above findings. NON-VASCULAR Lower chest: Lung bases are clear. No focal hepatic lesion. Gallbladder normal Hepatobiliary: No focal hepatic lesion.  Gallbladder normal Pancreas: No inflammation. Spleen: Normal Adrenals/Urinary Tract: Kidneys adrenal glands are normal. Stomach/Bowel: The stomach and limited small bowel colon are unremarkable. There are multiple diverticula throughout  the transverse and descending colon. No evidence of active gastrointestinal bleeding. Lymphatic: No lymphadenopathy Other: None Musculoskeletal:  No acute findings IMPRESSION: VASCULAR No evidence of active gastrointestinal bleeding in the ascending, transverse, or descending colon. The sigmoid colon and rectosigmoid colon are not imaged. NON-VASCULAR Extensive diverticular disease of the transverse colon and descending colon. Rectosigmoid colon not imaged. Electronically Signed   By: Genevive Bi M.D.   On: 06/17/2016 22:38        Scheduled Meds: . amLODipine  5 mg Oral Daily  . atorvastatin  80 mg Oral q1800  . carvedilol  3.125 mg Oral BID WC  . insulin aspart  0-5 Units Subcutaneous QHS  . insulin aspart  0-9 Units Subcutaneous TID WC  . lisinopril  10 mg Oral Daily  . pantoprazole  40 mg Oral Daily   Continuous Infusions: . sodium chloride 100 mL/hr at 06/17/16 1733     LOS: 1 day      Coralie Keens, MD [facility] Pager [phone]  If 7PM-7AM, please contact night-coverage [email or web address] Password [email or web address] 06/18/2016, 9:05 AM

## 2016-06-19 DIAGNOSIS — I2510 Atherosclerotic heart disease of native coronary artery without angina pectoris: Secondary | ICD-10-CM

## 2016-06-19 DIAGNOSIS — K5710 Diverticulosis of small intestine without perforation or abscess without bleeding: Secondary | ICD-10-CM

## 2016-06-19 DIAGNOSIS — Z9861 Coronary angioplasty status: Secondary | ICD-10-CM

## 2016-06-19 LAB — GLUCOSE, CAPILLARY
GLUCOSE-CAPILLARY: 200 mg/dL — AB (ref 65–99)
GLUCOSE-CAPILLARY: 178 mg/dL — AB (ref 65–99)
GLUCOSE-CAPILLARY: 203 mg/dL — AB (ref 65–99)
Glucose-Capillary: 192 mg/dL — ABNORMAL HIGH (ref 65–99)

## 2016-06-19 LAB — CBC WITH DIFFERENTIAL/PLATELET
BASOS ABS: 0 10*3/uL (ref 0.0–0.1)
Basophils Relative: 0 %
EOS PCT: 1 %
Eosinophils Absolute: 0.1 10*3/uL (ref 0.0–0.7)
HCT: 29.6 % — ABNORMAL LOW (ref 39.0–52.0)
Hemoglobin: 10 g/dL — ABNORMAL LOW (ref 13.0–17.0)
Lymphocytes Relative: 32 %
Lymphs Abs: 2.6 10*3/uL (ref 0.7–4.0)
MCH: 27.5 pg (ref 26.0–34.0)
MCHC: 33.8 g/dL (ref 30.0–36.0)
MCV: 81.3 fL (ref 78.0–100.0)
Monocytes Absolute: 0.9 10*3/uL (ref 0.1–1.0)
Monocytes Relative: 10 %
NEUTROS PCT: 57 %
Neutro Abs: 4.7 10*3/uL (ref 1.7–7.7)
PLATELETS: 218 10*3/uL (ref 150–400)
RBC: 3.64 MIL/uL — AB (ref 4.22–5.81)
RDW: 14.4 % (ref 11.5–15.5)
WBC: 8.2 10*3/uL (ref 4.0–10.5)

## 2016-06-19 LAB — BASIC METABOLIC PANEL
Anion gap: 6 (ref 5–15)
BUN: 8 mg/dL (ref 6–20)
CO2: 25 mmol/L (ref 22–32)
Calcium: 8.3 mg/dL — ABNORMAL LOW (ref 8.9–10.3)
Chloride: 106 mmol/L (ref 101–111)
Creatinine, Ser: 0.74 mg/dL (ref 0.61–1.24)
GFR calc Af Amer: >60 mL/min (ref >60)
GFR calc non Af Amer: >60 mL/min (ref >60)
Glucose, Bld: 180 mg/dL — ABNORMAL HIGH (ref 65–99)
Potassium: 3.3 mmol/L — ABNORMAL LOW (ref 3.5–5.1)
Sodium: 137 mmol/L (ref 135–145)

## 2016-06-19 MED ORDER — CLOPIDOGREL BISULFATE 75 MG PO TABS
75.0000 mg | ORAL_TABLET | Freq: Every day | ORAL | Status: DC
  Administered 2016-06-19 – 2016-06-20 (×2): 75 mg via ORAL
  Filled 2016-06-19 (×2): qty 1

## 2016-06-19 MED ORDER — POTASSIUM CHLORIDE 20 MEQ PO PACK
40.0000 meq | PACK | Freq: Once | ORAL | Status: DC
  Filled 2016-06-19: qty 2

## 2016-06-19 MED ORDER — ASPIRIN EC 81 MG PO TBEC
81.0000 mg | DELAYED_RELEASE_TABLET | Freq: Every day | ORAL | Status: DC
  Administered 2016-06-19 – 2016-06-20 (×2): 81 mg via ORAL
  Filled 2016-06-19 (×2): qty 1

## 2016-06-19 MED ORDER — POTASSIUM CHLORIDE CRYS ER 20 MEQ PO TBCR
40.0000 meq | EXTENDED_RELEASE_TABLET | Freq: Once | ORAL | Status: AC
  Administered 2016-06-19: 40 meq via ORAL
  Filled 2016-06-19: qty 2

## 2016-06-19 NOTE — Progress Notes (Signed)
PROGRESS NOTE    Antonio [name]  ZOX:[id number] DOB: [birth year] DOA: 06/17/2016 PCP: [facility]    Brief Narrative:  56 yo male with ischemic cardiomyopathy on asa and plavix, presents with profuse rectal bleeding, complicated with episode of hypotension. Positive history of diverticulosis with diverticular bleed. Admitted to the step down unit, received 2 units prbc. Hb drop from 12,4 to 9.8. Gastroenterology consulted, recommendation for CT angiography, which is negative for active bleeding. Resumed on asa and plavix.    Assessment & Plan:   Active Problems:   Acute lower GI bleeding   Diverticulosis   Acute blood loss anemia   Type 2 diabetes mellitus without complication, without long-term current use of insulin (HCC)   Essential hypertension   Hyperlipidemia due to type 2 diabetes mellitus (HCC)   Hypotension   CAD S/P percutaneous coronary angioplasty   Acute GI bleeding   GI bleed   1. Acute blood loss anemia due to diverticular bleed. Continue antiacid therapy with po pantoprazole. CT angiography with no evidence of active bleeding, hb and hct stable at 10.o/ 292.6, no signs of further bleeding. sp 2 units prbc.Continue telemetry monitoring for now. Diet advanced per GI.   2. HTN. Continue blood pressure agents with lisinopril, amlodipine and coreg. No further bleeding.   3. CAD. Angioplasty performed on 09.17 to the RCA drug eluding stents, will resume dual antiplatelet therapy with asa and plavix and follow for any signs of bleeding. No chest pain, keep hb above 8.   4. DM. Insulin sliding scale for glucose cover and monitoring. Capillary glucose 160-200-178, diet has been advanced.     DVT prophylaxis: scd  Code Status: Full  Family Communication: No family at bedside  Disposition Plan: Home    Consultants:   Gastroenterology   Procedures:   Antimicrobials:   Subjective: Patient feeling well no further hematochezia, no nausea or vomiting.  Tolerating po well.   Objective: Vitals:   06/19/16 0400 06/19/16 0500 06/19/16 0600 06/19/16 0800  BP: (!) 153/86   (!) 167/104  Pulse:      Resp: (!) 24 (!) 25 20 17   Temp: 98.3 F (36.8 C)     TempSrc: Oral     SpO2: 99% 100% 98% 100%  Weight:      Height:        Intake/Output Summary (Last 24 hours) at 06/19/16 0856 Last data filed at 06/19/16 0800  Gross per 24 hour  Intake          2293.75 ml  Output             2051 ml  Net           242.75 ml   Filed Weights   06/17/16 0830 06/17/16 1108  Weight: 88 kg (194 lb) 88 kg (194 lb)    Examination:  General exam: not in pain or dyspnea E ENT: no pallor or icterus Respiratory system: Clear to auscultation. Respiratory effort normal. No wheezing or rohnchi. Cardiovascular system: S1 & S2 heard, RRR. No JVD, murmurs, rubs, gallops or clicks. No pedal edema. Gastrointestinal system: Abdomen is nondistended, soft and nontender. No organomegaly or masses felt. Normal bowel sounds heard. Central nervous system: Alert and oriented. No focal neurological deficits. Extremities: Symmetric 5 x 5 power. Skin: No rashes, lesions or ulcers     Data Reviewed: I have personally reviewed following labs and imaging studies  CBC:  Recent Labs Lab 06/17/16 0837 06/17/16 1633 06/17/16 2345 06/18/16 0839 06/18/16 1350  06/19/16 0332  WBC 8.6  --   --   --   --  8.2  NEUTROABS 5.4  --   --   --   --  4.7  HGB 12.4* 11.0* 10.4* 9.8* 10.2* 10.0*  HCT 37.5* 32.3* 30.4* 28.8* 30.4* 29.6*  MCV 84.3  --   --   --   --  81.3  PLT 290  --   --   --   --  218   Basic Metabolic Panel:  Recent Labs Lab 06/17/16 0837 06/19/16 0332  NA 136 137  K 3.5 3.3*  CL 103 106  CO2 25 25  GLUCOSE 229* 180*  BUN 10 8  CREATININE 1.00 0.74  CALCIUM 8.6* 8.3*   GFR: Estimated Creatinine Clearance: 109.2 mL/min (by C-G formula based on SCr of 0.74 mg/dL). Liver Function Tests:  Recent Labs Lab 06/17/16 0837  AST 20  ALT 16*  ALKPHOS  77  BILITOT 0.5  PROT 7.6  ALBUMIN 3.8   No results for input(s): LIPASE, AMYLASE in the last 168 hours. No results for input(s): AMMONIA in the last 168 hours. Coagulation Profile: No results for input(s): INR, PROTIME in the last 168 hours. Cardiac Enzymes: No results for input(s): CKTOTAL, CKMB, CKMBINDEX, TROPONINI in the last 168 hours. BNP (last 3 results) No results for input(s): PROBNP in the last 8760 hours. HbA1C:  Recent Labs  06/17/16 1633  HGBA1C 6.7*   CBG:  Recent Labs Lab 06/18/16 0754 06/18/16 1308 06/18/16 1714 06/18/16 2208 06/19/16 0812  GLUCAP 179* 167* 210* 160* 200*   Lipid Profile: No results for input(s): CHOL, HDL, LDLCALC, TRIG, CHOLHDL, LDLDIRECT in the last 72 hours. Thyroid Function Tests: No results for input(s): TSH, T4TOTAL, FREET4, T3FREE, THYROIDAB in the last 72 hours. Anemia Panel: No results for input(s): VITAMINB12, FOLATE, FERRITIN, TIBC, IRON, RETICCTPCT in the last 72 hours. Sepsis Labs: No results for input(s): PROCALCITON, LATICACIDVEN in the last 168 hours.  Recent Results (from the past 240 hour(s))  MRSA PCR Screening     Status: None   Collection Time: 06/17/16  2:13 PM  Result Value Ref Range Status   MRSA by PCR NEGATIVE NEGATIVE Final    Comment:        The GeneXpert MRSA Assay (FDA approved for NASAL specimens only), is one component of a comprehensive MRSA colonization surveillance program. It is not intended to diagnose MRSA infection nor to guide or monitor treatment for MRSA infections.          Radiology Studies: Ct Angio Abdomen W &/or Wo Contrast  Result Date: 06/17/2016 CLINICAL DATA:  Multiple episodes of hematochezia. Hypotensive. Concern for active gastrointestinal bleeding. EXAM: CT ANGIOGRAPHY ABDOMEN TECHNIQUE: Multidetector CT imaging of the abdomen was performed using the standard protocol during bolus administration of intravenous contrast. Multiplanar reconstructed images and MIPs  were obtained and reviewed to evaluate the vascular anatomy. CONTRAST:  100 mL Isovue COMPARISON:  GI bleeding scan 10/24/2012 FINDINGS: VASCULAR Aorta: Aorta is normal caliber with minimal intimal calcification. Celiac: Patent normal hepatic artery and GDA. SMA: Widely patent. The branches of the superior mesenteric artery are traced to the colon. There is no blush of contrast within the ascending, transverse, or descending colon. The sigmoid colon and rectum are not imaged on the CT of the abdomen. Renals: Single an d patent IMA: The proximal IMA is patent. The distal branches of the IMA are not imaged on this CT the abdomen. The rectosigmoid colon is not imaged.  Inflow: Normal Veins: Unremarkable Review of the MIP images confirms the above findings. NON-VASCULAR Lower chest: Lung bases are clear. No focal hepatic lesion. Gallbladder normal Hepatobiliary: No focal hepatic lesion.  Gallbladder normal Pancreas: No inflammation. Spleen: Normal Adrenals/Urinary Tract: Kidneys adrenal glands are normal. Stomach/Bowel: The stomach and limited small bowel colon are unremarkable. There are multiple diverticula throughout the transverse and descending colon. No evidence of active gastrointestinal bleeding. Lymphatic: No lymphadenopathy Other: None Musculoskeletal:  No acute findings IMPRESSION: VASCULAR No evidence of active gastrointestinal bleeding in the ascending, transverse, or descending colon. The sigmoid colon and rectosigmoid colon are not imaged. NON-VASCULAR Extensive diverticular disease of the transverse colon and descending colon. Rectosigmoid colon not imaged. Electronically Signed   By: Genevive Bi M.D.   On: 06/17/2016 22:38        Scheduled Meds: . amLODipine  5 mg Oral Daily  . aspirin EC  81 mg Oral Daily  . atorvastatin  80 mg Oral q1800  . carvedilol  3.125 mg Oral BID WC  . clopidogrel  75 mg Oral Daily  . insulin aspart  0-5 Units Subcutaneous QHS  . insulin aspart  0-9 Units  Subcutaneous TID WC  . lisinopril  10 mg Oral Daily  . pantoprazole  40 mg Oral Daily   Continuous Infusions:   LOS: 2 days      Sofi Bryars Annett Gula, MD [facility] Pager [phone]  If 7PM-7AM, please contact night-coverage [email or web address] Password [facility] 06/19/2016, 8:56 AM

## 2016-06-19 NOTE — Progress Notes (Signed)
Inpatient Diabetes Program Recommendations  AACE/ADA: New Consensus Statement on Inpatient Glycemic Control (2015)  Target Ranges:  Prepandial:   less than 140 mg/dL      Peak postprandial:   less than 180 mg/dL (1-2 hours)      Critically ill patients:  140 - 180 mg/dL   Results for NICOHLAS, STJULIEN (MRN [id number]) as of 06/19/2016 09:18  Ref. Range 06/18/2016 07:54 06/18/2016 13:08 06/18/2016 17:14 06/18/2016 22:08  Glucose-Capillary Latest Ref Range: 65 - 99 mg/dL 062 (H) 694 (H) 854 (H) 160 (H)   Results for JIMAR, THONE (MRN [id number]) as of 06/19/2016 09:18  Ref. Range 06/19/2016 08:12  Glucose-Capillary Latest Ref Range: 65 - 99 mg/dL 381 (H)    Admit with: GIB  History: DM  Home DM Meds: 70/30 Insulin- 35 units BID       Metformin 1000 mg BID  Current Insulin Orders: Novolog Sensitive Correction Scale/ SSI (0-9 units) TID AC + HS        MD- Note patient currently getting solid PO diet.  CBG 200 mg/dl this AM.  Please consider the following in-hospital insulin adjustments:  1. Start 70/30 Insulin- 15 units BID with meals (~50% home dose to start)  2. Change PO diet to Carbohydrate Modified diet (currently ordered as Regular diet)     --Will follow patient during hospitalization--  Ambrose Finland RN, MSN, CDE Diabetes Coordinator Inpatient Glycemic Control Team Team Pager: [phone] (8a-5p)

## 2016-06-19 NOTE — Progress Notes (Addendum)
[facility] Progress Note  CC:  [facility]  Subjective:  No sign of active bleeding in about 48 hours.  Had a BM yesterday that was dark colored, he figured it was old blood.  Objective:  Vital signs in last 24 hours: Temp:  [98.3 F (36.8 C)-98.8 F (37.1 C)] 98.3 F (36.8 C) (03/20 0400) Resp:  [14-28] 17 (03/20 0800) BP: (153-180)/(76-104) 167/104 (03/20 0800) SpO2:  [98 %-100 %] 100 % (03/20 0800) Last BM Date: 06/18/16 General:  Alert, Well-developed, in NAD Heart:  Regular rate and rhythm; no murmurs Pulm:  CTAB.  No increased WOB. Abdomen:  Soft, nontender and non-distended. Normal bowel sounds, without guarding, and without rebound.   Extremities:  Without edema. Neurologic:  Alert and oriented x 4;  grossly normal neurologically. Psych:  Alert and cooperative. Normal mood and affect.  Intake/Output from previous day: 03/19 0701 - 03/20 0700 In: 1903.8 [I.V.:1903.8] Out: 2051 [Urine:2050; Stool:1] Intake/Output this shift: Total I/O In: 390 [P.O.:240; I.V.:150] Out: -   Lab Results:  Recent Labs  06/17/16 0837  06/18/16 0839 06/18/16 1350 06/19/16 0332  WBC 8.6  --   --   --  8.2  HGB 12.4*  < > 9.8* 10.2* 10.0*  HCT 37.5*  < > 28.8* 30.4* 29.6*  PLT 290  --   --   --  218  < > = values in this interval not displayed. BMET  Recent Labs  06/17/16 0837 06/19/16 0332  NA 136 137  K 3.5 3.3*  CL 103 106  CO2 25 25  GLUCOSE 229* 180*  BUN 10 8  CREATININE 1.00 0.74  CALCIUM 8.6* 8.3*   LFT  Recent Labs  06/17/16 0837  PROT 7.6  ALBUMIN 3.8  AST 20  ALT 16*  ALKPHOS 77  BILITOT 0.5   Ct Angio Abdomen W &/or Wo Contrast  Result Date: 06/17/2016 CLINICAL DATA:  Multiple episodes of hematochezia. Hypotensive. Concern for active gastrointestinal bleeding. EXAM: CT ANGIOGRAPHY ABDOMEN TECHNIQUE: Multidetector CT imaging of the abdomen was performed using the standard protocol during bolus administration of intravenous contrast.  Multiplanar reconstructed images and MIPs were obtained and reviewed to evaluate the vascular anatomy. CONTRAST:  100 mL Isovue COMPARISON:  GI bleeding scan 10/24/2012 FINDINGS: VASCULAR Aorta: Aorta is normal caliber with minimal intimal calcification. Celiac: Patent normal hepatic artery and GDA. SMA: Widely patent. The branches of the superior mesenteric artery are traced to the colon. There is no blush of contrast within the ascending, transverse, or descending colon. The sigmoid colon and rectum are not imaged on the CT of the abdomen. Renals: Single an d patent IMA: The proximal IMA is patent. The distal branches of the IMA are not imaged on this CT the abdomen. The rectosigmoid colon is not imaged. Inflow: Normal Veins: Unremarkable Review of the MIP images confirms the above findings. NON-VASCULAR Lower chest: Lung bases are clear. No focal hepatic lesion. Gallbladder normal Hepatobiliary: No focal hepatic lesion.  Gallbladder normal Pancreas: No inflammation. Spleen: Normal Adrenals/Urinary Tract: Kidneys adrenal glands are normal. Stomach/Bowel: The stomach and limited small bowel colon are unremarkable. There are multiple diverticula throughout the transverse and descending colon. No evidence of active gastrointestinal bleeding. Lymphatic: No lymphadenopathy Other: None Musculoskeletal:  No acute findings IMPRESSION: VASCULAR No evidence of active gastrointestinal bleeding in the ascending, transverse, or descending colon. The sigmoid colon and rectosigmoid colon are not imaged. NON-VASCULAR Extensive diverticular disease of the transverse colon and descending colon. Rectosigmoid colon not  imaged. Electronically Signed   By: Genevive Bi M.D.   On: 06/17/2016 22:38    Assessment / Plan: 1. 56 yo male transferred from [facility] 3/18 with painless hematochezia, hypotension, likely diverticular hemorrhage in setting of plavix / asa. Hx of same in 2014. Two colonoscopies done at the time.  -CTA  showed extensive diverticular disease but NO evidence of active bleeding. He hasn't had any active bleeding in about 48 hours. Hemodynamically stable now.  Tolerating solid diet.   -plavix and asa are on hold.  Ok to resume upon discharge as long as there is no further bleeding -no need for a repeat colonoscopy  2. Anemia of acute blood loss. Baseline hgb 12-13. Presenting hgb was 12.4.  He received 2 units of blood yesterday morning but hgb continued to decline and at 9.8 on 3/19 suggesting significant blood loss. Hgb continuing to increase/stabilize and is 10 grams this AM  3. DM, HTN, CAD with stents in 12/2015   LOS: 2 days  Antonio [name], Antonio D.  06/19/2016, 9:16 AM Pager number [phone]  GI ATTENDING  Interval history data reviewed. Patient seen and examined personally. Agree with interval progress note. No further bleeding. Walking in hallway. Looks great. Hemoglobin stable. Okay for discharge home in a.m. if no further problems. I would recommend RESUMING his aspirin and Plavix given the proximity of cardiac stent placement. We're available for questions or problems. Will sign off. Thank you.  Wilhemina Bonito. Eda Keys., M.D. [facility] Division of Gastroenterology

## 2016-06-20 ENCOUNTER — Encounter (HOSPITAL_COMMUNITY): Admission: RE | Admit: 2016-06-20 | Payer: Non-veteran care | Source: Ambulatory Visit

## 2016-06-20 DIAGNOSIS — E119 Type 2 diabetes mellitus without complications: Secondary | ICD-10-CM

## 2016-06-20 DIAGNOSIS — K5711 Diverticulosis of small intestine without perforation or abscess with bleeding: Secondary | ICD-10-CM

## 2016-06-20 LAB — CBC WITH DIFFERENTIAL/PLATELET
Basophils Absolute: 0 10*3/uL (ref 0.0–0.1)
Basophils Relative: 0 %
Eosinophils Absolute: 0.1 10*3/uL (ref 0.0–0.7)
Eosinophils Relative: 1 %
HEMATOCRIT: 31.2 % — AB (ref 39.0–52.0)
Hemoglobin: 10.7 g/dL — ABNORMAL LOW (ref 13.0–17.0)
Lymphocytes Relative: 31 %
Lymphs Abs: 2.8 10*3/uL (ref 0.7–4.0)
MCH: 28.4 pg (ref 26.0–34.0)
MCHC: 34.3 g/dL (ref 30.0–36.0)
MCV: 82.8 fL (ref 78.0–100.0)
MONO ABS: 0.7 10*3/uL (ref 0.1–1.0)
Monocytes Relative: 8 %
NEUTROS ABS: 5.4 10*3/uL (ref 1.7–7.7)
Neutrophils Relative %: 60 %
Platelets: 245 10*3/uL (ref 150–400)
RBC: 3.77 MIL/uL — ABNORMAL LOW (ref 4.22–5.81)
RDW: 14.5 % (ref 11.5–15.5)
WBC: 9 10*3/uL (ref 4.0–10.5)

## 2016-06-20 LAB — BASIC METABOLIC PANEL
ANION GAP: 7 (ref 5–15)
BUN: 11 mg/dL (ref 6–20)
CALCIUM: 8.9 mg/dL (ref 8.9–10.3)
CHLORIDE: 104 mmol/L (ref 101–111)
CO2: 28 mmol/L (ref 22–32)
CREATININE: 0.87 mg/dL (ref 0.61–1.24)
GFR calc Af Amer: >60 mL/min (ref >60)
GFR calc non Af Amer: >60 mL/min (ref >60)
GLUCOSE: 178 mg/dL — AB (ref 65–99)
Potassium: 3.5 mmol/L (ref 3.5–5.1)
Sodium: 139 mmol/L (ref 135–145)

## 2016-06-20 LAB — GLUCOSE, CAPILLARY: Glucose-Capillary: 194 mg/dL — ABNORMAL HIGH (ref 65–99)

## 2016-06-20 MED ORDER — INSULIN ASPART PROT & ASPART (70-30 MIX) 100 UNIT/ML PEN: 35.0000 [IU] | PEN_INJECTOR | Freq: Two times a day (BID) | SUBCUTANEOUS | Status: DC

## 2016-06-20 NOTE — Progress Notes (Signed)
[facility]   Patient admitted for hematochezia leading to anemia and hypotension, likely due to diverticular bleed. Was transfused 2 units of PRBC's. Hemoglobin has remained stable. No further bleeding for the past 72 hrs. Patient tolerating diet well. Have no complaints today. Patient stable for discharge, and has been cleared by GI to resume ASA and Plavix.   Full discharge summary to follow.    Latrelle Dodrill, MD

## 2016-06-20 NOTE — Progress Notes (Signed)
Date: June 20, 2016 Discharge orders review for case management needs.  None found Rhonda Davis, BSN, RN3, CCM: [phone] 

## 2016-06-21 NOTE — Discharge Summary (Addendum)
Physician Discharge Summary  Brighten Orndoff  ZOX:[id number]  DOB: 1960/05/19  DOA: 06/17/2016 PCP: [facility]  Admit date: 06/17/2016 Discharge date: 06/21/2016  Admitted From: Home Disposition:  Home  Recommendations for Outpatient Follow-up:  1. Follow up with PCP in 1-2 weeks 2. Please obtain BMP/CBC to check renal function and hemoglobin in one week  Discharge Condition: Stable CODE STATUS: FULL Diet recommendation: Heart Healthy   Brief/Interim Summary: Landin Marcoux is a 56 y.o. male with medical history significant of  Diverticulosis, GERD, diabetes Mellitus, h/o MI with 2 recent stents, hypertension, hyperlipidemia, presents to [facility] for 3 episodes of hematochezia. Patient was transferred to [facility] from [facility] due to drop in hemoglobin and hypotension. Patient was given 2 units of PRBC's, GI was consulted whom recommended CT angiogram of the abdomen which was negative for active bleeding. Patient was observed for 72 hours, did not have any more rectal bleed, diet was advanced and tolerated well. Hemoglobin remained stable during the hospital stay. BP stabilized and home medication were resumed. Patient will be discharge home with GI follow up as outpatient   Discharge Diagnoses/Hospital Course:  Acute blood loss anemia due to diverticular bleed.  CT angio with no evidence of active bleed. GI does not recommend colonoscopy at this time. Patient was transfused a total of 2 PRBC's and hemoglobin remained stable after transfusion. Patient diet was advance and tolerated well with no signs of further bleeding. Patient will need to follow up with GI as outpatient in 2-3 weeks.  CAD w recent stent placement - asymptomatic  GI cleared patient to resume ASA and Plavix given proximity of stent placement  Resume BB, ACE and statin   Follow up with cardiology   HTN - initially hypotensive due to bleeding, resolved after transfusion and IVF. Resume home BP medication, no changes  were made during hospital stay. Follow up with PCP in 1 week   DM type 2 - CBG's stable during hospital stay  No change in medications were made  Monitor finger glucose goal < 140 fasting   All other chronic medical condition were stable during the hospitalization.  On the day of the discharge the patient's vitals were stable, and no other acute medical condition were reported by patient. Patient was felt safe to be discharged to home  Discharge Instructions  You were cared for by a hospitalist during your hospital stay. If you have any questions about your discharge medications or the care you received while you were in the hospital after you are discharged, you can call the unit and asked to speak with the hospitalist on call if the hospitalist that took care of you is not available. Once you are discharged, your primary care physician will handle any further medical issues. Please note that NO REFILLS for any discharge medications will be authorized once you are discharged, as it is imperative that you return to your primary care physician (or establish a relationship with a primary care physician if you do not have one) for your aftercare needs so that they can reassess your need for medications and monitor your lab values.  Discharge Instructions    Call MD for:  difficulty breathing, headache or visual disturbances    Complete by:  As directed    Call MD for:  extreme fatigue    Complete by:  As directed    Call MD for:  hives    Complete by:  As directed    Call MD for:  persistant dizziness or light-headedness    Complete by:  As directed    Call MD for:  persistant nausea and vomiting    Complete by:  As directed    Call MD for:  redness, tenderness, or signs of infection (pain, swelling, redness, odor or green/yellow discharge around incision site)    Complete by:  As directed    Call MD for:  severe uncontrolled pain    Complete by:  As directed    Call MD for:  temperature  >100.4    Complete by:  As directed    Diet - low sodium heart healthy    Complete by:  As directed    Increase activity slowly    Complete by:  As directed      Allergies as of 06/20/2016   No Known Allergies     Medication List    TAKE these medications   amLODipine 5 MG tablet Commonly known as:  NORVASC Take 1 tablet (5 mg total) by mouth daily.   aspirin EC 81 MG tablet Take 1 tablet (81 mg total) by mouth daily.   atorvastatin 80 MG tablet Commonly known as:  LIPITOR Take 1 tablet (80 mg total) by mouth daily at 6 PM.   carvedilol 3.125 MG tablet Commonly known as:  COREG Take 1 tablet (3.125 mg total) by mouth 2 (two) times daily with a meal.   clopidogrel 75 MG tablet Commonly known as:  PLAVIX Take 1 tablet (75 mg total) by mouth daily.   docusate sodium 100 MG capsule Commonly known as:  COLACE Take 100 mg by mouth daily as needed for mild constipation.   insulin aspart protamine - aspart (70-30) 100 UNIT/ML FlexPen Commonly known as:  NOVOLOG MIX 70/30 FLEXPEN Inject 0.35 mLs (35 Units total) into the skin 2 (two) times daily before a meal.   lisinopril 10 MG tablet Commonly known as:  PRINIVIL,ZESTRIL Take 1 tablet (10 mg total) by mouth daily.   metFORMIN 500 MG 24 hr tablet Commonly known as:  GLUCOPHAGE-XR Take 1,000 mg by mouth 2 (two) times daily.   nitroGLYCERIN 0.4 MG SL tablet Commonly known as:  NITROSTAT Place 0.4 mg under the tongue every 5 (five) minutes as needed for chest pain.   ondansetron 8 MG tablet Commonly known as:  ZOFRAN Take 8 mg by mouth every 8 (eight) hours as needed for nausea or vomiting.   pantoprazole 40 MG tablet Commonly known as:  PROTONIX Take 40 mg by mouth daily as needed. For heartburn      Follow-up Information    [facility]. Schedule an appointment as soon as possible for a visit in 1 week(s).   Contact information: [address] [facility] [place]  [address] [phone]        Yancey Flemings, MD. Schedule an appointment as soon as possible for a visit in 2 week(s).   Specialty:  Gastroenterology Contact information: [address] [place] [address] [phone]          No Known Allergies  Consultations:  GI - [place]   Procedures/Studies: Ct Angio Abdomen W &/or Wo Contrast  Result Date: 06/17/2016 CLINICAL DATA:  Multiple episodes of hematochezia. Hypotensive. Concern for active gastrointestinal bleeding. EXAM: CT ANGIOGRAPHY ABDOMEN TECHNIQUE: Multidetector CT imaging of the abdomen was performed using the standard protocol during bolus administration of intravenous contrast. Multiplanar reconstructed images and MIPs were obtained and reviewed to evaluate the vascular anatomy. CONTRAST:  100 mL Isovue COMPARISON:  GI bleeding scan 10/24/2012 FINDINGS: VASCULAR Aorta: Aorta is normal caliber with minimal intimal calcification. Celiac: Patent normal hepatic artery and GDA. SMA: Widely patent. The branches of the superior mesenteric artery are traced to the colon. There is no blush of contrast within the ascending, transverse, or descending colon. The sigmoid colon and rectum are not imaged on the CT of the abdomen. Renals: Single an d patent IMA: The proximal IMA is patent. The distal branches of the IMA are not imaged on this CT the abdomen. The rectosigmoid colon is not imaged. Inflow: Normal Veins: Unremarkable Review of the MIP images confirms the above findings. NON-VASCULAR Lower chest: Lung bases are clear. No focal hepatic lesion. Gallbladder normal Hepatobiliary: No focal hepatic lesion.  Gallbladder normal Pancreas: No inflammation. Spleen: Normal Adrenals/Urinary Tract: Kidneys adrenal glands are normal. Stomach/Bowel: The stomach and limited small bowel colon are unremarkable. There are multiple diverticula throughout the transverse and descending colon. No evidence of active gastrointestinal bleeding. Lymphatic: No  lymphadenopathy Other: None Musculoskeletal:  No acute findings IMPRESSION: VASCULAR No evidence of active gastrointestinal bleeding in the ascending, transverse, or descending colon. The sigmoid colon and rectosigmoid colon are not imaged. NON-VASCULAR Extensive diverticular disease of the transverse colon and descending colon. Rectosigmoid colon not imaged. Electronically Signed   By: Genevive Bi M.D.   On: 06/17/2016 22:38     Discharge Exam: Vitals:   06/20/16 0800 06/20/16 0900  BP: (!) 152/89   Pulse:    Resp: 12 16  Temp:     Vitals:   06/20/16 0600 06/20/16 0700 06/20/16 0800 06/20/16 0900  BP:  (!) 167/93 (!) 152/89   Pulse:      Resp: 13 16 12 16   Temp:  98.3 F (36.8 C)    TempSrc:  Oral    SpO2:  100%    Weight:      Height:        General: Pt is alert, awake, not in acute distress Cardiovascular: RRR, S1/S2 +, no rubs, no gallops Respiratory: CTA bilaterally, no wheezing, no rhonchi Abdominal: Soft, NT, ND, bowel sounds + Extremities: no edema, no cyanosis   The results of significant diagnostics from this hospitalization (including imaging, microbiology, ancillary and laboratory) are listed below for reference.     Microbiology: Recent Results (from the past 240 hour(s))  MRSA PCR Screening     Status: None   Collection Time: 06/17/16  2:13 PM  Result Value Ref Range Status   MRSA by PCR NEGATIVE NEGATIVE Final    Comment:        The GeneXpert MRSA Assay (FDA approved for NASAL specimens only), is one component of a comprehensive MRSA colonization surveillance program. It is not intended to diagnose MRSA infection nor to guide or monitor treatment for MRSA infections.      Labs: BNP (last 3 results) No results for input(s): BNP in the last 8760 hours. Basic Metabolic Panel:  Recent Labs Lab 06/17/16 0837 06/19/16 0332 06/20/16 0316  NA 136 137 139  K 3.5 3.3* 3.5  CL 103 106 104  CO2 25 25 28   GLUCOSE 229* 180* 178*  BUN 10 8 11    CREATININE 1.00 0.74 0.87  CALCIUM 8.6* 8.3* 8.9   Liver Function Tests:  Recent Labs Lab 06/17/16 0837  AST 20  ALT 16*  ALKPHOS 77  BILITOT 0.5  PROT 7.6  ALBUMIN 3.8   No results for input(s): LIPASE, AMYLASE in the last 168 hours. No results for input(s): AMMONIA  in the last 168 hours. CBC:  Recent Labs Lab 06/17/16 0837  06/17/16 2345 06/18/16 0839 06/18/16 1350 06/19/16 0332 06/20/16 0316  WBC 8.6  --   --   --   --  8.2 9.0  NEUTROABS 5.4  --   --   --   --  4.7 5.4  HGB 12.4*  < > 10.4* 9.8* 10.2* 10.0* 10.7*  HCT 37.5*  < > 30.4* 28.8* 30.4* 29.6* 31.2*  MCV 84.3  --   --   --   --  81.3 82.8  PLT 290  --   --   --   --  218 245  < > = values in this interval not displayed. Cardiac Enzymes: No results for input(s): CKTOTAL, CKMB, CKMBINDEX, TROPONINI in the last 168 hours. BNP: Invalid input(s): POCBNP CBG:  Recent Labs Lab 06/19/16 0812 06/19/16 1148 06/19/16 1609 06/19/16 2132 06/20/16 0729  GLUCAP 200* 178* 203* 192* 194*   D-Dimer No results for input(s): DDIMER in the last 72 hours. Hgb A1c No results for input(s): HGBA1C in the last 72 hours. Lipid Profile No results for input(s): CHOL, HDL, LDLCALC, TRIG, CHOLHDL, LDLDIRECT in the last 72 hours. Thyroid function studies No results for input(s): TSH, T4TOTAL, T3FREE, THYROIDAB in the last 72 hours.  Invalid input(s): FREET3 Anemia work up No results for input(s): VITAMINB12, FOLATE, FERRITIN, TIBC, IRON, RETICCTPCT in the last 72 hours. Urinalysis    Component Value Date/Time   COLORURINE YELLOW 12/27/2015 0211   APPEARANCEUR CLEAR 12/27/2015 0211   LABSPEC 1.020 12/27/2015 0211   PHURINE 5.5 12/27/2015 0211   GLUCOSEU 100 (A) 12/27/2015 0211   HGBUR NEGATIVE 12/27/2015 0211   BILIRUBINUR NEGATIVE 12/27/2015 0211   KETONESUR NEGATIVE 12/27/2015 0211   PROTEINUR NEGATIVE 12/27/2015 0211   UROBILINOGEN 0.2 10/23/2012 0249   NITRITE NEGATIVE 12/27/2015 0211   LEUKOCYTESUR  NEGATIVE 12/27/2015 0211   Sepsis Labs Invalid input(s): PROCALCITONIN,  WBC,  LACTICIDVEN Microbiology Recent Results (from the past 240 hour(s))  MRSA PCR Screening     Status: None   Collection Time: 06/17/16  2:13 PM  Result Value Ref Range Status   MRSA by PCR NEGATIVE NEGATIVE Final    Comment:        The GeneXpert MRSA Assay (FDA approved for NASAL specimens only), is one component of a comprehensive MRSA colonization surveillance program. It is not intended to diagnose MRSA infection nor to guide or monitor treatment for MRSA infections.      Time coordinating discharge: 38 minutes  SIGNED:  Latrelle Dodrill, MD  [facility] 06/21/2016, 9:40 PM  Pager please text page via  [email or web address] Password [email or web address]

## 2016-06-22 ENCOUNTER — Encounter (HOSPITAL_COMMUNITY): Payer: Non-veteran care

## 2016-06-22 ENCOUNTER — Ambulatory Visit (HOSPITAL_COMMUNITY): Payer: Non-veteran care

## 2016-06-25 ENCOUNTER — Encounter (HOSPITAL_COMMUNITY): Admission: RE | Admit: 2016-06-25 | Payer: Non-veteran care | Source: Ambulatory Visit

## 2016-06-25 ENCOUNTER — Telehealth (HOSPITAL_COMMUNITY): Payer: Self-pay | Admitting: Cardiac Rehabilitation

## 2016-06-25 NOTE — Telephone Encounter (Signed)
pc to pt to discuss continued absence from cardiac rehab. LMOM

## 2016-06-27 ENCOUNTER — Encounter (HOSPITAL_COMMUNITY): Admission: RE | Admit: 2016-06-27 | Payer: Non-veteran care | Source: Ambulatory Visit

## 2016-06-29 ENCOUNTER — Encounter (HOSPITAL_COMMUNITY): Payer: Non-veteran care

## 2016-06-29 ENCOUNTER — Ambulatory Visit (HOSPITAL_COMMUNITY): Payer: Non-veteran care

## 2016-07-02 ENCOUNTER — Encounter (HOSPITAL_COMMUNITY): Admission: RE | Admit: 2016-07-02 | Payer: Non-veteran care | Source: Ambulatory Visit

## 2016-07-04 ENCOUNTER — Encounter (HOSPITAL_COMMUNITY): Payer: Non-veteran care

## 2016-07-06 ENCOUNTER — Encounter (HOSPITAL_COMMUNITY): Payer: Non-veteran care

## 2016-07-06 ENCOUNTER — Ambulatory Visit (HOSPITAL_COMMUNITY): Payer: Non-veteran care

## 2016-07-09 ENCOUNTER — Encounter (HOSPITAL_COMMUNITY): Payer: Non-veteran care

## 2016-07-11 ENCOUNTER — Emergency Department (HOSPITAL_BASED_OUTPATIENT_CLINIC_OR_DEPARTMENT_OTHER): Payer: Self-pay

## 2016-07-11 ENCOUNTER — Encounter (HOSPITAL_BASED_OUTPATIENT_CLINIC_OR_DEPARTMENT_OTHER): Payer: Self-pay | Admitting: Emergency Medicine

## 2016-07-11 ENCOUNTER — Observation Stay (HOSPITAL_BASED_OUTPATIENT_CLINIC_OR_DEPARTMENT_OTHER)
Admission: EM | Admit: 2016-07-11 | Discharge: 2016-07-12 | Disposition: A | Discharge status: Home / Self Care | Payer: Non-veteran care | Attending: Internal Medicine | Admitting: Internal Medicine

## 2016-07-11 DIAGNOSIS — Z79899 Other long term (current) drug therapy: Secondary | ICD-10-CM | POA: Insufficient documentation

## 2016-07-11 DIAGNOSIS — I214 Non-ST elevation (NSTEMI) myocardial infarction: Secondary | ICD-10-CM

## 2016-07-11 DIAGNOSIS — I2510 Atherosclerotic heart disease of native coronary artery without angina pectoris: Secondary | ICD-10-CM

## 2016-07-11 DIAGNOSIS — R079 Chest pain, unspecified: Secondary | ICD-10-CM

## 2016-07-11 DIAGNOSIS — I252 Old myocardial infarction: Secondary | ICD-10-CM | POA: Insufficient documentation

## 2016-07-11 DIAGNOSIS — R0789 Other chest pain: Principal | ICD-10-CM | POA: Diagnosis present

## 2016-07-11 DIAGNOSIS — I10 Essential (primary) hypertension: Secondary | ICD-10-CM | POA: Diagnosis present

## 2016-07-11 DIAGNOSIS — Z9861 Coronary angioplasty status: Secondary | ICD-10-CM

## 2016-07-11 DIAGNOSIS — Z7982 Long term (current) use of aspirin: Secondary | ICD-10-CM | POA: Insufficient documentation

## 2016-07-11 DIAGNOSIS — Z794 Long term (current) use of insulin: Secondary | ICD-10-CM | POA: Insufficient documentation

## 2016-07-11 DIAGNOSIS — Z87891 Personal history of nicotine dependence: Secondary | ICD-10-CM | POA: Insufficient documentation

## 2016-07-11 DIAGNOSIS — E119 Type 2 diabetes mellitus without complications: Secondary | ICD-10-CM

## 2016-07-11 DIAGNOSIS — Z955 Presence of coronary angioplasty implant and graft: Secondary | ICD-10-CM | POA: Insufficient documentation

## 2016-07-11 DIAGNOSIS — Z7984 Long term (current) use of oral hypoglycemic drugs: Secondary | ICD-10-CM | POA: Insufficient documentation

## 2016-07-11 HISTORY — DX: Personal history of urinary calculi: Z87.442

## 2016-07-11 HISTORY — DX: Post-traumatic stress disorder, unspecified: F43.10

## 2016-07-11 HISTORY — DX: Pure hypercholesterolemia, unspecified: E78.00

## 2016-07-11 HISTORY — DX: Type 2 diabetes mellitus without complications: E11.9

## 2016-07-11 HISTORY — DX: Gastrointestinal hemorrhage, unspecified: K92.2

## 2016-07-11 HISTORY — DX: Personal history of other medical treatment: Z92.89

## 2016-07-11 LAB — GLUCOSE, CAPILLARY: GLUCOSE-CAPILLARY: 187 mg/dL — AB (ref 65–99)

## 2016-07-11 LAB — CBC
HCT: 35.5 % — ABNORMAL LOW (ref 39.0–52.0)
Hemoglobin: 12 g/dL — ABNORMAL LOW (ref 13.0–17.0)
MCH: 28.2 pg (ref 26.0–34.0)
MCHC: 33.8 g/dL (ref 30.0–36.0)
MCV: 83.3 fL (ref 78.0–100.0)
PLATELETS: 228 10*3/uL (ref 150–400)
RBC: 4.26 MIL/uL (ref 4.22–5.81)
RDW: 14.5 % (ref 11.5–15.5)
WBC: 7.2 10*3/uL (ref 4.0–10.5)

## 2016-07-11 LAB — BASIC METABOLIC PANEL
Anion gap: 9 (ref 5–15)
BUN: 11 mg/dL (ref 6–20)
CHLORIDE: 101 mmol/L (ref 101–111)
CO2: 26 mmol/L (ref 22–32)
CREATININE: 0.91 mg/dL (ref 0.61–1.24)
Calcium: 9.2 mg/dL (ref 8.9–10.3)
GFR calc Af Amer: >60 mL/min (ref >60)
GFR calc non Af Amer: >60 mL/min (ref >60)
GLUCOSE: 186 mg/dL — AB (ref 65–99)
Potassium: 3.6 mmol/L (ref 3.5–5.1)
Sodium: 136 mmol/L (ref 135–145)

## 2016-07-11 LAB — TROPONIN I
TROPONIN I: 0.04 ng/mL — AB (ref <0.03)
TROPONIN I: 0.04 ng/mL — AB (ref <0.03)

## 2016-07-11 LAB — D-DIMER, QUANTITATIVE: D-Dimer, Quant: 0.45 ug/mL-FEU (ref 0.00–0.50)

## 2016-07-11 LAB — HEPARIN LEVEL (UNFRACTIONATED): Heparin Unfractionated: 0.2 IU/mL — ABNORMAL LOW (ref 0.30–0.70)

## 2016-07-11 MED ORDER — MORPHINE SULFATE (PF) 2 MG/ML IV SOLN
2.0000 mg | INTRAVENOUS | Status: DC | PRN
Start: 2016-07-11 — End: 2016-07-12
  Administered 2016-07-11: 2 mg via INTRAVENOUS
  Filled 2016-07-11: qty 1

## 2016-07-11 MED ORDER — ASPIRIN 81 MG PO CHEW: 324.0000 mg | CHEWABLE_TABLET | Freq: Once | ORAL | Status: DC

## 2016-07-11 MED ORDER — NITROGLYCERIN IN D5W 200-5 MCG/ML-% IV SOLN
0.0000 ug/min | Freq: Once | INTRAVENOUS | Status: DC
  Filled 2016-07-11: qty 250

## 2016-07-11 MED ORDER — ASPIRIN EC 81 MG PO TBEC
81.0000 mg | DELAYED_RELEASE_TABLET | Freq: Every day | ORAL | Status: DC
  Administered 2016-07-12: 81 mg via ORAL
  Filled 2016-07-11: qty 1

## 2016-07-11 MED ORDER — LISINOPRIL 10 MG PO TABS
10.0000 mg | ORAL_TABLET | Freq: Every day | ORAL | Status: DC
Start: 2016-07-11 — End: 2016-07-12
  Administered 2016-07-11 – 2016-07-12 (×2): 10 mg via ORAL
  Filled 2016-07-11 (×2): qty 1

## 2016-07-11 MED ORDER — PANTOPRAZOLE SODIUM 40 MG PO TBEC
40.0000 mg | DELAYED_RELEASE_TABLET | Freq: Every day | ORAL | Status: DC
  Administered 2016-07-11 – 2016-07-12 (×2): 40 mg via ORAL
  Filled 2016-07-11 (×2): qty 1

## 2016-07-11 MED ORDER — HEPARIN BOLUS VIA INFUSION
4000.0000 [IU] | Freq: Once | INTRAVENOUS | Status: AC
  Administered 2016-07-11: 4000 [IU] via INTRAVENOUS

## 2016-07-11 MED ORDER — ATORVASTATIN CALCIUM 80 MG PO TABS
80.0000 mg | ORAL_TABLET | Freq: Every day | ORAL | Status: DC
  Administered 2016-07-11 – 2016-07-12 (×2): 80 mg via ORAL
  Filled 2016-07-11 (×2): qty 1

## 2016-07-11 MED ORDER — AMLODIPINE BESYLATE 5 MG PO TABS
5.0000 mg | ORAL_TABLET | Freq: Every day | ORAL | Status: DC
  Administered 2016-07-11 – 2016-07-12 (×2): 5 mg via ORAL
  Filled 2016-07-11 (×2): qty 1

## 2016-07-11 MED ORDER — MORPHINE SULFATE (PF) 2 MG/ML IV SOLN
2.0000 mg | Freq: Once | INTRAVENOUS | Status: AC
  Administered 2016-07-11: 2 mg via INTRAVENOUS
  Filled 2016-07-11: qty 1

## 2016-07-11 MED ORDER — ONDANSETRON HCL 4 MG/2ML IJ SOLN: 4.0000 mg | Freq: Four times a day (QID) | INTRAMUSCULAR | Status: DC | PRN

## 2016-07-11 MED ORDER — HEPARIN (PORCINE) IN NACL 100-0.45 UNIT/ML-% IJ SOLN: 14.0000 [IU]/kg/h | Freq: Once | INTRAMUSCULAR | Status: DC

## 2016-07-11 MED ORDER — NITROGLYCERIN 0.4 MG SL SUBL
0.4000 mg | SUBLINGUAL_TABLET | SUBLINGUAL | Status: DC | PRN
  Administered 2016-07-11 (×3): 0.4 mg via SUBLINGUAL
  Filled 2016-07-11: qty 1

## 2016-07-11 MED ORDER — CARVEDILOL 3.125 MG PO TABS
3.1250 mg | ORAL_TABLET | Freq: Two times a day (BID) | ORAL | Status: DC
  Administered 2016-07-11 – 2016-07-12 (×3): 3.125 mg via ORAL
  Filled 2016-07-11 (×3): qty 1

## 2016-07-11 MED ORDER — HEPARIN (PORCINE) IN NACL 100-0.45 UNIT/ML-% IJ SOLN
1250.0000 [IU]/h | INTRAMUSCULAR | Status: DC
  Administered 2016-07-11: 1100 [IU]/h via INTRAVENOUS
  Filled 2016-07-11: qty 250

## 2016-07-11 MED ORDER — INSULIN ASPART 100 UNIT/ML ~~LOC~~ SOLN
0.0000 [IU] | Freq: Three times a day (TID) | SUBCUTANEOUS | Status: DC
  Administered 2016-07-12 (×2): 3 [IU] via SUBCUTANEOUS

## 2016-07-11 MED ORDER — ONDANSETRON HCL 4 MG/2ML IJ SOLN
4.0000 mg | Freq: Once | INTRAMUSCULAR | Status: AC
  Administered 2016-07-11: 4 mg via INTRAVENOUS
  Filled 2016-07-11: qty 2

## 2016-07-11 MED ORDER — HEPARIN SODIUM (PORCINE) 5000 UNIT/ML IJ SOLN: 4000.0000 [IU] | Freq: Once | INTRAMUSCULAR | Status: DC

## 2016-07-11 MED ORDER — CLOPIDOGREL BISULFATE 75 MG PO TABS
75.0000 mg | ORAL_TABLET | Freq: Every day | ORAL | Status: DC
  Administered 2016-07-11 – 2016-07-12 (×2): 75 mg via ORAL
  Filled 2016-07-11 (×2): qty 1

## 2016-07-11 MED ORDER — SODIUM CHLORIDE 0.9 % IV SOLN
INTRAVENOUS | Status: DC
  Administered 2016-07-11: 14:00:00 via INTRAVENOUS

## 2016-07-11 MED ORDER — ACETAMINOPHEN 325 MG PO TABS: 650.0000 mg | ORAL_TABLET | ORAL | Status: DC | PRN

## 2016-07-11 NOTE — Progress Notes (Signed)
ANTICOAGULATION CONSULT NOTE - Initial Consult  Pharmacy Consult for heparin Indication: chest pain/ACS  No Known Allergies  Patient Measurements: Height: 5\' 7"  (170.2 cm) Weight: 194 lb (88 kg) IBW/kg (Calculated) : 66.1 Heparin Dosing Weight: 84kg  Vital Signs: Temp: 99.1 F (37.3 C) (04/11 1016) Temp Source: Oral (04/11 1016) BP: 181/94 (04/11 1300) Pulse Rate: 89 (04/11 1300)  Labs:  Recent Labs  07/11/16 1032  HGB 12.0*  HCT 35.5*  PLT 228  CREATININE 0.91  TROPONINI 0.04*    Estimated Creatinine Clearance: 96 mL/min (by C-G formula based on SCr of 0.91 mg/dL).   Medical History: Past Medical History:  Diagnosis Date  . Diabetes (HCC)   . Diverticulosis   . GERD (gastroesophageal reflux disease)   . Heart attack 2011   2 stents in [facility]  . Heart murmur   . Hemorrhoids   . Hyperlipidemia due to type 2 diabetes mellitus (HCC)   . Hypertension   . Stented coronary artery 2011    Medications:  Infusions:  . sodium chloride    . heparin      Assessment: 55 yom presented to the ED with CP. Baseline H/H slightly low and platelets are WNL. D-dimer and troponin are elevated. He is not on anticoagulation PTA.   Goal of Therapy:  Heparin level 0.3-0.7 units/ml Monitor platelets by anticoagulation protocol: Yes   Plan:  Heparin bolus 4000 units IV x 1 Heparin gtt 1100 units/hr Check a 6 hr heparin level Daily heparin level and CBC  Makoto Sellitto, Drake Leach 07/11/2016,1:06 PM

## 2016-07-11 NOTE — ED Triage Notes (Addendum)
Patient reports chest pain which began at 0230 this morning.  States that the pain gets worse when moving certain ways.  States that he also has pain on inspiration.  Denies tenderness on palpation.  Pt ambulatory to triage room in NAD.

## 2016-07-11 NOTE — Progress Notes (Signed)
   Patient coming from [facility] for evaluation of chest pain and ACS rule out. Of note patient with extensive cardiac history including recent cardiac catheterization and stent placement 2. Patient also with history of GI bleed which was treated in March 2018. Patient reports awaking at 02:30 with left-sided chest pain which is somewhat atypical in nature due to association with certain movements and deep breathing. Patient did have improvement in pain with administration of nitroglycerin. Patient without overt ACS signs on EKG but with a minimally elevated troponin 0.04. Patient started on a heparin drip and cardiology was consult. Radiology felt that patient be appropriate for medicine admission as chest pain is fairly atypical. Patient accepted to a stepdown bed due to ongoing chest pain. If pain improves patient may be downgraded to telemetry bed under observation status on [facility] service. Second troponin is pending  Shelly Flatten, MD [facility] 07/11/2016, 1:58 PM

## 2016-07-11 NOTE — ED Notes (Signed)
IV attempted x2 without success.

## 2016-07-11 NOTE — ED Provider Notes (Addendum)
[facility] Provider Note   CSN: [id number] Arrival date & time: 07/11/16  4098     History   Chief Complaint Chief Complaint  Patient presents with  . Chest Pain    HPI Antonio [name] is a 56 y.o. male.  Patient known coronary artery disease had 2 stents placed in September. Had a non-STEMI at that time. Patient with left-sided chest pain started 2:30 this morning. Somewhat atypical for his past pain. Made a low bit worse with movement of the left arm and taking a deep breath. No nausea no vomiting no shortness of breath. Patient denies any swelling in his legs. Pain does not radiate.      Past Medical History:  Diagnosis Date  . Diabetes (HCC)   . Diverticulosis   . GERD (gastroesophageal reflux disease)   . Heart attack 2011   2 stents in [facility]  . Heart murmur   . Hemorrhoids   . Hyperlipidemia due to type 2 diabetes mellitus (HCC)   . Hypertension   . Stented coronary artery 2011    Patient Active Problem List   Diagnosis Date Noted  . Acute GI bleeding 06/17/2016  . GI bleed 06/17/2016  . Vasovagal near syncope   . CAD S/P percutaneous coronary angioplasty   . Hypotension 12/26/2015  . AKI (acute kidney injury) (HCC) 12/26/2015  . NSTEMI (non-ST elevated myocardial infarction) (HCC) 12/22/2015  . Essential hypertension   . Hyperlipidemia due to type 2 diabetes mellitus (HCC)   . Perineal abscess 10/27/2012  . Type 2 diabetes mellitus without complication, without long-term current use of insulin (HCC) 10/19/2012  . Acute lower GI bleeding 10/16/2012  . Diverticulosis 10/16/2012  . Acute blood loss anemia 10/16/2012  . Coronary artery disease due to lipid rich plaque 10/16/2012    Past Surgical History:  Procedure Laterality Date  . CARDIAC CATHETERIZATION N/A 12/23/2015   Procedure: Left Heart Cath and Coronary Angiography;  Surgeon: Peter M Swaziland, MD;  Location: [facility] INVASIVE CV LAB;  Service: Cardiovascular;  Laterality: N/A;  . CARDIAC  CATHETERIZATION N/A 12/23/2015   Procedure: Coronary Stent Intervention;  Surgeon: Peter M Swaziland, MD;  Location: [facility] INVASIVE CV LAB;  Service: Cardiovascular;  Laterality: N/A;  . COLON RESECTION     12 inches taken out in 2011 at [facility] for diverticulosis  . COLONOSCOPY N/A 10/18/2012   Procedure: COLONOSCOPY;  Surgeon: Louis Meckel, MD;  Location: [facility] ENDOSCOPY;  Service: Endoscopy;  Laterality: N/A;  . COLONOSCOPY N/A 10/22/2012   Procedure: COLONOSCOPY;  Surgeon: Iva Boop, MD;  Location: [facility] ENDOSCOPY;  Service: Endoscopy;  Laterality: N/A;  . CORONARY ANGIOPLASTY WITH STENT PLACEMENT  2011   at [facility], 2 stents, locations unknown  . ESOPHAGOGASTRODUODENOSCOPY N/A 10/18/2012   Procedure: ESOPHAGOGASTRODUODENOSCOPY (EGD);  Surgeon: Louis Meckel, MD;  Location: [facility] ENDOSCOPY;  Service: Endoscopy;  Laterality: N/A;  . HERNIA REPAIR    . IRRIGATION AND DEBRIDEMENT ABSCESS N/A 10/26/2012   Procedure: IRRIGATION AND DEBRIDEMENT PERINEAL ABSCESS;  Surgeon: Wilmon Arms. Corliss Skains, MD;  Location: [facility] OR;  Service: General;  Laterality: N/A;       Home Medications    Prior to Admission medications   Medication Sig Start Date End Date Taking? Authorizing Provider  meloxicam (MOBIC) 7.5 MG tablet Take 7.5 mg by mouth daily.   Yes Historical Provider, MD  amLODipine (NORVASC) 5 MG tablet Take 1 tablet (5 mg total) by mouth daily. 01/23/16   Jake Bathe, MD  aspirin EC 81 MG tablet Take 1 tablet (81 mg total) by mouth daily. 12/24/15   Little Ishikawa, NP  atorvastatin (LIPITOR) 80 MG tablet Take 1 tablet (80 mg total) by mouth daily at 6 PM. 01/23/16   Jake Bathe, MD  carvedilol (COREG) 3.125 MG tablet Take 1 tablet (3.125 mg total) by mouth 2 (two) times daily with a meal. 01/23/16   Jake Bathe, MD  clopidogrel (PLAVIX) 75 MG tablet Take 1 tablet (75 mg total) by mouth daily. 01/23/16   Jake Bathe, MD  docusate sodium (COLACE) 100 MG capsule Take 100 mg by mouth  daily as needed for mild constipation.    Historical Provider, MD  insulin aspart protamine - aspart (NOVOLOG MIX 70/30 FLEXPEN) (70-30) 100 UNIT/ML FlexPen Inject 0.35 mLs (35 Units total) into the skin 2 (two) times daily before a meal. 06/20/16   Lenox Ponds, MD  lisinopril (PRINIVIL,ZESTRIL) 10 MG tablet Take 1 tablet (10 mg total) by mouth daily. 12/30/15 06/17/16  Rosalio Macadamia, NP  metFORMIN (GLUCOPHAGE-XR) 500 MG 24 hr tablet Take 1,000 mg by mouth 2 (two) times daily.    Historical Provider, MD  nitroGLYCERIN (NITROSTAT) 0.4 MG SL tablet Place 0.4 mg under the tongue every 5 (five) minutes as needed for chest pain.    Historical Provider, MD  ondansetron (ZOFRAN) 8 MG tablet Take 8 mg by mouth every 8 (eight) hours as needed for nausea or vomiting.    Historical Provider, MD  pantoprazole (PROTONIX) 40 MG tablet Take 40 mg by mouth daily as needed. For heartburn    Historical Provider, MD    Family History Family History  Problem Relation Age of Onset  . Alzheimer's disease Mother 64  . Heart attack Father 7  . CAD Brother 40    triple bypass    Social History Social History  Substance Use Topics  . Smoking status: Former Smoker    Types: Cigarettes    Quit date: 10/19/2002  . Smokeless tobacco: Never Used  . Alcohol use Yes     Comment: socially, one-2 drinks/month     Allergies   Patient has no known allergies.   Review of Systems Review of Systems  Constitutional: Negative for fever.  HENT: Negative for congestion.   Eyes: Negative for visual disturbance.  Respiratory: Negative for shortness of breath.   Cardiovascular: Positive for chest pain.  Gastrointestinal: Negative for abdominal pain, blood in stool, nausea and vomiting.  Genitourinary: Negative for dysuria.  Musculoskeletal: Negative for back pain.  Skin: Negative for rash.  Neurological: Negative for headaches.  Hematological: Does not bruise/bleed easily.  Psychiatric/Behavioral: Negative for  confusion.     Physical Exam Updated Vital Signs BP (!) 181/94   Pulse 89   Temp 99.1 F (37.3 C) (Oral)   Resp (!) 21   Ht 5\' 7"  (1.702 m)   Wt 88 kg   SpO2 100%   BMI 30.38 kg/m   Physical Exam  Constitutional: He is oriented to person, place, and time. He appears well-developed and well-nourished. No distress.  HENT:  Head: Normocephalic and atraumatic.  Mouth/Throat: Oropharynx is clear and moist.  Eyes: EOM are normal. Pupils are equal, round, and reactive to light.  Neck: Normal range of motion. Neck supple.  Cardiovascular: Normal rate, regular rhythm and normal heart sounds.   Pulmonary/Chest: Effort normal and breath sounds normal. No respiratory distress. He exhibits no tenderness.  Abdominal: Soft. Bowel sounds are normal. There is no  tenderness.  Musculoskeletal: Normal range of motion. He exhibits no edema.  Neurological: He is alert and oriented to person, place, and time. No cranial nerve deficit or sensory deficit. He exhibits normal muscle tone. Coordination normal.  Skin: Skin is warm.  Nursing note and vitals reviewed.    ED Treatments / Results  Labs (all labs ordered are listed, but only abnormal results are displayed) Labs Reviewed  BASIC METABOLIC PANEL - Abnormal; Notable for the following:       Result Value   Glucose, Bld 186 (*)    All other components within normal limits  CBC - Abnormal; Notable for the following:    Hemoglobin 12.0 (*)    HCT 35.5 (*)    All other components within normal limits  TROPONIN I - Abnormal; Notable for the following:    Troponin I 0.04 (*)    All other components within normal limits  D-DIMER, QUANTITATIVE (NOT AT [facility])  TROPONIN I    EKG  EKG Interpretation  Date/Time:  Wednesday July 11 2016 10:07:35 EDT Ventricular Rate:  61 PR Interval:    QRS Duration: 90 QT Interval:  406 QTC Calculation: 409 R Axis:   16 Text Interpretation:  Sinus rhythm Nonspecific T abnormalities, lateral leads  Borderline ST elevation, anterior leads Baseline wander in lead(s) V6 anterior leads unchanged No significant change since last tracing Reconfirmed by Isela Stantz  MD, Kimorah Ridolfi ([id number]) on 07/11/2016 10:11:31 AM Also confirmed by Deretha Emory  MD, Sneijder Bernards [phone]), editor Misty Stanley [phone])  on 07/11/2016 10:26:48 AM       Radiology Dg Chest 2 View  Result Date: 07/11/2016 CLINICAL DATA:  LEFT-sided chest pain beginning earlier today. EXAM: CHEST  2 VIEW COMPARISON:  12/26/2015. FINDINGS: The heart size and mediastinal contours are within normal limits. Both lungs are clear. The visualized skeletal structures are unremarkable. IMPRESSION: No active cardiopulmonary disease. Electronically Signed   By: Elsie Stain M.D.   On: 07/11/2016 10:51    Procedures Procedures (including critical care time)  CRITICAL CARE Performed by: Vanetta Mulders Total critical care time: 45 minutes Critical care time was exclusive of separately billable procedures and treating other patients. Critical care was necessary to treat or prevent imminent or life-threatening deterioration. Critical care was time spent personally by me on the following activities: development of treatment plan with patient and/or surrogate as well as nursing, discussions with consultants, evaluation of patient's response to treatment, examination of patient, obtaining history from patient or surrogate, ordering and performing treatments and interventions, ordering and review of laboratory studies, ordering and review of radiographic studies, pulse oximetry and re-evaluation of patient's condition.   Medications Ordered in ED Medications  aspirin chewable tablet 324 mg (324 mg Oral Not Given 07/11/16 1237)  0.9 %  sodium chloride infusion (not administered)  heparin bolus via infusion 4,000 Units (not administered)  heparin ADULT infusion 100 units/mL (25000 units/215mL sodium chloride 0.45%) (not administered)  morphine 2 MG/ML injection  2 mg (2 mg Intravenous Given 07/11/16 1304)  ondansetron (ZOFRAN) injection 4 mg (4 mg Intravenous Given 07/11/16 1303)     Initial Impression / Assessment and Plan / ED Course  I have reviewed the triage vital signs and the nursing notes.  Pertinent labs & imaging results that were available during my care of the patient were reviewed by me and considered in my medical decision making (see chart for details).    Patient with known coronary artery disease. Patient also with admission in March for GI bleed. But  no active bleeding currently. Source of bleeding never completely identified at that time. Patient with onset of chest pain at 2:30 this morning. Not associated with nausea vomiting. Not associated with shortness of breath. Made worse with some movement of the left arm and with taking a deep breath.  First troponin slightly elevated at 0.04. D-dimer negative. Chest x-ray without acute findings. Patient had stents and known coronary disease. Recently had 2 stents placed in September 2017.  Patient will be started on heparin as a potential non-STEMI. Can be stopped if he starts having GI bleed. OR IF repeat troponin goes down.   Patient's chest pain is somewhat atypical but in face of the slightly elevated troponin and his past history. Will treat as a non-STEMI. We'll start heparin. We'll discuss with cardiology for admission.   Second troponin is pending.     Final Clinical Impressions(s) / ED Diagnoses   Final diagnoses:  NSTEMI (non-ST elevated myocardial infarction) [facility])    New Prescriptions New Prescriptions   No medications on file     Vanetta Mulders, MD 07/11/16 1318   Patient excepted to step down bed by triad hospitalist to [facility]. Still waiting for bed availability. Patient's repeat troponin was the same 0.04 which is reassuring. Patient still with persistent chest pain. Not resolved completely but improved from 8 out of 10 down to 3 out of 10. With morphine and  nitroglycerin.  Transfer paperwork completed.    Vanetta Mulders, MD 07/11/16 [phone]

## 2016-07-11 NOTE — ED Notes (Addendum)
Date and time results received: 07/11/16 2:03 PM   Test: Troponin Critical Value: 0.04  Name of Provider Notified: Zackowski  Orders Received? Or Actions Taken?: MD notified. No orders given

## 2016-07-11 NOTE — Progress Notes (Addendum)
ANTICOAGULATION CONSULT NOTE   Pharmacy Consult for heparin Indication: chest pain/ACS  No Known Allergies  Patient Measurements: Height: 5\' 7"  (170.2 cm) Weight: 197 lb 6.4 oz (89.5 kg) IBW/kg (Calculated) : 66.1 Heparin Dosing Weight: 84kg  Vital Signs: Temp: 98.4 F (36.9 C) (04/11 2026) Temp Source: Oral (04/11 2026) BP: 156/85 (04/11 2026) Pulse Rate: 70 (04/11 2026)  Labs:  Recent Labs  07/11/16 1032 07/11/16 1322 07/11/16 1815 07/11/16 2029  HGB 12.0*  --   --   --   HCT 35.5*  --   --   --   PLT 228  --   --   --   HEPARINUNFRC  --   --   --  0.20*  CREATININE 0.91  --   --   --   TROPONINI 0.04* 0.04* <0.03  --     Estimated Creatinine Clearance: 96.8 mL/min (by C-G formula based on SCr of 0.91 mg/dL).   Medical History: Past Medical History:  Diagnosis Date  . Diverticulosis   . GERD (gastroesophageal reflux disease)   . Heart attack 2011; 2017  . Heart murmur   . Hemorrhoids   . High cholesterol   . History of blood transfusion 2011; 2017; 05/2016   "elated to diverticulitis"  . History of kidney stones   . Hyperlipidemia due to type 2 diabetes mellitus (HCC)   . Hypertension   . Lower GI bleed 2011; 2017; 05/2016  . PTSD (post-traumatic stress disorder)   . Type II diabetes mellitus (HCC)     Medications:  Infusions:  . heparin 1,100 Units/hr (07/11/16 1330)    Assessment: 55 yom presented to the ED with CP. Baseline H/H slightly low and platelets are WNL. D-dimer and troponin are elevated. He is not on anticoagulation PTA.  Initial heparin level of 0.20 is below desired range of 0.3 -0.7. No issues with infusion or sxs of bleeding.   Goal of Therapy:  Heparin level 0.3-0.7 units/ml Monitor platelets by anticoagulation protocol: Yes   Plan:  Increase heparin infusion to 1250 units/hr Heparin level with am labs Daily heparin level and CBC  Sheron Nightingale 07/11/2016,9:37 PM

## 2016-07-11 NOTE — ED Notes (Signed)
IV attempt x1 without success, able to get labs.  RN aware

## 2016-07-11 NOTE — H&P (Addendum)
History and Physical    Antonio [name] ZOX:[id number] DOB: Apr 23, 1960 DOA: 07/11/2016  PCP: [facility]  Patient coming from: Home[facility]   Chief Complaint: chest pain  HPI: Antonio [name] is a 56 y.o. male with medical history significant of CAD with 2 stent placement, diverticulosis, GERD, diabetes mellitus, hypertension, hyperlipidemia, and recent admission for diverticular bleed who presented to [facility] with chest pain. Patient reports awaking at 02:30 with left-sided chest pain which is somewhat atypical in nature due to association with certain movements and deep breathing. He describes it as a dull ache, worse when he rotates, bends forward, takes deep breaths. It is not reproducible. Patient reports no improvement with nitro, but did improve with morphine. Due to patient's risk factors, he was started on a heparin drip, nitro drip, and cardiology was consult by EDP.   Review of Systems: As per HPI otherwise 10 point review of systems negative.   Past Medical History:  Diagnosis Date  . Diabetes (HCC)   . Diverticulosis   . GERD (gastroesophageal reflux disease)   . Heart attack 2011   2 stents in [facility]  . Heart murmur   . Hemorrhoids   . Hyperlipidemia due to type 2 diabetes mellitus (HCC)   . Hypertension   . Stented coronary artery 2011    Past Surgical History:  Procedure Laterality Date  . CARDIAC CATHETERIZATION N/A 12/23/2015   Procedure: Left Heart Cath and Coronary Angiography;  Surgeon: Peter M Swaziland, MD;  Location: [facility] INVASIVE CV LAB;  Service: Cardiovascular;  Laterality: N/A;  . CARDIAC CATHETERIZATION N/A 12/23/2015   Procedure: Coronary Stent Intervention;  Surgeon: Peter M Swaziland, MD;  Location: [facility] INVASIVE CV LAB;  Service: Cardiovascular;  Laterality: N/A;  . COLON RESECTION     12 inches taken out in 2011 at [facility] for diverticulosis  . COLONOSCOPY N/A 10/18/2012   Procedure: COLONOSCOPY;  Surgeon: Louis Meckel, MD;   Location: [facility] ENDOSCOPY;  Service: Endoscopy;  Laterality: N/A;  . COLONOSCOPY N/A 10/22/2012   Procedure: COLONOSCOPY;  Surgeon: Iva Boop, MD;  Location: [facility] ENDOSCOPY;  Service: Endoscopy;  Laterality: N/A;  . CORONARY ANGIOPLASTY WITH STENT PLACEMENT  2011   at [facility], 2 stents, locations unknown  . ESOPHAGOGASTRODUODENOSCOPY N/A 10/18/2012   Procedure: ESOPHAGOGASTRODUODENOSCOPY (EGD);  Surgeon: Louis Meckel, MD;  Location: [facility] ENDOSCOPY;  Service: Endoscopy;  Laterality: N/A;  . HERNIA REPAIR    . IRRIGATION AND DEBRIDEMENT ABSCESS N/A 10/26/2012   Procedure: IRRIGATION AND DEBRIDEMENT PERINEAL ABSCESS;  Surgeon: Wilmon Arms. Corliss Skains, MD;  Location: [facility] OR;  Service: General;  Laterality: N/A;     reports that he quit smoking about 13 years ago. His smoking use included Cigarettes. He has never used smokeless tobacco. He reports that he drinks alcohol. He reports that he uses drugs, including Marijuana.  No Known Allergies  Family History  Problem Relation Age of Onset  . Alzheimer's disease Mother 38  . Heart attack Father 58  . CAD Brother 40    triple bypass    Prior to Admission medications   Medication Sig Start Date End Date Taking? Authorizing Provider  meloxicam (MOBIC) 7.5 MG tablet Take 7.5 mg by mouth daily.   Yes Historical Provider, MD  amLODipine (NORVASC) 5 MG tablet Take 1 tablet (5 mg total) by mouth daily. 01/23/16   Jake Bathe, MD  aspirin EC 81 MG tablet Take 1 tablet (81 mg total) by mouth daily.  12/24/15   Little Ishikawa, NP  atorvastatin (LIPITOR) 80 MG tablet Take 1 tablet (80 mg total) by mouth daily at 6 PM. 01/23/16   Jake Bathe, MD  carvedilol (COREG) 3.125 MG tablet Take 1 tablet (3.125 mg total) by mouth 2 (two) times daily with a meal. 01/23/16   Jake Bathe, MD  clopidogrel (PLAVIX) 75 MG tablet Take 1 tablet (75 mg total) by mouth daily. 01/23/16   Jake Bathe, MD  docusate sodium (COLACE) 100 MG capsule Take 100 mg by mouth  daily as needed for mild constipation.    Historical Provider, MD  insulin aspart protamine - aspart (NOVOLOG MIX 70/30 FLEXPEN) (70-30) 100 UNIT/ML FlexPen Inject 0.35 mLs (35 Units total) into the skin 2 (two) times daily before a meal. 06/20/16   Lenox Ponds, MD  lisinopril (PRINIVIL,ZESTRIL) 10 MG tablet Take 1 tablet (10 mg total) by mouth daily. 12/30/15 06/17/16  Rosalio Macadamia, NP  metFORMIN (GLUCOPHAGE-XR) 500 MG 24 hr tablet Take 1,000 mg by mouth 2 (two) times daily.    Historical Provider, MD  nitroGLYCERIN (NITROSTAT) 0.4 MG SL tablet Place 0.4 mg under the tongue every 5 (five) minutes as needed for chest pain.    Historical Provider, MD  ondansetron (ZOFRAN) 8 MG tablet Take 8 mg by mouth every 8 (eight) hours as needed for nausea or vomiting.    Historical Provider, MD  pantoprazole (PROTONIX) 40 MG tablet Take 40 mg by mouth daily as needed. For heartburn    Historical Provider, MD    Physical Exam: Vitals:   07/11/16 1430 07/11/16 1500 07/11/16 1530 07/11/16 1651  BP: (!) 152/93 138/86 (!) 148/90 (!) 163/81  Pulse: 68 79 71 68  Resp: 13 14 16 16   Temp:    98.2 F (36.8 C)  TempSrc:    Oral  SpO2: 98% 100% 100% 100%  Weight:    89.5 kg (197 lb 6.4 oz)  Height:    5\' 7"  (1.702 m)    Constitutional: NAD, calm, comfortable Eyes: PERRL, lids and conjunctivae normal ENMT: Mucous membranes are moist. Posterior pharynx clear of any exudate or lesions.Normal dentition.  Neck: normal, supple, no masses, no thyromegaly Respiratory: clear to auscultation bilaterally, no wheezing, no crackles. Normal respiratory effort. No accessory muscle use.  Cardiovascular: Regular rate and rhythm, no murmurs / rubs / gallops. No extremity edema.  Abdomen: no tenderness, no masses palpated. No hepatosplenomegaly. Bowel sounds positive.  Musculoskeletal: no clubbing / cyanosis. No joint deformity upper and lower extremities. Good ROM, no contractures. Normal muscle tone.  Skin: no rashes,  lesions, ulcers. No induration Neurologic: CN 2-12 grossly intact. Strength 5/5 in all 4.  Psychiatric: Normal judgment and insight. Alert and oriented x 3. Normal mood.   Labs on Admission: I have personally reviewed following labs and imaging studies  CBC:  Recent Labs Lab 07/11/16 1032  WBC 7.2  HGB 12.0*  HCT 35.5*  MCV 83.3  PLT 228   Basic Metabolic Panel:  Recent Labs Lab 07/11/16 1032  NA 136  K 3.6  CL 101  CO2 26  GLUCOSE 186*  BUN 11  CREATININE 0.91  CALCIUM 9.2   GFR: Estimated Creatinine Clearance: 96.8 mL/min (by C-G formula based on SCr of 0.91 mg/dL). Liver Function Tests: No results for input(s): AST, ALT, ALKPHOS, BILITOT, PROT, ALBUMIN in the last 168 hours. No results for input(s): LIPASE, AMYLASE in the last 168 hours. No results for input(s): AMMONIA in the last  168 hours. Coagulation Profile: No results for input(s): INR, PROTIME in the last 168 hours. Cardiac Enzymes:  Recent Labs Lab 07/11/16 1032 07/11/16 1322  TROPONINI 0.04* 0.04*   BNP (last 3 results) No results for input(s): PROBNP in the last 8760 hours. HbA1C: No results for input(s): HGBA1C in the last 72 hours. CBG: No results for input(s): GLUCAP in the last 168 hours. Lipid Profile: No results for input(s): CHOL, HDL, LDLCALC, TRIG, CHOLHDL, LDLDIRECT in the last 72 hours. Thyroid Function Tests: No results for input(s): TSH, T4TOTAL, FREET4, T3FREE, THYROIDAB in the last 72 hours. Anemia Panel: No results for input(s): VITAMINB12, FOLATE, FERRITIN, TIBC, IRON, RETICCTPCT in the last 72 hours. Urine analysis:    Component Value Date/Time   COLORURINE YELLOW 12/27/2015 0211   APPEARANCEUR CLEAR 12/27/2015 0211   LABSPEC 1.020 12/27/2015 0211   PHURINE 5.5 12/27/2015 0211   GLUCOSEU 100 (A) 12/27/2015 0211   HGBUR NEGATIVE 12/27/2015 0211   BILIRUBINUR NEGATIVE 12/27/2015 0211   KETONESUR NEGATIVE 12/27/2015 0211   PROTEINUR NEGATIVE 12/27/2015 0211    UROBILINOGEN 0.2 10/23/2012 0249   NITRITE NEGATIVE 12/27/2015 0211   LEUKOCYTESUR NEGATIVE 12/27/2015 0211   Sepsis Labs: !!!!!!!!!!!!!!!!!!!!!!!!!!!!!!!!!!!!!!!!!!!! @LABRCNTIP (procalcitonin:4,lacticidven:4) )No results found for this or any previous visit (from the past 240 hour(s)).   Radiological Exams on Admission: Dg Chest 2 View  Result Date: 07/11/2016 CLINICAL DATA:  LEFT-sided chest pain beginning earlier today. EXAM: CHEST  2 VIEW COMPARISON:  12/26/2015. FINDINGS: The heart size and mediastinal contours are within normal limits. Both lungs are clear. The visualized skeletal structures are unremarkable. IMPRESSION: No active cardiopulmonary disease. Electronically Signed   By: Elsie Stain M.D.   On: 07/11/2016 10:51    EKG: Independently reviewed. NSR, T inversion lateral leads, no significant changes since previous in 2017.   Assessment/Plan Principal Problem:   Atypical chest pain Active Problems:   DM type 2 (diabetes mellitus, type 2) (HCC)   Essential hypertension   CAD S/P percutaneous coronary angioplasty   Atypical chest pain -Hx CAD with stent placement in 12/23/2015. It is dull in nature, left-sided, worse with rotating, bending forward, deep breaths. Morphine helped from 8/10 to 3/10 -Heart score 3  -CXR unremarkable  -EKG without ST elevation or changes since previous. Repeat EKG in AM  -Heparin gtt started by ED, continue until r/o ACS  -Trend troponin -Cardiology consult if troponin positive -ASA and Plavix, lipitor  -Morphine, nitro prn   Essential HTN -Continue norvasc, coreg, lisinopril  DM type 2 -Hold metformin while in hospital  -SSI   Recent diverticular bleed -Monitor CBC. Patient states lower GI bleed as resolved since discharge from hospital. Hgb stable.   GERD -PPI   DVT prophylaxis: heparin gtt Code Status: Full  Family Communication: at bedside Disposition Plan: pending work up and stabilization  Consults called: none at  time of admission  Admission status: Observation, stepdown unit    Noralee Stain, DO [facility] [email or web address] Password [facility] 07/11/2016, 5:41 PM

## 2016-07-11 NOTE — ED Notes (Signed)
Date and time results received: 07/11/16 1122 (use smartphrase ".now" to insert current time)  Test: trp Critical Value: 0.04  Name of Provider Notified: Zackowski  Orders Received? Or Actions Taken?: no orders given

## 2016-07-12 ENCOUNTER — Observation Stay (HOSPITAL_BASED_OUTPATIENT_CLINIC_OR_DEPARTMENT_OTHER): Payer: Non-veteran care

## 2016-07-12 DIAGNOSIS — R079 Chest pain, unspecified: Secondary | ICD-10-CM

## 2016-07-12 DIAGNOSIS — R0789 Other chest pain: Secondary | ICD-10-CM | POA: Diagnosis not present

## 2016-07-12 LAB — BASIC METABOLIC PANEL
Anion gap: 10 (ref 5–15)
BUN: 10 mg/dL (ref 6–20)
CHLORIDE: 102 mmol/L (ref 101–111)
CO2: 25 mmol/L (ref 22–32)
Calcium: 8.9 mg/dL (ref 8.9–10.3)
Creatinine, Ser: 0.94 mg/dL (ref 0.61–1.24)
GFR calc Af Amer: >60 mL/min (ref >60)
Glucose, Bld: 153 mg/dL — ABNORMAL HIGH (ref 65–99)
Potassium: 3.4 mmol/L — ABNORMAL LOW (ref 3.5–5.1)
Sodium: 137 mmol/L (ref 135–145)

## 2016-07-12 LAB — CBC
HCT: 33.6 % — ABNORMAL LOW (ref 39.0–52.0)
HEMOGLOBIN: 10.9 g/dL — AB (ref 13.0–17.0)
MCH: 27.1 pg (ref 26.0–34.0)
MCHC: 32.4 g/dL (ref 30.0–36.0)
MCV: 83.6 fL (ref 78.0–100.0)
Platelets: 262 10*3/uL (ref 150–400)
RBC: 4.02 MIL/uL — AB (ref 4.22–5.81)
RDW: 14.6 % (ref 11.5–15.5)
WBC: 8 10*3/uL (ref 4.0–10.5)

## 2016-07-12 LAB — GLUCOSE, CAPILLARY
GLUCOSE-CAPILLARY: 223 mg/dL — AB (ref 65–99)
GLUCOSE-CAPILLARY: 171 mg/dL — AB (ref 65–99)
GLUCOSE-CAPILLARY: 179 mg/dL — AB (ref 65–99)
GLUCOSE-CAPILLARY: 151 mg/dL — AB (ref 65–99)

## 2016-07-12 LAB — HEPARIN LEVEL (UNFRACTIONATED)
HEPARIN UNFRACTIONATED: 0.25 [IU]/mL — AB (ref 0.30–0.70)
Heparin Unfractionated: 0.42 IU/mL (ref 0.30–0.70)

## 2016-07-12 LAB — NM MYOCAR MULTI W/SPECT W/WALL MOTION / EF
CSEPPHR: 95 {beats}/min
Rest HR: 95 {beats}/min

## 2016-07-12 LAB — TROPONIN I: Troponin I: <0.03 ng/mL (ref <0.03)

## 2016-07-12 LAB — MAGNESIUM: MAGNESIUM: 1.9 mg/dL (ref 1.7–2.4)

## 2016-07-12 LAB — PLATELET INHIBITION P2Y12: Platelet Function  P2Y12: 235 [PRU] (ref 194–418)

## 2016-07-12 MED ORDER — REGADENOSON 0.4 MG/5ML IV SOLN
INTRAVENOUS | Status: AC
  Filled 2016-07-12: qty 5

## 2016-07-12 MED ORDER — PRASUGREL HCL 10 MG PO TABS: 10.0000 mg | ORAL_TABLET | Freq: Every day | ORAL | Status: DC

## 2016-07-12 MED ORDER — REGADENOSON 0.4 MG/5ML IV SOLN
0.4000 mg | Freq: Once | INTRAVENOUS | Status: AC
  Administered 2016-07-12: 0.4 mg via INTRAVENOUS
  Filled 2016-07-12: qty 5

## 2016-07-12 MED ORDER — POTASSIUM CHLORIDE CRYS ER 20 MEQ PO TBCR
40.0000 meq | EXTENDED_RELEASE_TABLET | Freq: Once | ORAL | Status: AC
Start: 2016-07-12 — End: 2016-07-12
  Administered 2016-07-12: 40 meq via ORAL
  Filled 2016-07-12: qty 2

## 2016-07-12 MED ORDER — TECHNETIUM TC 99M TETROFOSMIN IV KIT
10.0000 | PACK | Freq: Once | INTRAVENOUS | Status: AC | PRN
  Administered 2016-07-12: 10 via INTRAVENOUS

## 2016-07-12 MED ORDER — POTASSIUM CHLORIDE CRYS ER 20 MEQ PO TBCR: 20.0000 meq | EXTENDED_RELEASE_TABLET | Freq: Every day | ORAL | Status: DC

## 2016-07-12 MED ORDER — IBUPROFEN 200 MG PO TABS
400.0000 mg | ORAL_TABLET | Freq: Three times a day (TID) | ORAL | Status: DC
  Administered 2016-07-12: 400 mg via ORAL
  Filled 2016-07-12 (×2): qty 2

## 2016-07-12 MED ORDER — POTASSIUM CHLORIDE CRYS ER 20 MEQ PO TBCR: 20.0000 meq | EXTENDED_RELEASE_TABLET | Freq: Every day | ORAL | 0 refills | Status: DC

## 2016-07-12 MED ORDER — PRASUGREL HCL 10 MG PO TABS: 10.0000 mg | ORAL_TABLET | Freq: Every day | ORAL | 6 refills | Status: DC

## 2016-07-12 NOTE — Progress Notes (Addendum)
Nuc result: IMPRESSION: 1. Questionable small mild reversible perfusion defect at the apex versus artifact. No fixed defect/scar identified.  2. Small paradoxical motion at the septal wall base of the heart.  3. Left ventricular ejection fraction 45%  4. Non invasive risk stratification*: Intermediate  *2012 Appropriate Use Criteria for Coronary Revascularization Focused Update: J Am Coll Cardiol. 2012;59(9):857-881. http://content.dementiazones.com.aspx?articleid=[email or web address]  Reviewed with Dr. Eden Emms - he agrees with reading, feels there is minimal ischemia and does not feel he needs further workup at this time. He feels the patient can be discharged. Have arranged close OP f/u - there were no APPs available in Dr. Fabio Bering care team for this timeframe so it will be with Tereso Newcomer. Initially with PRU of 235 Dr. Eden Emms considered switching to Effient but the patient had signficant GIB on this in 2014 and was actually also recently admitted in 05/2016 with same bleeding issue felt to be diverticular. As such, Dr. Eden Emms recommends to continue Plavix. I spoke with patient and wife about results and recommendations, recommended continued surveillance for any recurrent sx although they were felt atypical this admission. I had initially sent in rx for Effient but then d/c'd this and called his pharmacy to disregard the prescription.  K also noted to be 3.4. Will give prior to DC. Historical potassium appears 3.3-3.5. Will start daily. IM has sent in this script. Recommend OP BMET at time of f/u to determine if this should continue.  Pollie Poma PA-C

## 2016-07-12 NOTE — Discharge Summary (Addendum)
Physician Discharge Summary  Antonio [name] ZOX:[id number] DOB: 1961/01/25 DOA: 07/11/2016  PCP: [facility]  Admit date: 07/11/2016 Discharge date: 07/12/2016  Admitted From: Home Disposition:  Home  Recommendations for Outpatient Follow-up:  1. Follow up with Cardiology scheduled on 4/20 at 11:15am with Antonio Newcomer PA  2. Please obtain BMP/CBC in 1 week   Home Health: No  Equipment/Devices: None   Discharge Condition: Stable CODE STATUS: Full  Diet recommendation: Heart healthy   Brief/Interim Summary: From H&P: Antonio [name] is a 56 y.o. male with medical history significant of CAD with 2 stent placement, diverticulosis, GERD, diabetes mellitus, hypertension, hyperlipidemia, and recent admission for diverticular bleed who presented to [facility] with chest pain. Patient reports awaking at 02:30 with left-sided chest pain which is somewhat atypical in nature due to association with certain movements and deep breathing. He describes it as a dull ache, worse when he rotates, bends forward, takes deep breaths. It is not reproducible. Patient reports no improvement with nitro, but did improve with morphine. Due to patient's risk factors, he was started on a heparin drip, nitro drip and transferred to [facility].   Interim: Troponin was trended which resulted negative. He continued to have 3 out of 10 chest pain, although it was positional by report. He was evaluated by cardiology. Due to his high risk nature, he underwent stress test which revealed intermediate risk. This was discussed with cardiology team. They are not making any changes to his medication regimen at this time due to his previous risk of GI bleed on Effient. Patient to continue aspirin/plavix and follow up with cardiology in office.   Discharge Diagnoses:  Principal Problem:   Atypical chest pain Active Problems:   DM type 2 (diabetes mellitus, type 2) (HCC)   Essential hypertension   CAD S/P percutaneous coronary  angioplasty  Atypical chest pain -Hx CAD with stent placement in 12/23/2015. It is dull in nature, left-sided, worse with rotating, bending forward, deep breaths. Morphine helped from 8/10 to 3/10 -Heart score 3  -CXR unremarkable  -EKG without ST elevation or changes since previous.  -Troponin negative  -ASA and Plavix, lipitor  -Cardiology consulted -Stress test with intermediate risk. Discussed with cardiology team; no change to current medications, follow up in office next week   Hypokalemia -Replace, repeat BMP as outpatient   Essential HTN -Continue norvasc, coreg, lisinopril  DM type 2 -Hold metformin while in hospital  -SSI   Recent diverticular bleed -Monitor CBC. Patient states lower GI bleed as resolved since discharge from hospital. Hgb stable.   GERD -PPI   Discharge Instructions  Discharge Instructions    Diet - low sodium heart healthy    Complete by:  As directed    Increase activity slowly    Complete by:  As directed      Allergies as of 07/12/2016   No Known Allergies     Medication List    TAKE these medications   amLODipine 5 MG tablet Commonly known as:  NORVASC Take 1 tablet (5 mg total) by mouth daily.   aspirin EC 81 MG tablet Take 1 tablet (81 mg total) by mouth daily.   atorvastatin 80 MG tablet Commonly known as:  LIPITOR Take 1 tablet (80 mg total) by mouth daily at 6 PM.   carvedilol 3.125 MG tablet Commonly known as:  COREG Take 1 tablet (3.125 mg total) by mouth 2 (two) times daily with a meal.   clopidogrel 75 MG tablet Commonly known  as:  PLAVIX Take 1 tablet (75 mg total) by mouth daily.   docusate sodium 100 MG capsule Commonly known as:  COLACE Take 100 mg by mouth daily as needed for mild constipation.   insulin aspart protamine - aspart (70-30) 100 UNIT/ML FlexPen Commonly known as:  NOVOLOG MIX 70/30 FLEXPEN Inject 0.35 mLs (35 Units total) into the skin 2 (two) times daily before a meal.   lisinopril  10 MG tablet Commonly known as:  PRINIVIL,ZESTRIL Take 1 tablet (10 mg total) by mouth daily.   metFORMIN 500 MG 24 hr tablet Commonly known as:  GLUCOPHAGE-XR Take 1,000 mg by mouth 2 (two) times daily.   nitroGLYCERIN 0.4 MG SL tablet Commonly known as:  NITROSTAT Place 0.4 mg under the tongue every 5 (five) minutes as needed for chest pain.   ondansetron 8 MG tablet Commonly known as:  ZOFRAN Take 8 mg by mouth every 8 (eight) hours as needed for nausea or vomiting.   pantoprazole 40 MG tablet Commonly known as:  PROTONIX Take 40 mg by mouth daily as needed. For heartburn   potassium chloride SA 20 MEQ tablet Commonly known as:  K-DUR,KLOR-CON Take 1 tablet (20 mEq total) by mouth daily. Start taking on:  07/13/2016      Follow-up Information    Antonio Newcomer, PA-C Follow up.   Specialties:  Cardiology, Physician Assistant Why:  [facility] - 07/20/16 at 11:15am. Please arrive at 11am. Antonio [name] is one of the PAs that works closely with our cardiology team. Contact information: [address] [place] [address] [phone]          No Known Allergies  Consultations:  Cardiology   Procedures/Studies: Dg Chest 2 View  Result Date: 07/11/2016 CLINICAL DATA:  LEFT-sided chest pain beginning earlier today. EXAM: CHEST  2 VIEW COMPARISON:  12/26/2015. FINDINGS: The heart size and mediastinal contours are within normal limits. Both lungs are clear. The visualized skeletal structures are unremarkable. IMPRESSION: No active cardiopulmonary disease. Electronically Signed   By: Elsie Stain M.D.   On: 07/11/2016 10:51   Ct Angio Abdomen W &/or Wo Contrast  Result Date: 06/17/2016 CLINICAL DATA:  Multiple episodes of hematochezia. Hypotensive. Concern for active gastrointestinal bleeding. EXAM: CT ANGIOGRAPHY ABDOMEN TECHNIQUE: Multidetector CT imaging of the abdomen was performed using the standard protocol during bolus administration of intravenous  contrast. Multiplanar reconstructed images and MIPs were obtained and reviewed to evaluate the vascular anatomy. CONTRAST:  100 mL Isovue COMPARISON:  GI bleeding scan 10/24/2012 FINDINGS: VASCULAR Aorta: Aorta is normal caliber with minimal intimal calcification. Celiac: Patent normal hepatic artery and GDA. SMA: Widely patent. The branches of the superior mesenteric artery are traced to the colon. There is no blush of contrast within the ascending, transverse, or descending colon. The sigmoid colon and rectum are not imaged on the CT of the abdomen. Renals: Single an d patent IMA: The proximal IMA is patent. The distal branches of the IMA are not imaged on this CT the abdomen. The rectosigmoid colon is not imaged. Inflow: Normal Veins: Unremarkable Review of the MIP images confirms the above findings. NON-VASCULAR Lower chest: Lung bases are clear. No focal hepatic lesion. Gallbladder normal Hepatobiliary: No focal hepatic lesion.  Gallbladder normal Pancreas: No inflammation. Spleen: Normal Adrenals/Urinary Tract: Kidneys adrenal glands are normal. Stomach/Bowel: The stomach and limited small bowel colon are unremarkable. There are multiple diverticula throughout the transverse and descending colon. No evidence of active gastrointestinal bleeding. Lymphatic: No lymphadenopathy Other: None  Musculoskeletal:  No acute findings IMPRESSION: VASCULAR No evidence of active gastrointestinal bleeding in the ascending, transverse, or descending colon. The sigmoid colon and rectosigmoid colon are not imaged. NON-VASCULAR Extensive diverticular disease of the transverse colon and descending colon. Rectosigmoid colon not imaged. Electronically Signed   By: Genevive Bi M.D.   On: 06/17/2016 22:38   Nm Myocar Multi W/spect W/wall Motion / Ef  Result Date: 07/12/2016 CLINICAL DATA:  56 year old male with a history of chest pain. Cardiovascular risk factors include diabetes, hypertension, hypercholesterolemia, coronary  artery disease EXAM: MYOCARDIAL IMAGING WITH SPECT (REST AND EXERCISE) GATED LEFT VENTRICULAR WALL MOTION STUDY LEFT VENTRICULAR EJECTION FRACTION TECHNIQUE: Standard myocardial SPECT imaging was performed after resting intravenous injection of 10 mCi Tc-78m tetrofosmin. Subsequently, exercise tolerance test was performed by the patient under the supervision of the Cardiology staff. At peak-stress, 30 mCi Tc-36m tetrofosmin was injected intravenously and standard myocardial SPECT imaging was performed. Quantitative gated imaging was also performed to evaluate left ventricular wall motion, and estimate left ventricular ejection fraction. COMPARISON:  None. FINDINGS: Perfusion: Questionable small mild reversible perfusion defect at the apex, versus artifact. No fixed defect identified. Wall Motion: Small paradoxical motion at the septal wall, base of the heart. Left Ventricular Ejection Fraction: 45 % End diastolic volume 136 ml End systolic volume 74 ml IMPRESSION: 1. Questionable small mild reversible perfusion defect at the apex versus artifact. No fixed defect/scar identified. 2. Small paradoxical motion at the septal wall base of the heart. 3. Left ventricular ejection fraction 45% 4. Non invasive risk stratification*: Intermediate *2012 Appropriate Use Criteria for Coronary Revascularization Focused Update: J Am Coll Cardiol. 2012;59(9):857-881. http://content.dementiazones.com.aspx?[email or web address] Electronically Signed   By: Gilmer Mor D.O.   On: 07/12/2016 14:50    Discharge Exam: Vitals:   07/12/16 1347 07/12/16 1548  BP: 130/85 (!) 146/81  Pulse: 78 61  Resp: 20 18  Temp: 98.6 F (37 C) 99 F (37.2 C)   Vitals:   07/12/16 1031 07/12/16 1140 07/12/16 1347 07/12/16 1548  BP: (!) 174/91 (!) 150/89 130/85 (!) 146/81  Pulse: 81 65 78 61  Resp:  18 20 18   Temp:  98.3 F (36.8 C) 98.6 F (37 C) 99 F (37.2 C)  TempSrc:  Oral Oral Oral  SpO2:  100% 96% 100%  Weight:      Height:          General: Pt is alert, awake, not in acute distress Cardiovascular: RRR, S1/S2 +, no rubs, no gallops Respiratory: CTA bilaterally, no wheezing, no rhonchi Abdominal: Soft, NT, ND, bowel sounds + Extremities: no edema, no cyanosis    The results of significant diagnostics from this hospitalization (including imaging, microbiology, ancillary and laboratory) are listed below for reference.     Microbiology: No results found for this or any previous visit (from the past 240 hour(s)).   Labs: BNP (last 3 results) No results for input(s): BNP in the last 8760 hours. Basic Metabolic Panel:  Recent Labs Lab 07/11/16 1032 07/12/16 0001  NA 136 137  K 3.6 3.4*  CL 101 102  CO2 26 25  GLUCOSE 186* 153*  BUN 11 10  CREATININE 0.91 0.94  CALCIUM 9.2 8.9  MG  --  1.9   Liver Function Tests: No results for input(s): AST, ALT, ALKPHOS, BILITOT, PROT, ALBUMIN in the last 168 hours. No results for input(s): LIPASE, AMYLASE in the last 168 hours. No results for input(s): AMMONIA in the last 168 hours. CBC:  Recent Labs Lab 07/11/16  1032 07/12/16 0001  WBC 7.2 8.0  HGB 12.0* 10.9*  HCT 35.5* 33.6*  MCV 83.3 83.6  PLT 228 262   Cardiac Enzymes:  Recent Labs Lab 07/11/16 1032 07/11/16 1322 07/11/16 1815 07/11/16 2029 07/12/16 0001  TROPONINI 0.04* 0.04* <0.03 <0.03 <0.03   BNP: Invalid input(s): POCBNP CBG:  Recent Labs Lab 07/11/16 1653 07/11/16 2024 07/12/16 0738 07/12/16 1141 07/12/16 1554  GLUCAP 223* 187* 171* 179* 151*   D-Dimer  Recent Labs  07/11/16 1032  DDIMER 0.45   Hgb A1c No results for input(s): HGBA1C in the last 72 hours. Lipid Profile No results for input(s): CHOL, HDL, LDLCALC, TRIG, CHOLHDL, LDLDIRECT in the last 72 hours. Thyroid function studies No results for input(s): TSH, T4TOTAL, T3FREE, THYROIDAB in the last 72 hours.  Invalid input(s): FREET3 Anemia work up No results for input(s): VITAMINB12, FOLATE, FERRITIN,  TIBC, IRON, RETICCTPCT in the last 72 hours. Urinalysis    Component Value Date/Time   COLORURINE YELLOW 12/27/2015 0211   APPEARANCEUR CLEAR 12/27/2015 0211   LABSPEC 1.020 12/27/2015 0211   PHURINE 5.5 12/27/2015 0211   GLUCOSEU 100 (A) 12/27/2015 0211   HGBUR NEGATIVE 12/27/2015 0211   BILIRUBINUR NEGATIVE 12/27/2015 0211   KETONESUR NEGATIVE 12/27/2015 0211   PROTEINUR NEGATIVE 12/27/2015 0211   UROBILINOGEN 0.2 10/23/2012 0249   NITRITE NEGATIVE 12/27/2015 0211   LEUKOCYTESUR NEGATIVE 12/27/2015 0211   Sepsis Labs Invalid input(s): PROCALCITONIN,  WBC,  LACTICIDVEN Microbiology No results found for this or any previous visit (from the past 240 hour(s)).   Time coordinating discharge: 40 minutes  SIGNED:  Noralee Stain, DO [facility] Pager [phone]  If 7PM-7AM, please contact night-coverage [email or web address] Password [facility] 07/12/2016, 4:24 PM

## 2016-07-12 NOTE — Consult Note (Signed)
CARDIOLOGY CONSULT NOTE       Patient ID: Antonio [name] MRN: [id number] DOB/AGE: 1960/12/20 56 y.o.  Admit date: 07/11/2016 Referring Physician:  Alvino Chapel Primary Physician: [facility] Primary Cardiologist: Anne Fu Reason for Consultation: Chest Pain  Principal Problem:   Atypical chest pain Active Problems:   DM type 2 (diabetes mellitus, type 2) (HCC)   Essential hypertension   CAD S/P percutaneous coronary angioplasty   HPI:  56 y.o. extensive history of CAD. Has had stents to diagonal, OM, and both proximal and distal RCA. SEMI 12/23/15 with new DES to ostial RCA. EF has been normal last echo September 2017 EF 55-60%.  2030 am had sharp left sided chest pain. Pain is continuous 3/10 this am Positional worse leaning forward and some pleuritic. No dyspnea or LE edema Compliant with meds Nitro did not help but morphine die. Compliant with meds. No fever, trauma arthritis or recent Travel.  Former smoker quit 1004  Enzymes negative no acute ECG changes D dimer negative and CXR with NAD  ROS All other systems reviewed and negative except as noted above  Past Medical History:  Diagnosis Date  . Diverticulosis   . GERD (gastroesophageal reflux disease)   . Heart attack 2011; 2017  . Heart murmur   . Hemorrhoids   . High cholesterol   . History of blood transfusion 2011; 2017; 05/2016   "elated to diverticulitis"  . History of kidney stones   . Hyperlipidemia due to type 2 diabetes mellitus (HCC)   . Hypertension   . Lower GI bleed 2011; 2017; 05/2016  . PTSD (post-traumatic stress disorder)   . Type II diabetes mellitus (HCC)     Family History  Problem Relation Age of Onset  . Alzheimer's disease Mother 36  . Heart attack Father 92  . CAD Brother 40    triple bypass    Social History   Social History  . Marital status: Married    Spouse name: N/A  . Number of children: N/A  . Years of education: N/A   Occupational History  . Retired Electronics engineer    Social History  Main Topics  . Smoking status: Former Smoker    Packs/day: 0.50    Years: 20.00    Types: Cigarettes    Quit date: 10/19/2002  . Smokeless tobacco: Never Used  . Alcohol use Yes     Comment: 07/11/2016 "might have a beer/month; if that"  . Drug use: Yes    Types: Marijuana     Comment: 07/11/2016 "qd"  . Sexual activity: Yes   Other Topics Concern  . Not on file   Social History Narrative   Lives with wife in [place].    Past Surgical History:  Procedure Laterality Date  . CARDIAC CATHETERIZATION N/A 12/23/2015   Procedure: Left Heart Cath and Coronary Angiography;  Surgeon: Marisella Puccio M [place], MD;  Location: [facility] INVASIVE CV LAB;  Service: Cardiovascular;  Laterality: N/A;  . CARDIAC CATHETERIZATION N/A 12/23/2015   Procedure: Coronary Stent Intervention;  Surgeon: Dewie Ahart M [place], MD;  Location: [facility] INVASIVE CV LAB;  Service: Cardiovascular;  Laterality: N/A;  . COLON RESECTION  2011   12 inches taken out in 2011 at [facility] for diverticulosis  . COLON SURGERY    . COLONOSCOPY N/A 10/18/2012   Procedure: COLONOSCOPY;  Surgeon: Louis Meckel, MD;  Location: [facility] ENDOSCOPY;  Service: Endoscopy;  Laterality: N/A;  . COLONOSCOPY N/A 10/22/2012   Procedure: COLONOSCOPY;  Surgeon: Iva Boop,  MD;  Location: [facility] ENDOSCOPY;  Service: Endoscopy;  Laterality: N/A;  . CORONARY ANGIOPLASTY WITH STENT PLACEMENT  2011   at [facility], 2 stents, locations unknown  . ESOPHAGOGASTRODUODENOSCOPY N/A 10/18/2012   Procedure: ESOPHAGOGASTRODUODENOSCOPY (EGD);  Surgeon: Louis Meckel, MD;  Location: [facility] ENDOSCOPY;  Service: Endoscopy;  Laterality: N/A;  . INGUINAL HERNIA REPAIR Right   . IRRIGATION AND DEBRIDEMENT ABSCESS N/A 10/26/2012   Procedure: IRRIGATION AND DEBRIDEMENT PERINEAL ABSCESS;  Surgeon: Wilmon Arms. Corliss Skains, MD;  Location: [facility] OR;  Service: General;  Laterality: N/A;     . amLODipine  5 mg Oral Daily  . aspirin EC  81 mg Oral Daily  . atorvastatin  80 mg Oral q1800  .  carvedilol  3.125 mg Oral BID WC  . clopidogrel  75 mg Oral Daily  . insulin aspart  0-15 Units Subcutaneous TID WC  . lisinopril  10 mg Oral Daily  . pantoprazole  40 mg Oral Daily  . regadenoson  0.4 mg Intravenous Once   . heparin 1,250 Units/hr (07/11/16 2142)    Physical Exam: Blood pressure 126/81, pulse 81, temperature 98 F (36.7 C), temperature source Oral, resp. rate 16, height 5\' 7"  (1.702 m), weight 196 lb 11.2 oz (89.2 kg), SpO2 98 %.   Affect appropriate Black male  Healthy:  appears stated age HEENT: normal Neck supple with no adenopathy JVP normal no bruits no thyromegaly Lungs clear with no wheezing and good diaphragmatic motion Heart:  S1/S2 no murmur, no rub, gallop or click PMI normal Abdomen: benighn, BS positve, no tenderness, no AAA no bruit.  No HSM or HJR Distal pulses intact with no bruits No edema Neuro non-focal Skin warm and dry No muscular weakness   Labs:   Lab Results  Component Value Date   WBC 8.0 07/12/2016   HGB 10.9 (L) 07/12/2016   HCT 33.6 (L) 07/12/2016   MCV 83.6 07/12/2016   PLT 262 07/12/2016     Recent Labs Lab 07/12/16 0001  NA 137  K 3.4*  CL 102  CO2 25  BUN 10  CREATININE 0.94  CALCIUM 8.9  GLUCOSE 153*   Lab Results  Component Value Date   CKTOTAL 317 (H) 09/23/2010   CKMB 2.4 09/23/2010   TROPONINI <0.03 07/12/2016    Lab Results  Component Value Date   CHOL 188 12/23/2015   CHOL 197 12/22/2015   Lab Results  Component Value Date   HDL 39 (L) 12/23/2015   HDL 39 (L) 12/22/2015   Lab Results  Component Value Date   LDLCALC 121 (H) 12/23/2015   LDLCALC 120 (H) 12/22/2015   Lab Results  Component Value Date   TRIG 138 12/23/2015   TRIG 192 (H) 12/22/2015   Lab Results  Component Value Date   CHOLHDL 4.8 12/23/2015   CHOLHDL 5.1 12/22/2015   No results found for: LDLDIRECT    Radiology: Dg Chest 2 View  Result Date: 07/11/2016 CLINICAL DATA:  LEFT-sided chest pain beginning earlier  today. EXAM: CHEST  2 VIEW COMPARISON:  12/26/2015. FINDINGS: The heart size and mediastinal contours are within normal limits. Both lungs are clear. The visualized skeletal structures are unremarkable. IMPRESSION: No active cardiopulmonary disease. Electronically Signed   By: Elsie Stain M.D.   On: 07/11/2016 10:51   Ct Angio Abdomen W &/or Wo Contrast  Result Date: 06/17/2016 CLINICAL DATA:  Multiple episodes of hematochezia. Hypotensive. Concern for active gastrointestinal bleeding. EXAM: CT ANGIOGRAPHY ABDOMEN TECHNIQUE: Multidetector CT imaging of  the abdomen was performed using the standard protocol during bolus administration of intravenous contrast. Multiplanar reconstructed images and MIPs were obtained and reviewed to evaluate the vascular anatomy. CONTRAST:  100 mL Isovue COMPARISON:  GI bleeding scan 10/24/2012 FINDINGS: VASCULAR Aorta: Aorta is normal caliber with minimal intimal calcification. Celiac: Patent normal hepatic artery and GDA. SMA: Widely patent. The branches of the superior mesenteric artery are traced to the colon. There is no blush of contrast within the ascending, transverse, or descending colon. The sigmoid colon and rectum are not imaged on the CT of the abdomen. Renals: Single an d patent IMA: The proximal IMA is patent. The distal branches of the IMA are not imaged on this CT the abdomen. The rectosigmoid colon is not imaged. Inflow: Normal Veins: Unremarkable Review of the MIP images confirms the above findings. NON-VASCULAR Lower chest: Lung bases are clear. No focal hepatic lesion. Gallbladder normal Hepatobiliary: No focal hepatic lesion.  Gallbladder normal Pancreas: No inflammation. Spleen: Normal Adrenals/Urinary Tract: Kidneys adrenal glands are normal. Stomach/Bowel: The stomach and limited small bowel colon are unremarkable. There are multiple diverticula throughout the transverse and descending colon. No evidence of active gastrointestinal bleeding. Lymphatic: No  lymphadenopathy Other: None Musculoskeletal:  No acute findings IMPRESSION: VASCULAR No evidence of active gastrointestinal bleeding in the ascending, transverse, or descending colon. The sigmoid colon and rectosigmoid colon are not imaged. NON-VASCULAR Extensive diverticular disease of the transverse colon and descending colon. Rectosigmoid colon not imaged. Electronically Signed   By: Genevive Bi M.D.   On: 06/17/2016 22:38    EKG: SR anterolateral T wave changes similar to September 2017   ASSESSMENT AND PLAN:  Chest Pain:  Atypical but extensive history of CAD with chronically abnormal ECG patient unable to walk on treadmill will order lexiscan myovue. Ok to d/c latter today if normal. Rx with NSAI"s  D/C heparin   Cholesterol  On statin  Lab Results  Component Value Date   LDLCALC 121 (H) 12/23/2015   GERD:  On protonix check P2Y to make sure he is responding given multiple stents  HTN:  On ACE Well controlled.  Continue current medications and low sodium Dash type diet .    SignedCharlton Haws 07/12/2016, 8:27 AM

## 2016-07-13 ENCOUNTER — Ambulatory Visit (HOSPITAL_COMMUNITY): Payer: Non-veteran care

## 2016-07-20 ENCOUNTER — Ambulatory Visit (INDEPENDENT_AMBULATORY_CARE_PROVIDER_SITE_OTHER): Payer: Non-veteran care | Admitting: Physician Assistant

## 2016-07-20 ENCOUNTER — Encounter: Payer: Self-pay | Admitting: Physician Assistant

## 2016-07-20 ENCOUNTER — Ambulatory Visit (HOSPITAL_COMMUNITY): Payer: Non-veteran care

## 2016-07-20 VITALS — BP 128/78 | HR 71 | Ht 67.0 in | Wt 197.1 lb

## 2016-07-20 DIAGNOSIS — Z8719 Personal history of other diseases of the digestive system: Secondary | ICD-10-CM | POA: Diagnosis not present

## 2016-07-20 DIAGNOSIS — E785 Hyperlipidemia, unspecified: Secondary | ICD-10-CM

## 2016-07-20 DIAGNOSIS — I2510 Atherosclerotic heart disease of native coronary artery without angina pectoris: Secondary | ICD-10-CM

## 2016-07-20 DIAGNOSIS — I10 Essential (primary) hypertension: Secondary | ICD-10-CM

## 2016-07-20 NOTE — Progress Notes (Signed)
Cardiology Office Note:    Date:  07/20/2016   ID:  Antonio [name], DOB 09-03-1960, MRN [id number]  PCP:  [facility]  Cardiologist:  Dr. Donato Schultz   Electrophysiologist:  n/a  Referring MD: [facility]   Chief Complaint  Patient presents with  . Hospitalization Follow-up    chest pain; abnormal stress test    History of Present Illness:    Antonio [name] is a 56 y.o. male with a hx of CAD with prior PCI to the DX, OM1, RCA, HTN, HL, prior GI bleed.  He is status post non-STEMI in 9/17 treated with DES times to the RCA.  Last admission for lower GI bleed in 3/18. He required transfusion with PRBCs 2. Aspirin Plavix were resumed prior to discharge. He was admitted 4/11-4/12 chest pain. Troponin levels remained negative. He was evaluated by cardiology. Inpatient stress testing was arranged. This demonstrated reversible perfusion defect at the apex versus artifact, EF 45. Study was reviewed with Dr. Eden [name] who did his consultation in the hospital. Defect was felt to represent minimal ischemia and no further workup was recommended at that that time. Of note, PRU 235. There was some consideration for changing Plavix to Effient. However, given his recent history of GI bleeding, he was kept on Plavix and aspirin.  He returns for follow up.  He is here with his wife.  Since DC, he continues to note L sided chest discomfort that seems to be more noticeable with positional changes.  He denies exertional chest pain or dyspnea on exertion.  He denies symptoms like his prior angina.  He denies orthopnea, PND, edema, syncope.  His chest pain seems to be improving.   Prior CV studies:   The following studies were reviewed today:  Myoview 07/12/16 IMPRESSION: 1. Questionable small mild reversible perfusion defect at the apex versus artifact. No fixed defect/scar identified. 2. Small paradoxical motion at the septal wall base of the heart. 3. Left ventricular ejection fraction 45% 4.  Non invasive risk stratification*: Intermediate  Myoview 10/17 EF 45, inferior defect consistent with diaphragmatic attenuation, low risk  Echo 9/17 Mild concentric LVH, EF 55-60, normal wall motion, grade 1 diastolic dysfunction, trivial MR, mild LAE  LHC 9/17 LM ostial 20 LAD ostial 40, D1 stent patent OM1 stent patent RCA ostial 80, mid 95, mid to distal stent patent EF 55-65 PCI: 3 x 16 mm Promus Premier DES to mid RCA, 3.5 x 12 mm Promus Premier DES to the proximal RCA  Past Medical History:  Diagnosis Date  . Diverticulosis   . GERD (gastroesophageal reflux disease)   . Heart attack (HCC) 2011; 2017  . Heart murmur   . Hemorrhoids   . High cholesterol   . History of blood transfusion 2011; 2017; 05/2016   "elated to diverticulitis"  . History of kidney stones   . Hyperlipidemia due to type 2 diabetes mellitus (HCC)   . Hypertension   . Lower GI bleed 2011; 2017; 05/2016  . PTSD (post-traumatic stress disorder)   . Type II diabetes mellitus (HCC)     Past Surgical History:  Procedure Laterality Date  . CARDIAC CATHETERIZATION N/A 12/23/2015   Procedure: Left Heart Cath and Coronary Angiography;  Surgeon: Antonio M Swaziland, MD;  Location: [facility] INVASIVE CV LAB;  Service: Cardiovascular;  Laterality: N/A;  . CARDIAC CATHETERIZATION N/A 12/23/2015   Procedure: Coronary Stent Intervention;  Surgeon: Antonio M Swaziland, MD;  Location: [facility] INVASIVE CV LAB;  Service: Cardiovascular;  Laterality: N/A;  .  COLON RESECTION  2011   12 inches taken out in 2011 at [facility] for diverticulosis  . COLON SURGERY    . COLONOSCOPY N/A 10/18/2012   Procedure: COLONOSCOPY;  Surgeon: Antonio Meckel, MD;  Location: [facility] ENDOSCOPY;  Service: Endoscopy;  Laterality: N/A;  . COLONOSCOPY N/A 10/22/2012   Procedure: COLONOSCOPY;  Surgeon: Antonio Boop, MD;  Location: [facility] ENDOSCOPY;  Service: Endoscopy;  Laterality: N/A;  . CORONARY ANGIOPLASTY WITH STENT PLACEMENT  2011   at [facility], 2  stents, locations unknown  . ESOPHAGOGASTRODUODENOSCOPY N/A 10/18/2012   Procedure: ESOPHAGOGASTRODUODENOSCOPY (EGD);  Surgeon: Antonio Meckel, MD;  Location: [facility] ENDOSCOPY;  Service: Endoscopy;  Laterality: N/A;  . INGUINAL HERNIA REPAIR Right   . IRRIGATION AND DEBRIDEMENT ABSCESS N/A 10/26/2012   Procedure: IRRIGATION AND DEBRIDEMENT PERINEAL ABSCESS;  Surgeon: Antonio [name]. Antonio Skains, MD;  Location: [facility] OR;  Service: General;  Laterality: N/A;    Current Medications: Current Meds  Medication Sig  . amLODipine (NORVASC) 5 MG tablet Take 1 tablet (5 mg total) by mouth daily.  Marland Kitchen aspirin EC 81 MG tablet Take 1 tablet (81 mg total) by mouth daily.  Marland Kitchen atorvastatin (LIPITOR) 80 MG tablet Take 1 tablet (80 mg total) by mouth daily at 6 PM.  . carvedilol (COREG) 3.125 MG tablet Take 1 tablet (3.125 mg total) by mouth 2 (two) times daily with a meal.  . clopidogrel (PLAVIX) 75 MG tablet Take 1 tablet (75 mg total) by mouth daily.  Marland Kitchen docusate sodium (COLACE) 100 MG capsule Take 100 mg by mouth daily as needed for mild constipation.  . insulin aspart protamine - aspart (NOVOLOG MIX 70/30 FLEXPEN) (70-30) 100 UNIT/ML FlexPen Inject 0.35 mLs (35 Units total) into the skin 2 (two) times daily before a meal.  . lisinopril (PRINIVIL,ZESTRIL) 10 MG tablet Take 1 tablet (10 mg total) by mouth daily.  . metFORMIN (GLUCOPHAGE-XR) 500 MG 24 hr tablet Take 1,000 mg by mouth 2 (two) times daily.  . nitroGLYCERIN (NITROSTAT) 0.4 MG SL tablet Place 0.4 mg under the tongue every 5 (five) minutes as needed for chest pain.  Marland Kitchen ondansetron (ZOFRAN) 8 MG tablet Take 8 mg by mouth every 8 (eight) hours as needed for nausea or vomiting.  . pantoprazole (PROTONIX) 40 MG tablet Take 40 mg by mouth daily as needed. For heartburn  . potassium chloride SA (K-DUR,KLOR-CON) 20 MEQ tablet Take 1 tablet (20 mEq total) by mouth daily.     Allergies:   Patient has no known allergies.   Social History   Social History  . Marital status:  Married    Spouse name: N/A  . Number of children: N/A  . Years of education: N/A   Occupational History  . Retired Electronics engineer    Social History Main Topics  . Smoking status: Former Smoker    Packs/day: 0.50    Years: 20.00    Types: Cigarettes    Quit date: 10/19/2002  . Smokeless tobacco: Never Used  . Alcohol use Yes     Comment: 07/11/2016 "might have a beer/month; if that"  . Drug use: Yes    Types: Marijuana     Comment: 07/11/2016 "qd"  . Sexual activity: Yes   Other Topics Concern  . None   Social History Narrative   Lives with wife in [place].     Family History  Problem Relation Age of Onset  . Alzheimer's disease Mother 39  . Heart attack Father 66  .  CAD Brother 40    triple bypass     ROS:   Please see the history of present illness.    Review of Systems  Cardiovascular: Positive for chest pain.  Hematologic/Lymphatic: Positive for bleeding problem.  Skin: Positive for rash.  Gastrointestinal: Positive for abdominal pain, diarrhea, hematochezia, nausea and vomiting.   All other systems reviewed and are negative.   EKGs/Labs/Other Test Reviewed:    EKG:  EKG is  ordered today.  The ekg ordered today demonstrates NSR, HR 71, normal axis, subtle TWI 1, 2, aVF, V5-6, QTc 406 ms, no change from prior tracings.   Recent Labs: 06/17/2016: ALT 16 07/12/2016: BUN 10; Creatinine, Ser 0.94; Hemoglobin 10.9; Magnesium 1.9; Platelets 262; Potassium 3.4; Sodium 137   Recent Lipid Panel    Component Value Date/Time   CHOL 188 12/23/2015 0421   TRIG 138 12/23/2015 0421   HDL 39 (L) 12/23/2015 0421   CHOLHDL 4.8 12/23/2015 0421   VLDL 28 12/23/2015 0421   LDLCALC 121 (H) 12/23/2015 0421     Physical Exam:    VS:  BP 128/78   Pulse 71   Ht 5\' 7"  (1.702 m)   Wt 197 lb 1.9 oz (89.4 kg)   BMI 30.87 kg/m     Wt Readings from Last 3 Encounters:  07/20/16 197 lb 1.9 oz (89.4 kg)  07/12/16 196 lb 11.2 oz (89.2 kg)  06/17/16 194 lb (88 kg)     Physical  Exam  Constitutional: He is oriented to person, place, and time. He appears well-developed and well-nourished. No distress.  HENT:  Head: Normocephalic and atraumatic.  Eyes: No scleral icterus.  Neck: Normal range of motion. No JVD present. Carotid bruit is not present.  Cardiovascular: Normal rate, regular rhythm, S1 normal and S2 normal.   No murmur heard. Pulmonary/Chest: Effort normal and breath sounds normal. He has no wheezes. He has no rhonchi. He has no rales.  Abdominal: Soft. There is no tenderness.  Musculoskeletal: He exhibits no edema.  Neurological: He is alert and oriented to person, place, and time.  Skin: Skin is warm and dry.  Psychiatric: He has a normal mood and affect.    ASSESSMENT:    1. Coronary artery disease involving native coronary artery of native heart without angina pectoris   2. Essential hypertension   3. Hyperlipidemia, unspecified hyperlipidemia type   4. History of lower GI bleeding    PLAN:    In order of problems listed above:  1. Coronary artery disease involving native coronary artery of native heart without angina pectoris -  He is s/p multiple PCIs in the past with the most recent being a DES x 2 to the RCA in 9/17 in the setting of NSTEMI.  He was recently admitted with atypical chest pain.  Troponin levels were neg.  The nuclear stress test did demonstrate a small area of apical ischemia.  The EF was similar to his prior nuclear stress test with normal ejection fraction on Echo.  His symptoms seem to be MSK in nature and improving.  His ECG is unchanged.  He denies symptoms reminiscent of his prior angina.  Given his recent GI bleed, I would have a high threshold to recommend proceeding with cardiac cath.  We had a long discussion regarding the results of his stress test.  Of note, his PRU was somewhat elevated. However, recommendation in the hospital was to avoid higher potency antiplatelet therapy given recent history of GI bleeding. For now,  I recommend continuing current medical Rx.   -  Continue ASA, Plavix, statin, beta-blocker, ACE inhibitor.  -  Close follow up with Dr. Donato Schultz in 6 weeks.  -  If symptoms progress, he will need cardiac cath.  2. Essential hypertension - BP is controlled.   3. Hyperlipidemia, unspecified hyperlipidemia type - Continue statin.    4. History of lower GI bleeding - No evidence of recurrent bleeding.   Dispo:  Return in about 6 weeks (around 08/31/2016) for Close Follow Up w/ Dr. Anne Fu, [name], NP or Tereso Newcomer, PA-C .   Medication Adjustments/Labs and Tests Ordered: Current medicines are reviewed at length with the patient today.  Concerns regarding medicines are outlined above.   Orders Placed This Encounter  Procedures  . EKG 12-Lead    Signed, Tereso Newcomer, PA-C  07/20/2016 12:02 PM    [facility] [address] [place], [place], [place]  [address] Phone: [phone]; Fax: [phone]

## 2016-07-20 NOTE — Patient Instructions (Addendum)
Medication Instructions:  Your physician recommends that you continue on your current medications as directed. Please refer to the Current Medication list given to you today.  Labwork: NONE ORDERED  Testing/Procedures: NONE ORDERED  Follow-Up: 08/31/16 @ 11 AM WITH DR. Anne Fu  Any Other Special Instructions Will Be Listed Below (If Applicable).  If you need a refill on your cardiac medications before your next appointment, please call your pharmacy.

## 2016-07-24 ENCOUNTER — Encounter: Payer: Non-veteran care | Admitting: Cardiology

## 2016-07-27 ENCOUNTER — Ambulatory Visit (HOSPITAL_COMMUNITY): Payer: Non-veteran care

## 2016-08-09 ENCOUNTER — Encounter: Payer: Self-pay | Admitting: Cardiology

## 2016-08-31 ENCOUNTER — Ambulatory Visit: Payer: Non-veteran care | Admitting: Cardiology

## 2016-09-04 ENCOUNTER — Encounter: Payer: Self-pay | Admitting: Cardiology

## 2016-09-19 NOTE — Progress Notes (Signed)
Cardiology Office Note    Date:  09/20/2016   ID:  Antonio [name], DOB [birth year], MRN [id number]  PCP[facility]  [facility]  Cardiologist:  Dr. Anne Fu   CC: follow up  History of Present Illness:  Antonio [name] is a 56 y.o. male with a history of CAD with prior PCI to the DX, OM1 & RCA, HTN, HLD, and prior GI bleed who presents to clinic for follow up.   He is s/p multiple PCIs in the past with the most recent being a DES x 2 to the RCA in 9/17 in the setting of NSTEMI.  He was admitted for lower GI bleed in 05/2016. He required transfusion with PRBCs 2. Aspirin/Plavix were resumed prior to discharge. He was admitted 4/11-4/12/18 for chest pain. Troponin levels remained negative. He was evaluated by cardiology. Inpatient stress testing was arranged. This demonstrated reversible perfusion defect at the apex versus artifact, EF 45%. Study was reviewed with Dr. Eden Emms who did his consultation in the hospital. Defect was felt to represent minimal ischemia and no further workup was recommended at that that time. Of note, PRU 235. There was some consideration for changing Plavix to Effient. However, given his recent history of GI bleeding, he was kept on Plavix and aspirin.  He was seen by Tereso Newcomer PA-C in 07/2016 for follow up. He reported continued left sided chest discomfort that was positional. He was continued on medical therapy.   Today he presents to clinic for follow up. He is no longer having any chest pain. He tries to stay pretty active. He goes bowling a lot for fun. He tries to walk about 30 minutes 1 x a week. He has no exertional chest pain or SOB. No LE edema, orthopnea or PND. When he was [place] he had some orthopnea/PND but this resolved upon his return to [facility]. No dizziness or syncope. No blood in stool or urine. No palpitations. No LE claudication.     Past Medical History:  Diagnosis Date  . Diverticulosis   . GERD (gastroesophageal reflux disease)   . Heart attack (HCC)  2011; 2017  . Heart murmur   . Hemorrhoids   . High cholesterol   . History of blood transfusion 2011; 2017; 05/2016   "elated to diverticulitis"  . History of kidney stones   . Hyperlipidemia due to type 2 diabetes mellitus (HCC)   . Hypertension   . Lower GI bleed 2011; 2017; 05/2016  . PTSD (post-traumatic stress disorder)   . Type II diabetes mellitus (HCC)     Past Surgical History:  Procedure Laterality Date  . CARDIAC CATHETERIZATION N/A 12/23/2015   Procedure: Left Heart Cath and Coronary Angiography;  Surgeon: Peter M Swaziland, MD;  Location: [facility] INVASIVE CV LAB;  Service: Cardiovascular;  Laterality: N/A;  . CARDIAC CATHETERIZATION N/A 12/23/2015   Procedure: Coronary Stent Intervention;  Surgeon: Peter M Swaziland, MD;  Location[facility] INVASIVE CV LAB;  Service: Cardiovascular;  Laterality: N/A;  . COLON RESECTION  2011   12 inches taken out in 2011 at [facility] for diverticulosis  . COLON SURGERY    . COLONOSCOPY N/A 10/18/2012   Procedure: COLONOSCOPY;  Surgeon: Louis Meckel, MD;  Location: [facility] ENDOSCOPY;  Service: Endoscopy;  Laterality: N/A;  . COLONOSCOPY N/A 10/22/2012   Procedure: COLONOSCOPY;  Surgeon: Iva Boop, MD;  Location: [facility] ENDOSCOPY;  Service: Endoscopy;  Laterality: N/A;  . CORONARY ANGIOPLASTY WITH STENT PLACEMENT  2011   at [facility], 2 stents, locations unknown  . ESOPHAGOGASTRODUODENOSCOPY N/A 10/18/2012   Procedure: ESOPHAGOGASTRODUODENOSCOPY (EGD);  Surgeon: Louis Meckel, MD;  Location: [facility] ENDOSCOPY;  Service: Endoscopy;  Laterality: N/A;  . INGUINAL HERNIA REPAIR Right   . IRRIGATION AND DEBRIDEMENT ABSCESS N/A 10/26/2012   Procedure: IRRIGATION AND DEBRIDEMENT PERINEAL ABSCESS;  Surgeon: Wilmon Arms. Corliss Skains, MD;  Location: [facility] OR;  Service: General;  Laterality: N/A;    Current Medications: Outpatient Medications Prior to Visit  Medication Sig Dispense Refill  . amLODipine (NORVASC) 5 MG tablet Take 1 tablet (5 mg total) by mouth  daily. 90 tablet 3  . aspirin EC 81 MG tablet Take 1 tablet (81 mg total) by mouth daily. 1 tablet 0  . atorvastatin (LIPITOR) 80 MG tablet Take 1 tablet (80 mg total) by mouth daily at 6 PM. 90 tablet 3  . carvedilol (COREG) 3.125 MG tablet Take 1 tablet (3.125 mg total) by mouth 2 (two) times daily with a meal. 180 tablet 3  . clopidogrel (PLAVIX) 75 MG tablet Take 1 tablet (75 mg total) by mouth daily. 90 tablet 3  . docusate sodium (COLACE) 100 MG capsule Take 100 mg by mouth daily as needed for mild constipation.    . insulin aspart protamine - aspart (NOVOLOG MIX 70/30 FLEXPEN) (70-30) 100 UNIT/ML FlexPen Inject 0.35 mLs (35 Units total) into the skin 2 (two) times daily before a meal.    . metFORMIN (GLUCOPHAGE-XR) 500 MG 24 hr tablet Take 1,000 mg by mouth 2 (two) times daily.    . nitroGLYCERIN (NITROSTAT) 0.4 MG SL tablet Place 0.4 mg under the tongue every 5 (five) minutes as needed for chest pain.    Marland Kitchen ondansetron (ZOFRAN) 8 MG tablet Take 8 mg by mouth every 8 (eight) hours as needed for nausea or vomiting.    . pantoprazole (PROTONIX) 40 MG tablet Take 40 mg by mouth daily as needed. For heartburn    . potassium chloride SA (K-DUR,KLOR-CON) 20 MEQ tablet Take 1 tablet (20 mEq total) by mouth daily. 14 tablet 0  . lisinopril (PRINIVIL,ZESTRIL) 10 MG tablet Take 1 tablet (10 mg total) by mouth daily. 90 tablet 3   No facility-administered medications prior to visit.      Allergies:   Patient has no known allergies.   Social History   Social History  . Marital status: Married    Spouse name: N/A  . Number of children: N/A  . Years of education: N/A   Occupational History  . Retired Electronics engineer    Social History Main Topics  . Smoking status: Former Smoker    Packs/day: 0.50    Years: 20.00    Types: Cigarettes    Quit date: 10/19/2002  . Smokeless tobacco: Never Used  . Alcohol use Yes     Comment: 07/11/2016 "might have a beer/month; if that"  . Drug use: Yes    Types:  Marijuana     Comment: 07/11/2016 "qd"  . Sexual activity: Yes   Other Topics Concern  . None   Social History Narrative   Lives with wife in [place].     Family History:  The patient's family history includes Alzheimer's disease (age of onset: 78) in his mother; CAD (age of onset: 41) in his brother; Heart attack (age of onset: 6) in his father.      ROS:   Please see the history of present illness.    ROS All other systems reviewed and are negative.   PHYSICAL  EXAM:   VS:  BP (!) 150/92   Pulse 60   Ht 5\' 7"  (1.702 m)   Wt 199 lb 12.8 oz (90.6 kg)   BMI 31.29 kg/m    GEN: Well nourished, well developed, in no acute distress  HEENT: normal  Neck: no JVD, carotid bruits, or masses Cardiac: RRR; no murmurs, rubs, or gallops,no edema  Respiratory:  clear to auscultation bilaterally, normal work of breathing GI: soft, nontender, nondistended, + BS MS: no deformity or atrophy  Skin: warm and dry, no rash Neuro:  Alert and Oriented x 3, Strength and sensation are intact Psych: euthymic mood, full affect     Wt Readings from Last 3 Encounters:  09/20/16 199 lb 12.8 oz (90.6 kg)  07/20/16 197 lb 1.9 oz (89.4 kg)  07/12/16 196 lb 11.2 oz (89.2 kg)      Studies/Labs Reviewed:   EKG:  EKG is NOT ordered today.    Recent Labs: 06/17/2016: ALT 16 07/12/2016: BUN 10; Creatinine, Ser 0.94; Hemoglobin 10.9; Magnesium 1.9; Platelets 262; Potassium 3.4; Sodium 137   Lipid Panel    Component Value Date/Time   CHOL 188 12/23/2015 0421   TRIG 138 12/23/2015 0421   HDL 39 (L) 12/23/2015 0421   CHOLHDL 4.8 12/23/2015 0421   VLDL 28 12/23/2015 0421   LDLCALC 121 (H) 12/23/2015 0421    Additional studies/ records that were reviewed today include:  Myoview 07/12/16 IMPRESSION: 1. Questionable small mild reversible perfusion defect at the apex versus artifact. No fixed defect/scar identified. 2. Small paradoxical motion at the septal wall base of the heart. 3. Left  ventricular ejection fraction 45% 4. Non invasive risk stratification*: Intermediate  Myoview 10/17 EF 45, inferior defect consistent with diaphragmatic attenuation, low risk  Echo 9/17 Mild concentric LVH, EF 55-60, normal wall motion, grade 1 diastolic dysfunction, trivial MR, mild LAE  LHC 9/17 LM ostial 20 LAD ostial 40, D1 stent patent OM1 stent patent RCA ostial 80, mid 95, mid to distal stent patent EF 55-65 PCI: 3 x 16 mm Promus Premier DES to mid RCA, 3.5 x 12 mm Promus Premier DES to the proximal RCA   ASSESSMENT & PLAN:   CAD: stable. He is no longer having chest pain. Continue ASA/plavix, statin and BB  HTN: BP a little elevated today. Will increase Lisinopril from 10mg  to 20mg  and recheck a BMET in 1-2 weeks.   HLD: continue statin   Hx of lower GI bleeding: no blood in stool. Recent H/H 10.9/33.6  DMT2: HgA1c 6.7 in 05/2016. Continue current regimen   Medication Adjustments/Labs and Tests Ordered: Current medicines are reviewed at length with the patient today.  Concerns regarding medicines are outlined above.  Medication changes, Labs and Tests ordered today are listed in the Patient Instructions below. Patient Instructions  Medication Instructions:  Your physician recommends that you continue on your current medications as directed. Please refer to the Current Medication list given to you today.  Labwork: None ordered  Testing/Procedures: None ordered  Follow-Up: Your physician recommends that you schedule a follow-up appointment in:    Any Other Special Instructions Will Be Listed Below (If Applicable).     If you need a refill on your cardiac medications before your next appointment, please call your pharmacy.      Signed, Cline Crock, PA-C  09/20/2016 11:17 AM    [facility] [address] [place], [place], [place]  [address] Phone: [phone]; Fax: [phone]

## 2016-09-20 ENCOUNTER — Other Ambulatory Visit: Payer: Self-pay | Admitting: Physician Assistant

## 2016-09-20 ENCOUNTER — Ambulatory Visit (INDEPENDENT_AMBULATORY_CARE_PROVIDER_SITE_OTHER): Payer: Non-veteran care | Admitting: Physician Assistant

## 2016-09-20 ENCOUNTER — Encounter: Payer: Self-pay | Admitting: Physician Assistant

## 2016-09-20 VITALS — BP 150/92 | HR 60 | Ht 67.0 in | Wt 199.8 lb

## 2016-09-20 DIAGNOSIS — I2510 Atherosclerotic heart disease of native coronary artery without angina pectoris: Secondary | ICD-10-CM | POA: Diagnosis not present

## 2016-09-20 DIAGNOSIS — E785 Hyperlipidemia, unspecified: Secondary | ICD-10-CM

## 2016-09-20 DIAGNOSIS — I10 Essential (primary) hypertension: Secondary | ICD-10-CM

## 2016-09-20 DIAGNOSIS — E118 Type 2 diabetes mellitus with unspecified complications: Secondary | ICD-10-CM

## 2016-09-20 DIAGNOSIS — Z8719 Personal history of other diseases of the digestive system: Secondary | ICD-10-CM | POA: Diagnosis not present

## 2016-09-20 MED ORDER — LISINOPRIL 20 MG PO TABS: 20.0000 mg | ORAL_TABLET | Freq: Every day | ORAL | 3 refills | Status: DC

## 2016-09-20 NOTE — Patient Instructions (Addendum)
Medication Instructions:  Your physician has recommended you make the following change in your medication:  1.  INCREASE the Lisinopril to 20 mg taking 1 tablet daily   Labwork: 1-2 WEEKS:  BMET  Testing/Procedures: None ordered  Follow-Up: Your physician wants you to follow-up in: 4 MONTHS WITH DR. Anne Fu You will receive a reminder letter in the mail two months in advance. If you don't receive a letter, please call our office to schedule the follow-up appointment.   Any Other Special Instructions Will Be Listed Below (If Applicable).     If you need a refill on your cardiac medications before your next appointment, please call your pharmacy.

## 2016-09-28 ENCOUNTER — Ambulatory Visit: Payer: Non-veteran care | Admitting: Cardiology

## 2016-10-01 ENCOUNTER — Other Ambulatory Visit: Payer: Non-veteran care | Admitting: *Deleted

## 2016-10-01 DIAGNOSIS — I2510 Atherosclerotic heart disease of native coronary artery without angina pectoris: Secondary | ICD-10-CM

## 2016-10-01 DIAGNOSIS — I10 Essential (primary) hypertension: Secondary | ICD-10-CM

## 2016-10-01 LAB — BASIC METABOLIC PANEL WITH GFR
BUN/Creatinine Ratio: 10 (ref 9–20)
BUN: 10 mg/dL (ref 6–24)
CO2: 23 mmol/L (ref 20–29)
Calcium: 9.4 mg/dL (ref 8.7–10.2)
Chloride: 99 mmol/L (ref 96–106)
Creatinine, Ser: 1.01 mg/dL (ref 0.76–1.27)
GFR calc Af Amer: 96 mL/min/1.73
GFR calc non Af Amer: 83 mL/min/1.73
Glucose: 198 mg/dL — ABNORMAL HIGH (ref 65–99)
Potassium: 3.7 mmol/L (ref 3.5–5.2)
Sodium: 140 mmol/L (ref 134–144)

## 2016-10-04 ENCOUNTER — Telehealth: Payer: Self-pay | Admitting: Cardiology

## 2016-10-04 NOTE — Telephone Encounter (Signed)
Will route surgical clearance request to Dr. Anne Fu and RN for further review, recommendation, and follow-up with Urology office, thereafter.

## 2016-10-04 NOTE — Telephone Encounter (Signed)
New message          [facility] Pre-operative Risk Assessment    Request for surgical clearance:  What type of surgery is being performed? kidney stone lithotripsy When is this surgery scheduled?  Pending clearance Are there any medications that need to be held prior to surgery and how long? Cardiac clearance 1. Name of physician performing surgery?  Dr  Logan Bores  2. What is your office phone and fax number?  Fax [phone]  and please fax most recent stress test  Silas Sacramento 10/04/2016, 9:04 AM  _________________________________________________________________   (provider comments below)

## 2016-10-08 NOTE — Telephone Encounter (Signed)
Left message for [facility]  Urology clinic of Dr Anne Fu recommendations and to c/b with further questions or concerns.

## 2016-10-08 NOTE — Telephone Encounter (Signed)
[facility] to [facility] to # left

## 2016-10-08 NOTE — Telephone Encounter (Signed)
Recommend postponing lithotripsy if possible until one year after stent placement, 12/21/16. This will give [place] one year of dual antiplatelet therapy prior to cessation.   Donato Schultz, MD

## 2017-03-26 IMAGING — NM NM MISC PROCEDURE
3 series · 18 of 18 positions shown · non-contrast
Comparison: none

[Series 1: stress-sum-em_(id)_sa · 6.4mm · 6.40mm/px · 6 of 64 frames shown]
[frame 6/64]
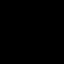
[frame 16/64]
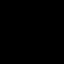
[frame 27/64]
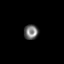
[frame 38/64]
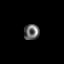
[frame 48/64]
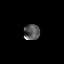
[frame 59/64]
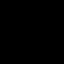

[Series 1: rest_(id)_sa · 6.4mm · 6.40mm/px · 6 of 64 frames shown]
[frame 6/64]
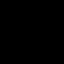
[frame 16/64]
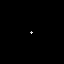
[frame 27/64]
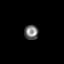
[frame 38/64]
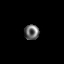
[frame 48/64]
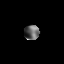
[frame 59/64]
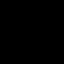

[Series 1: stress-gsp_(id)_sa · 6.4mm · 6.40mm/px · 6 of 512 frames shown]
[frame 43/512]
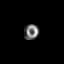
[frame 128/512]
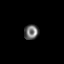
[frame 214/512]
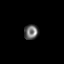
[frame 299/512]
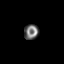
[frame 384/512]
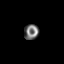
[frame 470/512]
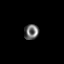

[18 of 18 positions shown; findings below may reference images not displayed]

Canned report from images found in remote index.

Refer to host system for actual result text.

## 2019-03-05 ENCOUNTER — Other Ambulatory Visit: Payer: Self-pay

## 2019-03-05 ENCOUNTER — Emergency Department (HOSPITAL_BASED_OUTPATIENT_CLINIC_OR_DEPARTMENT_OTHER)
Admission: EM | Admit: 2019-03-05 | Discharge: 2019-03-05 | Disposition: A | Discharge status: Home / Self Care | Payer: No Typology Code available for payment source | Attending: Emergency Medicine | Admitting: Emergency Medicine

## 2019-03-05 ENCOUNTER — Encounter (HOSPITAL_BASED_OUTPATIENT_CLINIC_OR_DEPARTMENT_OTHER): Payer: Self-pay | Admitting: *Deleted

## 2019-03-05 ENCOUNTER — Emergency Department (HOSPITAL_BASED_OUTPATIENT_CLINIC_OR_DEPARTMENT_OTHER): Payer: No Typology Code available for payment source

## 2019-03-05 DIAGNOSIS — Z79899 Other long term (current) drug therapy: Secondary | ICD-10-CM | POA: Insufficient documentation

## 2019-03-05 DIAGNOSIS — E876 Hypokalemia: Secondary | ICD-10-CM | POA: Insufficient documentation

## 2019-03-05 DIAGNOSIS — I10 Essential (primary) hypertension: Secondary | ICD-10-CM | POA: Diagnosis not present

## 2019-03-05 DIAGNOSIS — N131 Hydronephrosis with ureteral stricture, not elsewhere classified: Secondary | ICD-10-CM

## 2019-03-05 DIAGNOSIS — Z87891 Personal history of nicotine dependence: Secondary | ICD-10-CM | POA: Diagnosis not present

## 2019-03-05 DIAGNOSIS — I252 Old myocardial infarction: Secondary | ICD-10-CM | POA: Insufficient documentation

## 2019-03-05 DIAGNOSIS — Z9861 Coronary angioplasty status: Secondary | ICD-10-CM | POA: Diagnosis not present

## 2019-03-05 DIAGNOSIS — E1169 Type 2 diabetes mellitus with other specified complication: Secondary | ICD-10-CM | POA: Insufficient documentation

## 2019-03-05 DIAGNOSIS — Z7902 Long term (current) use of antithrombotics/antiplatelets: Secondary | ICD-10-CM | POA: Diagnosis not present

## 2019-03-05 DIAGNOSIS — R1031 Right lower quadrant pain: Secondary | ICD-10-CM | POA: Diagnosis present

## 2019-03-05 DIAGNOSIS — E782 Mixed hyperlipidemia: Secondary | ICD-10-CM | POA: Diagnosis not present

## 2019-03-05 DIAGNOSIS — N132 Hydronephrosis with renal and ureteral calculous obstruction: Secondary | ICD-10-CM | POA: Diagnosis not present

## 2019-03-05 DIAGNOSIS — Z7982 Long term (current) use of aspirin: Secondary | ICD-10-CM | POA: Insufficient documentation

## 2019-03-05 DIAGNOSIS — N201 Calculus of ureter: Secondary | ICD-10-CM

## 2019-03-05 LAB — URINALYSIS, MICROSCOPIC (REFLEX): RBC / HPF: >50 RBC/hpf (ref 0–5)

## 2019-03-05 LAB — BASIC METABOLIC PANEL WITH GFR
Anion gap: 13 (ref 5–15)
BUN: 13 mg/dL (ref 6–20)
CO2: 21 mmol/L — ABNORMAL LOW (ref 22–32)
Calcium: 9.1 mg/dL (ref 8.9–10.3)
Chloride: 100 mmol/L (ref 98–111)
Creatinine, Ser: 1.03 mg/dL (ref 0.61–1.24)
GFR calc Af Amer: >60 mL/min
GFR calc non Af Amer: >60 mL/min
Glucose, Bld: 204 mg/dL — ABNORMAL HIGH (ref 70–99)
Potassium: 3.2 mmol/L — ABNORMAL LOW (ref 3.5–5.1)
Sodium: 134 mmol/L — ABNORMAL LOW (ref 135–145)

## 2019-03-05 LAB — CBC WITH DIFFERENTIAL/PLATELET
Abs Immature Granulocytes: 0.04 K/uL (ref 0.00–0.07)
Basophils Absolute: 0 K/uL (ref 0.0–0.1)
Basophils Relative: 0 %
Eosinophils Absolute: 0 K/uL (ref 0.0–0.5)
Eosinophils Relative: 0 %
HCT: 46.8 % (ref 39.0–52.0)
Hemoglobin: 15.2 g/dL (ref 13.0–17.0)
Immature Granulocytes: 0 %
Lymphocytes Relative: 8 %
Lymphs Abs: 1 K/uL (ref 0.7–4.0)
MCH: 28.1 pg (ref 26.0–34.0)
MCHC: 32.5 g/dL (ref 30.0–36.0)
MCV: 86.7 fL (ref 80.0–100.0)
Monocytes Absolute: 0.6 K/uL (ref 0.1–1.0)
Monocytes Relative: 5 %
Neutro Abs: 10.8 K/uL — ABNORMAL HIGH (ref 1.7–7.7)
Neutrophils Relative %: 87 %
Platelets: 285 K/uL (ref 150–400)
RBC: 5.4 MIL/uL (ref 4.22–5.81)
RDW: 14.6 % (ref 11.5–15.5)
WBC: 12.5 K/uL — ABNORMAL HIGH (ref 4.0–10.5)
nRBC: 0 % (ref 0.0–0.2)

## 2019-03-05 LAB — URINALYSIS, ROUTINE W REFLEX MICROSCOPIC
Bilirubin Urine: NEGATIVE
Glucose, UA: >=500 mg/dL — AB
Ketones, ur: 15 mg/dL — AB
Nitrite: POSITIVE — AB
Protein, ur: NEGATIVE mg/dL
Specific Gravity, Urine: 1.015 (ref 1.005–1.030)
pH: 7 (ref 5.0–8.0)

## 2019-03-05 LAB — LIPASE, BLOOD: Lipase: 25 U/L (ref 11–51)

## 2019-03-05 MED ORDER — TAMSULOSIN HCL 0.4 MG PO CAPS: ORAL_CAPSULE | ORAL | 0 refills | Status: DC

## 2019-03-05 MED ORDER — HYDROMORPHONE HCL 1 MG/ML IJ SOLN
1.0000 mg | Freq: Once | INTRAMUSCULAR | Status: AC
  Administered 2019-03-05: 01:00:00 1 mg via INTRAVENOUS
  Filled 2019-03-05: qty 1

## 2019-03-05 MED ORDER — POTASSIUM CHLORIDE CRYS ER 20 MEQ PO TBCR
40.0000 meq | EXTENDED_RELEASE_TABLET | Freq: Once | ORAL | Status: AC
  Administered 2019-03-05: 02:00:00 40 meq via ORAL
  Filled 2019-03-05: qty 2

## 2019-03-05 MED ORDER — SODIUM CHLORIDE 0.9 % IV SOLN
1.0000 g | Freq: Once | INTRAVENOUS | Status: AC
  Administered 2019-03-05: 01:00:00 1 g via INTRAVENOUS
  Filled 2019-03-05: qty 10

## 2019-03-05 MED ORDER — ONDANSETRON 8 MG PO TBDP: 8.0000 mg | ORAL_TABLET | Freq: Three times a day (TID) | ORAL | 0 refills | Status: DC | PRN

## 2019-03-05 MED ORDER — ONDANSETRON HCL 4 MG/2ML IJ SOLN
4.0000 mg | Freq: Once | INTRAMUSCULAR | Status: AC
  Administered 2019-03-05: 4 mg via INTRAVENOUS
  Filled 2019-03-05: qty 2

## 2019-03-05 MED ORDER — HYDROMORPHONE HCL 1 MG/ML IJ SOLN
1.0000 mg | Freq: Once | INTRAMUSCULAR | Status: AC
  Administered 2019-03-05: 1 mg via INTRAVENOUS
  Filled 2019-03-05: qty 1

## 2019-03-05 MED ORDER — CIPROFLOXACIN HCL 500 MG PO TABS: 500.0000 mg | ORAL_TABLET | Freq: Two times a day (BID) | ORAL | 0 refills | Status: DC

## 2019-03-05 MED ORDER — HYDROMORPHONE HCL 4 MG PO TABS: 4.0000 mg | ORAL_TABLET | ORAL | 0 refills | Status: DC | PRN

## 2019-03-05 NOTE — ED Provider Notes (Signed)
[facility] Provider Note: Antonio Dell, MD, FACEP  CSN: [id number] MRN: [id number] ARRIVAL: 03/05/19 at 0011 ROOM: MH01/[facility]01   CHIEF COMPLAINT  Abdominal Pain   HISTORY OF PRESENT ILLNESS  03/05/19 12:25 AM Antonio [name] is a 58 y.o. male with right flank pain radiating to the right side of the abdomen since about 6:30 PM yesterday evening.  He rates the pain is a 10 out of 10 and cannot characterize it is anything but "pain".  It is not significantly worse with movement.  He has had associated nausea, vomiting and diarrhea.  He has not had a fever.    Past Medical History:  Diagnosis Date  . Diverticulosis   . GERD (gastroesophageal reflux disease)   . Heart attack (HCC) 2011; 2017  . Heart murmur   . Hemorrhoids   . High cholesterol   . History of blood transfusion 2011; 2017; 05/2016   "elated to diverticulitis"  . History of kidney stones   . Hyperlipidemia due to type 2 diabetes mellitus (HCC)   . Hypertension   . Lower GI bleed 2011; 2017; 05/2016  . PTSD (post-traumatic stress disorder)   . Type II diabetes mellitus (HCC)     Past Surgical History:  Procedure Laterality Date  . CARDIAC CATHETERIZATION N/A 12/23/2015   Procedure: Left Heart Cath and Coronary Angiography;  Surgeon: Peter M Swaziland, MD;  Location: [facility] INVASIVE CV LAB;  Service: Cardiovascular;  Laterality: N/A;  . CARDIAC CATHETERIZATION N/A 12/23/2015   Procedure: Coronary Stent Intervention;  Surgeon: Peter M Swaziland, MD;  Location: [facility] INVASIVE CV LAB;  Service: Cardiovascular;  Laterality: N/A;  . COLON RESECTION  2011   12 inches taken out in 2011 at [facility] for diverticulosis  . COLON SURGERY    . COLONOSCOPY N/A 10/18/2012   Procedure: COLONOSCOPY;  Surgeon: Louis Meckel, MD;  Location: [facility] ENDOSCOPY;  Service: Endoscopy;  Laterality: N/A;  . COLONOSCOPY N/A 10/22/2012   Procedure: COLONOSCOPY;  Surgeon: Iva Boop, MD;  Location: [facility] ENDOSCOPY;  Service: Endoscopy;   Laterality: N/A;  . CORONARY ANGIOPLASTY WITH STENT PLACEMENT  2011   at [facility], 2 stents, locations unknown  . ESOPHAGOGASTRODUODENOSCOPY N/A 10/18/2012   Procedure: ESOPHAGOGASTRODUODENOSCOPY (EGD);  Surgeon: Louis Meckel, MD;  Location: [facility] ENDOSCOPY;  Service: Endoscopy;  Laterality: N/A;  . INGUINAL HERNIA REPAIR Right   . IRRIGATION AND DEBRIDEMENT ABSCESS N/A 10/26/2012   Procedure: IRRIGATION AND DEBRIDEMENT PERINEAL ABSCESS;  Surgeon: Wilmon Arms. Corliss Skains, MD;  Location: [facility] OR;  Service: General;  Laterality: N/A;    Family History  Problem Relation Age of Onset  . Alzheimer's disease Mother 76  . Heart attack Father 67  . CAD Brother 40       triple bypass    Social History   Tobacco Use  . Smoking status: Former Smoker    Packs/day: 0.50    Years: 20.00    Pack years: 10.00    Types: Cigarettes    Quit date: 10/19/2002    Years since quitting: 16.3  . Smokeless tobacco: Never Used  Substance Use Topics  . Alcohol use: Yes    Comment: 07/11/2016 "might have a beer/month; if that"  . Drug use: Yes    Types: Marijuana    Prior to Admission medications   Medication Sig Start Date End Date Taking? Authorizing Provider  amLODipine (NORVASC) 5 MG tablet Take 1 tablet (5 mg total) by mouth daily. 01/23/16   Donato Schultz  C, MD  aspirin EC 81 MG tablet Take 1 tablet (81 mg total) by mouth daily. 12/24/15   Little IshikawaSmith, Erin E, NP  atorvastatin (LIPITOR) 80 MG tablet Take 1 tablet (80 mg total) by mouth daily at 6 PM. 01/23/16   Jake BatheSkains, Mark C, MD  carvedilol (COREG) 3.125 MG tablet Take 1 tablet (3.125 mg total) by mouth 2 (two) times daily with a meal. 01/23/16   Jake BatheSkains, Mark C, MD  clopidogrel (PLAVIX) 75 MG tablet Take 1 tablet (75 mg total) by mouth daily. 01/23/16   Jake BatheSkains, Mark C, MD  docusate sodium (COLACE) 100 MG capsule Take 100 mg by mouth daily as needed for mild constipation.    [provider]  insulin aspart protamine - aspart (NOVOLOG MIX 70/30  FLEXPEN) (70-30) 100 UNIT/ML FlexPen Inject 0.35 mLs (35 Units total) into the skin 2 (two) times daily before a meal. 06/20/16   Randel PiggSilva Zapata, Dorma RussellEdwin, MD  lisinopril (PRINIVIL,ZESTRIL) 20 MG tablet Take 1 tablet (20 mg total) by mouth daily. 09/20/16 12/19/16  Janetta Horahompson, Kathryn R, PA-C  metFORMIN (GLUCOPHAGE-XR) 500 MG 24 hr tablet Take 1,000 mg by mouth 2 (two) times daily.    [provider]  nitroGLYCERIN (NITROSTAT) 0.4 MG SL tablet Place 0.4 mg under the tongue every 5 (five) minutes as needed for chest pain.    [provider]  ondansetron (ZOFRAN) 8 MG tablet Take 8 mg by mouth every 8 (eight) hours as needed for nausea or vomiting.    [provider]  pantoprazole (PROTONIX) 40 MG tablet Take 40 mg by mouth daily as needed. For heartburn    [provider]  potassium chloride SA (K-DUR,KLOR-CON) 20 MEQ tablet Take 1 tablet (20 mEq total) by mouth daily. 07/13/16   Noralee Stainhoi, Jennifer, DO    Allergies Patient has no known allergies.   REVIEW OF SYSTEMS  Negative except as noted here or in the History of Present Illness.   PHYSICAL EXAMINATION  Initial Vital Signs Pulse (!) 57, temperature 97.9 F (36.6 C), temperature source Oral, resp. rate 16, height 5\' 7"  (1.702 m), weight 82.1 kg, SpO2 100 %.  Examination General: Well-developed, well-nourished male in no acute distress; appearance consistent with age of record HENT: normocephalic; atraumatic Eyes: pupils equal, round and reactive to light; extraocular muscles intact Neck: supple Heart: regular rate and rhythm Lungs: clear to auscultation bilaterally Abdomen: soft; nondistended; mild right-sided tenderness; bowel sounds present GU: Right CVA tenderness Extremities: No deformity; full range of motion; pulses normal Neurologic: Awake, alert; motor function intact in all extremities and symmetric; no facial droop Skin: Warm and dry Psychiatric: Flat affect; difficult to engage in conversation    RESULTS  Summary of this visit's results, reviewed and interpreted by myself:   EKG Interpretation  Date/Time:    Ventricular Rate:    PR Interval:    QRS Duration:   QT Interval:    QTC Calculation:   R Axis:     Text Interpretation:        Laboratory Studies: Results for orders placed or performed during the hospital encounter of 03/05/19 (from the past 24 hour(s))  Urinalysis, Routine w reflex microscopic     Status: Abnormal   Collection Time: 03/05/19 12:34 AM  Result Value Ref Range   Color, Urine AMBER (A) YELLOW   APPearance HAZY (A) CLEAR   Specific Gravity, Urine 1.015 1.005 - 1.030   pH 7.0 5.0 - 8.0   Glucose, UA >=500 (A) NEGATIVE mg/dL  Hgb urine dipstick LARGE (A) NEGATIVE   Bilirubin Urine NEGATIVE NEGATIVE   Ketones, ur 15 (A) NEGATIVE mg/dL   Protein, ur NEGATIVE NEGATIVE mg/dL   Nitrite POSITIVE (A) NEGATIVE   Leukocytes,Ua TRACE (A) NEGATIVE  CBC with Differential     Status: Abnormal   Collection Time: 03/05/19 12:34 AM  Result Value Ref Range   WBC 12.5 (H) 4.0 - 10.5 K/uL   RBC 5.40 4.22 - 5.81 MIL/uL   Hemoglobin 15.2 13.0 - 17.0 g/dL   HCT 79.1 50.5 - 69.7 %   MCV 86.7 80.0 - 100.0 fL   MCH 28.1 26.0 - 34.0 pg   MCHC 32.5 30.0 - 36.0 g/dL   RDW 94.8 01.6 - 55.3 %   Platelets 285 150 - 400 K/uL   nRBC 0.0 0.0 - 0.2 %   Neutrophils Relative % 87 %   Neutro Abs 10.8 (H) 1.7 - 7.7 K/uL   Lymphocytes Relative 8 %   Lymphs Abs 1.0 0.7 - 4.0 K/uL   Monocytes Relative 5 %   Monocytes Absolute 0.6 0.1 - 1.0 K/uL   Eosinophils Relative 0 %   Eosinophils Absolute 0.0 0.0 - 0.5 K/uL   Basophils Relative 0 %   Basophils Absolute 0.0 0.0 - 0.1 K/uL   Immature Granulocytes 0 %   Abs Immature Granulocytes 0.04 0.00 - 0.07 K/uL  Basic metabolic panel     Status: Abnormal   Collection Time: 03/05/19 12:34 AM  Result Value Ref Range   Sodium 134 (L) 135 - 145 mmol/L   Potassium 3.2 (L) 3.5 - 5.1 mmol/L   Chloride 100 98 - 111 mmol/L   CO2 21 (L)  22 - 32 mmol/L   Glucose, Bld 204 (H) 70 - 99 mg/dL   BUN 13 6 - 20 mg/dL   Creatinine, Ser 7.48 0.61 - 1.24 mg/dL   Calcium 9.1 8.9 - 27.0 mg/dL   GFR calc non Af Amer >60 >60 mL/min   GFR calc Af Amer >60 >60 mL/min   Anion gap 13 5 - 15  Lipase, blood     Status: None   Collection Time: 03/05/19 12:34 AM  Result Value Ref Range   Lipase 25 11 - 51 U/L  Urinalysis, Microscopic (reflex)     Status: Abnormal   Collection Time: 03/05/19 12:34 AM  Result Value Ref Range   RBC / HPF >50 0 - 5 RBC/hpf   WBC, UA 6-10 0 - 5 WBC/hpf   Bacteria, UA FEW (A) NONE SEEN   Squamous Epithelial / LPF 0-5 0 - 5   Imaging Studies: Ct Renal Stone Study  Result Date: 03/05/2019 CLINICAL DATA:  Right lower quadrant pain and flank pain EXAM: CT ABDOMEN AND PELVIS WITHOUT CONTRAST TECHNIQUE: Multidetector CT imaging of the abdomen and pelvis was performed following the standard protocol without IV contrast. COMPARISON:  June 17, 2016 FINDINGS: Lower chest: The visualized heart size within normal limits. No pericardial fluid/thickening. No hiatal hernia. The visualized portions of the lungs are clear. Hepatobiliary: Although limited due to the lack of intravenous contrast, normal in appearance without gross focal abnormality. No evidence of calcified gallstones or biliary ductal dilatation. Pancreas:  Unremarkable.  No surrounding inflammatory changes. Spleen: Normal in size. Although limited due to the lack of intravenous contrast, normal in appearance. Adrenals/Urinary Tract: Again noted is nodular thickening of the right thyroid gland which measures 1.2 cm. Within the proximal right ureter there is an 8 mm calculus causing moderate right pelvicaliectasis.  There is significant right perinephric stranding and renal hypertrophy. The distal right ureter is decompressed. Scattered punctate calcifications seen in the lower pole of the left kidney the largest measuring 3 mm. No left-sided ureteral or bladder calculi  are noted. Stomach/Bowel: The stomach and small bowel are normal in appearance. There is scattered colonic diverticula without diverticulitis. Surgical anastomosis seen at the sigmoid rectal junction. Appendix is normal. Vascular/Lymphatic: There are no enlarged abdominal or pelvic lymph nodes. Scattered aortic atherosclerotic calcifications are seen without aneurysmal dilatation. Reproductive: The prostate is unremarkable. Other: No evidence of abdominal wall mass or hernia. Musculoskeletal: No acute or significant osseous findings. IMPRESSION: 1. 8 mm proximal right ureteral calculus causing moderate right hydronephrosis and perinephric stranding. 2. Diverticula without diverticulitis. 3. Unchanged 1.2 cm right adrenal gland nodule, likely adenoma. 4. Aortic Atherosclerosis (ICD10-I70.0). Electronically Signed   By: Prudencio Pair M.D.   On: 03/05/2019 01:33    ED COURSE and MDM  Nursing notes, initial and subsequent vitals signs, including pulse oximetry, reviewed and interpreted by myself.  Vitals:   03/05/19 0023 03/05/19 0026 03/05/19 0130 03/05/19 0258  BP:  (!) 174/106 (!) 176/105 (!) 178/105  Pulse: (!) 57  (!) 106 98  Resp: 16  20 18   Temp: 97.9 F (36.6 C)     TempSrc: Oral     SpO2: 100%  98% 99%  Weight:      Height:       Medications  ondansetron (ZOFRAN) injection 4 mg (4 mg Intravenous Given 03/05/19 0040)  HYDROmorphone (DILAUDID) injection 1 mg (1 mg Intravenous Given 03/05/19 0038)  HYDROmorphone (DILAUDID) injection 1 mg (1 mg Intravenous Given 03/05/19 0125)  cefTRIAXone (ROCEPHIN) 1 g in sodium chloride 0.9 % 100 mL IVPB (0 g Intravenous Stopped 03/05/19 0208)  potassium chloride SA (KLOR-CON) CR tablet 40 mEq (40 mEq Oral Given 03/05/19 0154)  HYDROmorphone (DILAUDID) injection 1 mg (1 mg Intravenous Given 03/05/19 0254)  ondansetron (ZOFRAN) injection 4 mg (4 mg Intravenous Given 03/05/19 0254)   3:37 AM Pain not completely relieved after 3 mg of Dilaudid patient states he  is ready to go home.  Patient given 1 g of Rocephin IV for possible early infection.  He was advised to follow-up with urology promptly and to call for an appointment later today.   PROCEDURES  Procedures   ED DIAGNOSES     ICD-10-CM   1. Ureterolithiasis  N20.1   2. Hydronephrosis due to obstruction of ureter  N13.2   3. Hypokalemia  E87.6        Jamareon Shimel, Jenny Reichmann, MD 03/05/19 [phone]

## 2019-03-05 NOTE — ED Triage Notes (Signed)
Pt c/o right lower abd pain and n/v/d x 6 hrs

## 2019-03-06 ENCOUNTER — Other Ambulatory Visit: Payer: Self-pay | Admitting: Urology

## 2019-03-06 LAB — URINE CULTURE: Culture: <10000 — AB

## 2019-03-13 NOTE — Patient Instructions (Addendum)
DUE TO COVID-19 ONLY ONE VISITOR IS ALLOWED TO COME WITH YOU AND STAY IN THE WAITING ROOM ONLY DURING PRE OP AND PROCEDURE DAY OF SURGERY. THE 1 VISITOR MAY VISIT WITH YOU AFTER SURGERY IN YOUR PRIVATE ROOM DURING VISITING HOURS ONLY!  YOU NEED TO HAVE A COVID 19 TEST ON_12/17______ @__1 :35 pm_____, THIS TEST MUST BE DONE BEFORE SURGERY, COME  [address], [place] , [address].  [facility]) NCE YOUR COVID TEST IS COMPLETED, PLEASE BEGIN THE QUARANTINE INSTRUCTIONS AS OUTLINED IN YOUR HANDOUT.                Antonio [name]   Your procedure is scheduled on: 03/23/19   Report to [facility] Main  Entrance  Report to admitting at  10:15 AM     Call this number if you have problems the morning of surgery [phone]    Remember: Do not eat food or drink liquids after Midnight.   BRUSH YOUR TEETH MORNING OF SURGERY AND RINSE YOUR MOUTH OUT, NO CHEWING GUM CANDY OR MINTS.     Take these medicines the morning of surgery with A SIP OF WATER: Metoprolol, Amlodipine, Tamsulosin,Protonix  DO NOT TAKE ANY DIABETIC MEDICATIONS DAY OF YOUR SURGERY Hold Jardiance day before surgery none day of surgery   How to Manage Your Diabetes Before and After Surgery  Why is it important to control my blood sugar before and after surgery? . Improving blood sugar levels before and after surgery helps healing and can limit problems. . A way of improving blood sugar control is eating a healthy diet by: o  Eating less sugar and carbohydrates o  Increasing activity/exercise o  Talking with your doctor about reaching your blood sugar goals . High blood sugars (greater than 180 mg/dL) can raise your risk of infections and slow your recovery, so you will need to focus on controlling your diabetes during the weeks before surgery. . Make sure that the doctor who takes care of your diabetes knows about your planned surgery including the date and location.  How do I manage my  blood sugar before surgery? . Check your blood sugar at least 4 times a day, starting 2 days before surgery, to make sure that the level is not too high or low. o Check your blood sugar the morning of your surgery when you wake up and every 2 hours until you get to the Short Stay unit. . If your blood sugar is less than 70 mg/dL, you will need to treat for low blood sugar: o Do not take insulin. o Treat a low blood sugar (less than 70 mg/dL) with  cup of clear juice (cranberry or apple), 4 glucose tablets, OR glucose gel. o Recheck blood sugar in 15 minutes after treatment (to make sure it is greater than 70 mg/dL). If your blood sugar is not greater than 70 mg/dL on recheck, call [phone] for further instructions. . Report your blood sugar to the short stay nurse when you get to Short Stay.  . If you are admitted to the hospital after surgery: o Your blood sugar will be checked by the staff and you will probably be given insulin after surgery (instead of oral diabetes medicines) to make sure you have good blood sugar levels. o The goal for blood sugar control after surgery is 80-180 mg/dL.   WHAT DO I DO ABOUT MY DIABETES MEDICATION?  Marland Kitchen Do not take oral diabetes medicines (pills) the morning of surgery.  Marland Kitchen  The day of surgery, do not take other diabetes injectables, including Byetta (exenatide), Bydureon (exenatide ER), Victoza (liraglutide), or Trulicity (dulaglutide).                         You may not have any metal on your body             Do not wear jewelry, lotions, powders or  deodorant               Men may shave face and neck.   Do not bring valuables to the hospital. [facility].  Contacts, dentures or bridgework may not be worn into surgery.      Patients discharged the day of surgery will not be allowed to drive home.   IF YOU ARE HAVING SURGERY AND GOING HOME THE SAME DAY, YOU MUST HAVE AN ADULT TO DRIVE YOU  HOME AND BE WITH YOU FOR 24 HOURS.   YOU MAY GO HOME BY TAXI OR UBER OR ORTHERWISE, BUT AN ADULT MUST ACCOMPANY YOU HOME AND STAY WITH YOU FOR 24 HOURS.  Name and phone number of your driver:  Special Instructions: N/A              Please read over the following fact sheets you were given: _____________________________________________________________________             [facility] - Preparing for Surgery  Before surgery, you can play an important role.   Because skin is not sterile, your skin needs to be as free of germs as possible.   You can reduce the number of germs on your skin by washing with CHG (chlorahexidine gluconate) soap before surgery.   CHG is an antiseptic cleaner which kills germs and bonds with the skin to continue killing germs even after washing. Please DO NOT use if you have an allergy to CHG or antibacterial soaps.   If your skin becomes reddened/irritated stop using the CHG and inform your nurse when you arrive at Short Stay. .  You may shave your face/neck.  Please follow these instructions carefully:  1.  Shower with CHG Soap the night before surgery and the  morning of Surgery.  2.  If you choose to wash your hair, wash your hair first as usual with your  normal  shampoo.  3.  After you shampoo, rinse your hair and body thoroughly to remove the  shampoo.                                        4.  Use CHG as you would any other liquid soap.  You can apply chg directly  to the skin and wash                       Gently with a scrungie or clean washcloth.  5.  Apply the CHG Soap to your body ONLY FROM THE NECK DOWN.   Do not use on face/ open                           Wound or open sores. Avoid contact with eyes, ears mouth and genitals (private parts).  Wash face,  Genitals (private parts) with your normal soap.             6.  Wash thoroughly, paying special attention to the area where your surgery  will be performed.  7.  Thoroughly  rinse your body with warm water from the neck down.  8.  DO NOT shower/wash with your normal soap after using and rinsing off  the CHG Soap.             9.  Pat yourself dry with a clean towel.            10.  Wear clean pajamas.            11.  Place clean sheets on your bed the night of your first shower and do not  sleep with pets. Day of Surgery : Do not apply any lotions/deodorants the morning of surgery.  Please wear clean clothes to the hospital/surgery center.  FAILURE TO FOLLOW THESE INSTRUCTIONS MAY RESULT IN THE CANCELLATION OF YOUR SURGERY PATIENT SIGNATURE_________________________________  NURSE SIGNATURE__________________________________  ________________________________________________________________________

## 2019-03-16 ENCOUNTER — Other Ambulatory Visit: Payer: Self-pay

## 2019-03-16 ENCOUNTER — Encounter (HOSPITAL_COMMUNITY)
Admission: RE | Admit: 2019-03-16 | Discharge: 2019-03-16 | Disposition: A | Discharge status: Home / Self Care | Payer: No Typology Code available for payment source | Source: Ambulatory Visit | Attending: Urology | Admitting: Urology

## 2019-03-16 ENCOUNTER — Encounter (HOSPITAL_COMMUNITY): Payer: Self-pay

## 2019-03-16 NOTE — Progress Notes (Signed)
PCP - [facility]  Cardiologist - Candee Furbish LOV 2018   Chest x-ray - 2018 epic EKG - 03-16-19 epic Stress Test - 2017  epic ECHO - 2017 epic Cardiac Cath - 2017 epic  Sleep Study -  CPAP -   Fasting Blood Sugar -  Checks Blood Sugar does not check BS at home_____ times a day  Blood Thinner Instructions: Plavix last dose 03-16-19  Aspirin Instructions: last dose 03-15-19  Last Dose:  Anesthesia review: MI , cardiac stents mummur   Patient denies shortness of breath, fever, cough and chest pain at PAT appointment  PHONE CALL APPT PT. DENIED  symptoms   Patient verbalized understanding of instructions that were given to them at the PAT appointment. Patient was also instructed that they will need to review over the PAT instructions again at home before surgery.

## 2019-03-18 NOTE — Progress Notes (Signed)
Anesthesia Chart Review   Case: [id number] Date/Time: 03/23/19 1200   Procedure: CYSTOSCOPY/URETEROSCOPY/HOLMIUM LASER/STENT PLACEMENT (Right ) - ONLY NEEDS 60 MIN   Anesthesia type: General   Pre-op diagnosis: RIGHT URETERAL CALCULUS   Location: [facility] OR ROOM 1 / [facility]   Surgeons: Heloise Purpura, MD      DISCUSSION:58 y.o. former smoker (10 pack years, quit 10/19/02) with h/o HTN, GERD, DM II, HLD, CAD (multiple PCIs with most recent DES x2 to RCA 12/2015), right ureteral calculus scheduled for above procedure 03/23/2019 with Dr. Heloise Purpura.   Pt last seen by cardiology 09/20/2016, stable at this visit.   Discussed with Dr. Salvadore Farber, anticipate pt can proceed with planned procedure barring acute status change.   VS: There were no vitals taken for this visit.  PROVIDERS: Clinic, Lenn Sink is PCP   Donato Schultz, MD is Cardiologist  LABS: Labs reviewed: Acceptable for surgery. (all labs ordered are listed, but only abnormal results are displayed)  Labs Reviewed - No data to display   IMAGES:   EKG:   CV: Myocardial Perfusion 01/17/2016  Nuclear stress EF is calculated at 45% but appears higher visually. Recommend correlation with echo.  Baseline EKG showed NSR with nonspecific T wave abnormality in the inferolateral leads.  During exercise there was significant wandering baseline artifact makindg interpretation difficult. In recovery there was 67mm of horizontal ST segement depression with more prominent T wave inversions.  There is a small defect of mild severity present in the basal inferior and mid inferior location. The defect is non-reversible and consistent with diaphragmatic attenuation. No ischemia noted.  This is a low risk study.  The left ventricular ejection fraction is mildly decreased (45-54%).  Blood pressure demonstrated a hypertensive response to exercise.   Echo 12/28/2015 Study Conclusions  - Left ventricle: The cavity size  was normal. There was mild   concentric hypertrophy. Systolic function was normal. The   estimated ejection fraction was in the range of 55% to 60%. Wall   motion was normal; there were no regional wall motion   abnormalities. Doppler parameters are consistent with abnormal   left ventricular relaxation (grade 1 diastolic dysfunction).   There was no evidence of elevated ventricular filling pressure by   Doppler parameters. - Aortic valve: Trileaflet; normal thickness leaflets. There was no   regurgitation. - Aortic root: The aortic root was normal in size. - Mitral valve: Structurally normal valve. There was trivial   regurgitation. - Left atrium: The atrium was mildly dilated. - Right ventricle: Systolic function was normal. - Right atrium: The atrium was normal in size. - Tricuspid valve: There was no regurgitation. - Pulmonic valve: There was no regurgitation. - Pulmonary arteries: Systolic pressure could not be accurately   estimated. - Inferior vena cava: The vessel was normal in size. - Pericardium, extracardiac: There was no pericardial effusion.  Cardiac Cath 12/23/15   Ost LM lesion, 20 %stenosed.  Ost LAD lesion, 40 %stenosed.  Stent to the first diagonal, 0 %stenosed.  STent to 1st Mrg lesion, 0 %stenosed.  Stent to Dist RCA lesion, 0 %stenosed.  The left ventricular systolic function is normal.  LV end diastolic pressure is normal.  The left ventricular ejection fraction is 55-65% by visual estimate.  A STENT PROMUS PREM MR 3.0X16 drug eluting stent was successfully placed.  Mid RCA lesion, 95 %stenosed.  Post intervention, there is a 0% residual stenosis.  A STENT PROMUS PREM MR 3.5X12 drug eluting stent was successfully  placed.  Ost RCA to Prox RCA lesion, 80 %stenosed.  Post intervention, there is a 0% residual stenosis.   1. Single vessel obstructive CAD- 80% proximal RCA, 95% mid RCA 2. Patent old stents in the first diagonal, first OM, and  distal RCA 3. Normal LV function with normal LVEDP 4. Successful stenting of the mid RCA with a DES 5. Successful stenting of the proximal RCA with DES  Plan: DAPT. ASA Only 81 mg daily and Brilinta 90 mg bid. Patient may be a candidate for the TWILIGHT trial. Anticipate DC in am.  Past Medical History:  Diagnosis Date  . Coronary artery disease   . Diverticulosis   . GERD (gastroesophageal reflux disease)   . Heart attack ([place]) 2011; 2017  . Heart murmur   . Hemorrhoids   . High cholesterol   . History of blood transfusion 2011; 2017; 05/2016   "elated to diverticulitis"  . History of kidney stones   . Hyperlipidemia due to type 2 diabetes mellitus ([place])   . Hypertension   . Lower GI bleed 2011; 2017; 05/2016  . PTSD (post-traumatic stress disorder)    PTSD  . Type II diabetes mellitus ([place])     Past Surgical History:  Procedure Laterality Date  . CARDIAC CATHETERIZATION N/A 12/23/2015   Procedure: Left Heart Cath and Coronary Angiography;  Surgeon: Peter M Martinique, MD;  Location: [place] [facility];  Service: Cardiovascular;  Laterality: N/A;  . CARDIAC CATHETERIZATION N/A 12/23/2015   Procedure: Coronary Stent Intervention;  Surgeon: Peter M Martinique, MD;  Location: [place] [facility];  Service: Cardiovascular;  Laterality: N/A;  . COLON RESECTION  2011   12 inches taken out in 2011 at [facility] for diverticulosis  . COLON SURGERY    . COLONOSCOPY N/A 10/18/2012   Procedure: COLONOSCOPY;  Surgeon: Inda Castle, MD;  Location: [place];  Service: Endoscopy;  Laterality: N/A;  . COLONOSCOPY N/A 10/22/2012   Procedure: COLONOSCOPY;  Surgeon: Gatha Mayer, MD;  Location: [place];  Service: Endoscopy;  Laterality: N/A;  . CORONARY ANGIOPLASTY WITH STENT PLACEMENT  2011   at [facility], 2 stents, locations unknown  . ESOPHAGOGASTRODUODENOSCOPY N/A 10/18/2012   Procedure: ESOPHAGOGASTRODUODENOSCOPY (EGD);  Surgeon: Inda Castle, MD;  Location: [place];  Service: Endoscopy;  Laterality: N/A;  . INGUINAL HERNIA REPAIR Right   . IRRIGATION AND DEBRIDEMENT ABSCESS N/A 10/26/2012   Procedure: IRRIGATION AND DEBRIDEMENT PERINEAL ABSCESS;  Surgeon: Imogene Burn. Georgette Dover, MD;  Location: [place];  Service: General;  Laterality: N/A;    MEDICATIONS: . amLODipine (NORVASC) 10 MG tablet  . aspirin EC 81 MG tablet  . Cholecalciferol (VITAMIN D) 50 MCG (2000 UT) CAPS  . clopidogrel (PLAVIX) 75 MG tablet  . empagliflozin (JARDIANCE) 25 MG TABS tablet  . HYDROmorphone (DILAUDID) 4 MG tablet  . lisinopril (ZESTRIL) 40 MG tablet  . metFORMIN (GLUCOPHAGE) 500 MG tablet  . metoprolol (TOPROL-XL) 200 MG 24 hr tablet  . nitroGLYCERIN (NITROSTAT) 0.4 MG SL tablet  . ondansetron (ZOFRAN ODT) 8 MG disintegrating tablet  . pantoprazole (PROTONIX) 40 MG tablet  . Semaglutide,0.25 or 0.5MG /DOS, (OZEMPIC, 0.25 OR 0.5 MG/DOSE,) 2 MG/1.5ML SOPN  . tamsulosin (FLOMAX) 0.4 MG CAPS capsule  . vitamin B-12 (CYANOCOBALAMIN) 1000 MCG tablet   No current facility-administered medications for this encounter.    Maia Plan [facility] Pre-Surgical Testing [phone] 03/18/19  2:22 PM

## 2019-03-19 ENCOUNTER — Encounter (HOSPITAL_COMMUNITY): Payer: No Typology Code available for payment source

## 2019-03-19 ENCOUNTER — Encounter (HOSPITAL_COMMUNITY)
Admission: RE | Admit: 2019-03-19 | Discharge: 2019-03-19 | Disposition: A | Discharge status: Home / Self Care | Payer: No Typology Code available for payment source | Source: Ambulatory Visit | Attending: Urology | Admitting: Urology

## 2019-03-19 ENCOUNTER — Other Ambulatory Visit: Payer: Self-pay

## 2019-03-19 ENCOUNTER — Other Ambulatory Visit (HOSPITAL_COMMUNITY)
Admission: RE | Admit: 2019-03-19 | Discharge: 2019-03-19 | Disposition: A | Discharge status: Home / Self Care | Payer: No Typology Code available for payment source | Source: Ambulatory Visit | Attending: Urology | Admitting: Urology

## 2019-03-19 DIAGNOSIS — E119 Type 2 diabetes mellitus without complications: Secondary | ICD-10-CM | POA: Insufficient documentation

## 2019-03-19 DIAGNOSIS — Z20828 Contact with and (suspected) exposure to other viral communicable diseases: Secondary | ICD-10-CM | POA: Diagnosis not present

## 2019-03-19 DIAGNOSIS — N201 Calculus of ureter: Secondary | ICD-10-CM | POA: Diagnosis present

## 2019-03-19 DIAGNOSIS — Z87442 Personal history of urinary calculi: Secondary | ICD-10-CM | POA: Diagnosis not present

## 2019-03-19 DIAGNOSIS — Z955 Presence of coronary angioplasty implant and graft: Secondary | ICD-10-CM | POA: Insufficient documentation

## 2019-03-19 DIAGNOSIS — Z7982 Long term (current) use of aspirin: Secondary | ICD-10-CM | POA: Insufficient documentation

## 2019-03-19 DIAGNOSIS — Z79899 Other long term (current) drug therapy: Secondary | ICD-10-CM | POA: Diagnosis not present

## 2019-03-19 DIAGNOSIS — Z87891 Personal history of nicotine dependence: Secondary | ICD-10-CM | POA: Diagnosis not present

## 2019-03-19 DIAGNOSIS — I2510 Atherosclerotic heart disease of native coronary artery without angina pectoris: Secondary | ICD-10-CM | POA: Diagnosis not present

## 2019-03-19 DIAGNOSIS — Z7902 Long term (current) use of antithrombotics/antiplatelets: Secondary | ICD-10-CM | POA: Diagnosis not present

## 2019-03-19 DIAGNOSIS — I10 Essential (primary) hypertension: Secondary | ICD-10-CM | POA: Diagnosis not present

## 2019-03-19 DIAGNOSIS — N132 Hydronephrosis with renal and ureteral calculous obstruction: Secondary | ICD-10-CM | POA: Diagnosis not present

## 2019-03-19 DIAGNOSIS — E7800 Pure hypercholesterolemia, unspecified: Secondary | ICD-10-CM | POA: Diagnosis not present

## 2019-03-19 DIAGNOSIS — K219 Gastro-esophageal reflux disease without esophagitis: Secondary | ICD-10-CM | POA: Diagnosis not present

## 2019-03-19 DIAGNOSIS — Z01818 Encounter for other preprocedural examination: Secondary | ICD-10-CM | POA: Diagnosis present

## 2019-03-19 DIAGNOSIS — Z7984 Long term (current) use of oral hypoglycemic drugs: Secondary | ICD-10-CM | POA: Insufficient documentation

## 2019-03-19 DIAGNOSIS — I252 Old myocardial infarction: Secondary | ICD-10-CM | POA: Diagnosis not present

## 2019-03-19 LAB — BASIC METABOLIC PANEL
Anion gap: 13 (ref 5–15)
BUN: 9 mg/dL (ref 6–20)
CO2: 25 mmol/L (ref 22–32)
Calcium: 9.2 mg/dL (ref 8.9–10.3)
Chloride: 102 mmol/L (ref 98–111)
Creatinine, Ser: 0.98 mg/dL (ref 0.61–1.24)
GFR calc Af Amer: >60 mL/min (ref >60)
GFR calc non Af Amer: >60 mL/min (ref >60)
Glucose, Bld: 134 mg/dL — ABNORMAL HIGH (ref 70–99)
Potassium: 3.4 mmol/L — ABNORMAL LOW (ref 3.5–5.1)
Sodium: 140 mmol/L (ref 135–145)

## 2019-03-19 LAB — CBC
HCT: 43.2 % (ref 39.0–52.0)
Hemoglobin: 13.7 g/dL (ref 13.0–17.0)
MCH: 28.1 pg (ref 26.0–34.0)
MCHC: 31.7 g/dL (ref 30.0–36.0)
MCV: 88.7 fL (ref 80.0–100.0)
Platelets: 319 10*3/uL (ref 150–400)
RBC: 4.87 MIL/uL (ref 4.22–5.81)
RDW: 14 % (ref 11.5–15.5)
WBC: 6.6 10*3/uL (ref 4.0–10.5)
nRBC: 0 % (ref 0.0–0.2)

## 2019-03-19 LAB — HEMOGLOBIN A1C
Hgb A1c MFr Bld: 7.2 % — ABNORMAL HIGH (ref 4.8–5.6)
Mean Plasma Glucose: 159.94 mg/dL

## 2019-03-19 LAB — GLUCOSE, CAPILLARY: Glucose-Capillary: 129 mg/dL — ABNORMAL HIGH (ref 70–99)

## 2019-03-20 LAB — NOVEL CORONAVIRUS, NAA (HOSP ORDER, SEND-OUT TO REF LAB; TAT 18-24 HRS): SARS-CoV-2, NAA: NOT DETECTED

## 2019-03-20 NOTE — H&P (Signed)
Office Visit Report     03/05/2019   --------------------------------------------------------------------------------   Antonio [name]  MRN: [id number]  DOB: 06-20-1960, 58 year old Male  SSN:    PRIMARY CARE:    REFERRING:  John L. Molpus, MD  PROVIDER:  Heloise Purpura, M.D.  LOCATION:  [facility] [phone]     --------------------------------------------------------------------------------   CC/HPI: Right UPJ calculus   Antonio [name] is a 58 year old gentleman with a history of significant coronary artery disease status post multiple myocardial infarctions and multiple cardiac stent placements. His most recent cardiac stent placement was in 2017. He is still on Plavix and aspirin 81 mg. He does not have a regular cardiologist in his been continuing follow-up at the [facility]. He denies any recent chest pain, shortness of breath, or palpitations. He does have a history of urolithiasis and estimates that he has passed approximately 5 stones previously. He has never required any intervention. His last stone episode was a few years ago. He developed the acute onset of right-sided flank and right upper quadrant abdominal pain last evening. This was associated with nausea and vomiting. He has denied fever or hematuria. He presented to the emergency department and a CT stone study indicated an 8 mm proximal right ureteral stone. His pain was controlled and he was discharged home in follows up today for further evaluation. His pain has been relatively well controlled since discharge. He was placed on ciprofloxacin and a urine culture sent as a precaution.     ALLERGIES: None   MEDICATIONS: Aspirin 81 mg tablet,chewable  Lisinopril  Metformin Hcl  Metoprolol Succinate  Amlodipine Besylate  Atorvastatin Calcium  Cholocalcip  Clopidogrel 75 mg tablet  Empagliflozin  Pantoprazole Sodium  Vitamin B 12  Vitamin D3     GU PSH: None     PSH Notes: Colon Surgery 2010    NON-GU PSH: Cardiac Stent Placement     GU PMH: None   NON-GU PMH: Diabetes Type 2 GERD Hypercholesterolemia Hypertension Myocardial Infarction    FAMILY HISTORY: Alzheimer's Disease - Mother Diabetes - Runs in Family Heart Disease - Runs in Family Prostate Cancer - Brother    Notes: parents deceased, 2 sons, 2 daughters   SOCIAL HISTORY: Marital Status: Married Preferred Language: English; Ethnicity: Not Hispanic Or Latino; Race: Black or African American Current Smoking Status: Patient does not smoke anymore. Has not smoked since 03/03/1999. Smoked for 6 years. Smoked 1/2 pack per day.   Tobacco Use Assessment Completed: Used Tobacco in last 30 days? Drinks 1 drink per day. Social Drinker.  Drinks 1 caffeinated drink per day.    REVIEW OF SYSTEMS:    GU Review Male:   Patient reports frequent urination and get up at night to urinate. Patient denies hard to postpone urination, burning/ pain with urination, leakage of urine, stream starts and stops, trouble starting your streams, and have to strain to urinate .  Gastrointestinal (Lower):   Patient reports diarrhea. Patient denies constipation.  Gastrointestinal (Upper):   Patient reports nausea and vomiting.   Constitutional:   Patient reports night sweats and weight loss. Patient denies fever and fatigue.  Skin:   Patient denies skin rash/ lesion and itching.  Eyes:   Patient denies blurred vision and double vision.  Ears/ Nose/ Throat:   Patient denies sore throat and sinus problems.  Hematologic/Lymphatic:   Patient denies swollen glands and easy bruising.  Cardiovascular:   Patient denies leg swelling and chest pains.  Respiratory:  Patient denies shortness of breath and cough.  Endocrine:   Patient reports excessive thirst.   Musculoskeletal:   Patient reports joint pain. Patient denies back pain.  Neurological:   Patient denies headaches and dizziness.  Psychologic:   Patient denies depression and anxiety.    VITAL SIGNS:      03/05/2019 03:01 PM  Weight 180 lb / 81.65 kg  Height 67 in / 170.18 cm  BP 185/93 mmHg  Pulse 97 /min  Temperature 98.2 F / 36.7 C  BMI 28.2 kg/m   MULTI-SYSTEM PHYSICAL EXAMINATION:    Constitutional: Well-nourished. No physical deformities. Normally developed. Good grooming.  Neck: Neck symmetrical, not swollen. Normal tracheal position.  Respiratory: No labored breathing, no use of accessory muscles. Clear bilaterally.  Cardiovascular: Normal temperature, normal extremity pulses, no swelling, no varicosities. Regular rate and rhythm.  Lymphatic: No enlargement of neck, axillae, groin.  Skin: No paleness, no jaundice, no cyanosis. No lesion, no ulcer, no rash.  Neurologic / Psychiatric: Oriented to time, oriented to place, oriented to person. No depression, no anxiety, no agitation.  Gastrointestinal: No mass, no tenderness, no rigidity, non obese abdomen. No CVA tenderness.  Eyes: Normal conjunctivae. Normal eyelids.  Ears, Nose, Mouth, and Throat: Left ear no scars, no lesions, no masses. Right ear no scars, no lesions, no masses. Nose no scars, no lesions, no masses. Normal hearing. Normal lips.  Musculoskeletal: Normal gait and station of head and neck.     PAST DATA REVIEWED:  Source Of History:  Patient  Urine Test Review:   Urinalysis  X-Ray Review: KUB: Reviewed Films.  C.T. Abdomen/Pelvis: Reviewed Films.    Notes:                     CLINICAL DATA: Right lower quadrant pain and flank pain   EXAM:  CT ABDOMEN AND PELVIS WITHOUT CONTRAST   TECHNIQUE:  Multidetector CT imaging of the abdomen and pelvis was performed  following the standard protocol without IV contrast.   COMPARISON: June 17, 2016   FINDINGS:  Lower chest: The visualized heart size within normal limits. No  pericardial fluid/thickening.   No hiatal hernia.   The visualized portions of the lungs are clear.   Hepatobiliary: Although limited due to the lack of intravenous   contrast, normal in appearance without gross focal abnormality. No  evidence of calcified gallstones or biliary ductal dilatation.   Pancreas: Unremarkable. No surrounding inflammatory changes.   Spleen: Normal in size. Although limited due to the lack of  intravenous contrast, normal in appearance.   Adrenals/Urinary Tract: Again noted is nodular thickening of the  right thyroid gland which measures 1.2 cm. Within the proximal right  ureter there is an 8 mm calculus causing moderate right  pelvicaliectasis. There is significant right perinephric stranding  and renal hypertrophy. The distal right ureter is decompressed.  Scattered punctate calcifications seen in the lower pole of the left  kidney the largest measuring 3 mm. No left-sided ureteral or bladder  calculi are noted.   Stomach/Bowel: The stomach and small bowel are normal in appearance.  There is scattered colonic diverticula without diverticulitis.  Surgical anastomosis seen at the sigmoid rectal junction. Appendix  is normal.   Vascular/Lymphatic: There are no enlarged abdominal or pelvic lymph  nodes. Scattered aortic atherosclerotic calcifications are seen  without aneurysmal dilatation.   Reproductive: The prostate is unremarkable.   Other: No evidence of abdominal wall mass or hernia.   Musculoskeletal: No acute  or significant osseous findings.   IMPRESSION:  1. 8 mm proximal right ureteral calculus causing moderate right  hydronephrosis and perinephric stranding.  2. Diverticula without diverticulitis.  3. Unchanged 1.2 cm right adrenal gland nodule, likely adenoma.  4. Aortic Atherosclerosis (ICD10-I70.0).    Electronically Signed  By: Jonna Clark M.D.  On: 03/05/2019 01:33   Creatinine 1.03   I independently reviewed his KUB x-ray. This demonstrates a seven 8 mm calcification in the vicinity of the proximal right ureter. This is just adjacent to the renal shadow.   PROCEDURES:         KUB - [id number]   A single view of the abdomen is obtained.      Patient confirmed No Neulasta OnPro Device.           Urinalysis w/Scope Dipstick Dipstick Cont'd Micro  Color: Yellow Bilirubin: Neg mg/dL WBC/hpf: 10 - 10/RPR  Appearance: Clear Ketones: Neg mg/dL RBC/hpf: 10 - 94/VOP  Specific Gravity: 1.025 Blood: 2+ ery/uL Bacteria: Rare (0-9/hpf)  pH: 6.0 Protein: 2+ mg/dL Cystals: NS (Not Seen)  Glucose: 3+ mg/dL Urobilinogen: 0.2 mg/dL Casts: Hyaline    Nitrites: Neg Trichomonas: Not Present    Leukocyte Esterase: 1+ leu/uL Mucous: Present      Epithelial Cells: 0 - 5/hpf      Yeast: NS (Not Seen)      Sperm: Not Present    ASSESSMENT:      ICD-10 Details  1 GU:   Ureteral calculus - N20.1    PLAN:            Medications New Meds: Tamsulosin Hcl 0.4 mg capsule 1 capsule PO Q HS   #14  0 Refill(s)            Orders Labs Urine Culture  X-Rays: KUB          Schedule Return Visit/Planned Activity: Other See Visit Notes             Note: Will call to schedule surgery          Document Letter(s):  Created for Patient: Clinical Summary         Notes:   1. Right UPJ calculus: Considering his stone size, he understands that he is unlikely to spontaneously pass this stone. However, he has been provided tamsulosin and a urine strainer to begin medical expulsion therapy in the meantime. He does have adequate pain medication with hydromorphone and also has antiemetic medication. He will continue his ciprofloxacin until he hears the results of his urine culture from the emergency department although his urinalysis does not look highly suspicious for infection.   Considering the size of his stone, I did recommend that we tentatively schedule him for definitive treatment. We reviewed options. Unfortunately, he is not a candidate for shockwave lithotripsy as he will need to continue anti-platelet therapy with aspirin and his stone is too close to the renal shadow to allow for shockwave  lithotripsy. As such, we discussed ureteroscopic laser lithotripsy in detail and I did recommend that he undergo this procedure. He understands that he will require a postoperative ureteral stent. We did review the potential risks, complications, and the expected recovery process. He will need to stop Plavix 5 days prior but will continue aspirin 81 mg perioperatively. He will call me should he develop fever, uncontrolled pain, or persistent nausea/vomiting.   Cc:    * Signed by Heloise Purpura, M.D. on 03/05/19 at 5:38 PM (EST)*

## 2019-03-22 NOTE — Anesthesia Preprocedure Evaluation (Addendum)
Anesthesia Evaluation  Patient identified by MRN, date of birth, ID band Patient awake    Reviewed: Allergy & Precautions, NPO status , Patient's Chart, lab work & pertinent test results  Airway Mallampati: I  TM Distance: >3 FB Neck ROM: Full    Dental no notable dental hx. (+) Partial Lower, Partial Upper,    Pulmonary former smoker,    Pulmonary exam normal breath sounds clear to auscultation       Cardiovascular hypertension, Pt. on medications and Pt. on home beta blockers + CAD and + Past MI  Normal cardiovascular exam Rhythm:Regular Rate:Normal     Neuro/Psych Anxiety negative neurological ROS     GI/Hepatic GERD  ,  Endo/Other  diabetes, Well Controlled, Type 2  Renal/GU K+ 3.4     Musculoskeletal negative musculoskeletal ROS (+)   Abdominal   Peds  Hematology Hgb 13.7   Anesthesia Other Findings   Reproductive/Obstetrics                            Anesthesia Physical Anesthesia Plan  ASA: III  Anesthesia Plan: General   Post-op Pain Management:    Induction: Intravenous  PONV Risk Score and Plan: Treatment may vary due to age or medical condition, Ondansetron, Dexamethasone and Midazolam  Airway Management Planned: LMA  Additional Equipment: None  Intra-op Plan:   Post-operative Plan:   Informed Consent: I have reviewed the patients History and Physical, chart, labs and discussed the procedure including the risks, benefits and alternatives for the proposed anesthesia with the patient or authorized representative who has indicated his/her understanding and acceptance.     Dental advisory given  Plan Discussed with:   Anesthesia Plan Comments:        Anesthesia Quick Evaluation

## 2019-03-23 ENCOUNTER — Encounter (HOSPITAL_BASED_OUTPATIENT_CLINIC_OR_DEPARTMENT_OTHER)
Admission: RE | Disposition: A | Discharge status: Home / Self Care | Payer: Self-pay | Source: Home / Self Care | Attending: Urology

## 2019-03-23 ENCOUNTER — Encounter (HOSPITAL_BASED_OUTPATIENT_CLINIC_OR_DEPARTMENT_OTHER): Payer: Self-pay | Admitting: Urology

## 2019-03-23 ENCOUNTER — Ambulatory Visit (HOSPITAL_BASED_OUTPATIENT_CLINIC_OR_DEPARTMENT_OTHER)
Admission: RE | Admit: 2019-03-23 | Discharge: 2019-03-23 | Disposition: A | Discharge status: Home / Self Care | Payer: No Typology Code available for payment source | Attending: Urology | Admitting: Urology

## 2019-03-23 ENCOUNTER — Ambulatory Visit (HOSPITAL_BASED_OUTPATIENT_CLINIC_OR_DEPARTMENT_OTHER): Payer: No Typology Code available for payment source | Admitting: Physician Assistant

## 2019-03-23 DIAGNOSIS — E119 Type 2 diabetes mellitus without complications: Secondary | ICD-10-CM | POA: Insufficient documentation

## 2019-03-23 DIAGNOSIS — Z7982 Long term (current) use of aspirin: Secondary | ICD-10-CM | POA: Insufficient documentation

## 2019-03-23 DIAGNOSIS — Z955 Presence of coronary angioplasty implant and graft: Secondary | ICD-10-CM | POA: Insufficient documentation

## 2019-03-23 DIAGNOSIS — Z87891 Personal history of nicotine dependence: Secondary | ICD-10-CM | POA: Insufficient documentation

## 2019-03-23 DIAGNOSIS — K219 Gastro-esophageal reflux disease without esophagitis: Secondary | ICD-10-CM | POA: Insufficient documentation

## 2019-03-23 DIAGNOSIS — E7800 Pure hypercholesterolemia, unspecified: Secondary | ICD-10-CM | POA: Insufficient documentation

## 2019-03-23 DIAGNOSIS — I2510 Atherosclerotic heart disease of native coronary artery without angina pectoris: Secondary | ICD-10-CM | POA: Insufficient documentation

## 2019-03-23 DIAGNOSIS — N132 Hydronephrosis with renal and ureteral calculous obstruction: Secondary | ICD-10-CM | POA: Insufficient documentation

## 2019-03-23 DIAGNOSIS — Z79899 Other long term (current) drug therapy: Secondary | ICD-10-CM | POA: Insufficient documentation

## 2019-03-23 DIAGNOSIS — Z7984 Long term (current) use of oral hypoglycemic drugs: Secondary | ICD-10-CM | POA: Insufficient documentation

## 2019-03-23 DIAGNOSIS — I252 Old myocardial infarction: Secondary | ICD-10-CM | POA: Insufficient documentation

## 2019-03-23 DIAGNOSIS — I10 Essential (primary) hypertension: Secondary | ICD-10-CM | POA: Insufficient documentation

## 2019-03-23 DIAGNOSIS — Z7902 Long term (current) use of antithrombotics/antiplatelets: Secondary | ICD-10-CM | POA: Insufficient documentation

## 2019-03-23 DIAGNOSIS — Z87442 Personal history of urinary calculi: Secondary | ICD-10-CM | POA: Insufficient documentation

## 2019-03-23 HISTORY — PX: CYSTOSCOPY/URETEROSCOPY/HOLMIUM LASER/STENT PLACEMENT: SHX6546

## 2019-03-23 LAB — GLUCOSE, CAPILLARY
Glucose-Capillary: 135 mg/dL — ABNORMAL HIGH (ref 70–99)
Glucose-Capillary: 102 mg/dL — ABNORMAL HIGH (ref 70–99)

## 2019-03-23 SURGERY — CYSTOSCOPY/URETEROSCOPY/HOLMIUM LASER/STENT PLACEMENT
Anesthesia: General | Site: Renal | Laterality: Right

## 2019-03-23 MED ORDER — DIPHENHYDRAMINE HCL 50 MG/ML IJ SOLN
INTRAMUSCULAR | Status: AC
  Filled 2019-03-23: qty 1

## 2019-03-23 MED ORDER — HYDROCODONE-ACETAMINOPHEN 5-325 MG PO TABS: 1.0000 | ORAL_TABLET | Freq: Four times a day (QID) | ORAL | 0 refills | Status: DC | PRN

## 2019-03-23 MED ORDER — SODIUM CHLORIDE 0.9 % IR SOLN
Status: DC | PRN
  Administered 2019-03-23: 3000 mL

## 2019-03-23 MED ORDER — FENTANYL CITRATE (PF) 100 MCG/2ML IJ SOLN
INTRAMUSCULAR | Status: DC | PRN
  Administered 2019-03-23 (×2): 50 ug via INTRAVENOUS

## 2019-03-23 MED ORDER — MIDAZOLAM HCL 2 MG/2ML IJ SOLN
INTRAMUSCULAR | Status: AC
  Filled 2019-03-23: qty 2

## 2019-03-23 MED ORDER — MEPERIDINE HCL 25 MG/ML IJ SOLN
6.2500 mg | INTRAMUSCULAR | Status: DC | PRN
  Filled 2019-03-23: qty 1

## 2019-03-23 MED ORDER — DIPHENHYDRAMINE HCL 50 MG/ML IJ SOLN
INTRAMUSCULAR | Status: DC | PRN
  Administered 2019-03-23: 6.25 mg via INTRAVENOUS

## 2019-03-23 MED ORDER — ACETAMINOPHEN 10 MG/ML IV SOLN
1000.0000 mg | Freq: Once | INTRAVENOUS | Status: DC | PRN
  Filled 2019-03-23: qty 100

## 2019-03-23 MED ORDER — PROPOFOL 10 MG/ML IV BOLUS
INTRAVENOUS | Status: DC | PRN
  Administered 2019-03-23: 150 mg via INTRAVENOUS
  Administered 2019-03-23: 20 mg via INTRAVENOUS

## 2019-03-23 MED ORDER — MIDAZOLAM HCL 2 MG/2ML IJ SOLN
INTRAMUSCULAR | Status: DC | PRN
  Administered 2019-03-23 (×2): 1 mg via INTRAVENOUS

## 2019-03-23 MED ORDER — PROMETHAZINE HCL 25 MG/ML IJ SOLN
6.2500 mg | INTRAMUSCULAR | Status: DC | PRN
  Filled 2019-03-23: qty 1

## 2019-03-23 MED ORDER — LIDOCAINE 2% (20 MG/ML) 5 ML SYRINGE
INTRAMUSCULAR | Status: AC
  Filled 2019-03-23: qty 5

## 2019-03-23 MED ORDER — HYDROMORPHONE HCL 1 MG/ML IJ SOLN
0.2500 mg | INTRAMUSCULAR | Status: DC | PRN
  Filled 2019-03-23: qty 0.5

## 2019-03-23 MED ORDER — LACTATED RINGERS IV SOLN
INTRAVENOUS | Status: DC
  Filled 2019-03-23: qty 1000

## 2019-03-23 MED ORDER — ONDANSETRON HCL 4 MG/2ML IJ SOLN
INTRAMUSCULAR | Status: AC
  Filled 2019-03-23: qty 2

## 2019-03-23 MED ORDER — CEFAZOLIN SODIUM-DEXTROSE 2-4 GM/100ML-% IV SOLN
INTRAVENOUS | Status: AC
  Filled 2019-03-23: qty 100

## 2019-03-23 MED ORDER — KETOROLAC TROMETHAMINE 30 MG/ML IJ SOLN
INTRAMUSCULAR | Status: AC
  Filled 2019-03-23: qty 1

## 2019-03-23 MED ORDER — PROPOFOL 10 MG/ML IV BOLUS
INTRAVENOUS | Status: AC
  Filled 2019-03-23: qty 40

## 2019-03-23 MED ORDER — CEFAZOLIN SODIUM-DEXTROSE 2-4 GM/100ML-% IV SOLN
2.0000 g | Freq: Once | INTRAVENOUS | Status: AC
  Administered 2019-03-23: 2 g via INTRAVENOUS
  Filled 2019-03-23: qty 100

## 2019-03-23 MED ORDER — LIDOCAINE 2% (20 MG/ML) 5 ML SYRINGE
INTRAMUSCULAR | Status: DC | PRN
  Administered 2019-03-23: 100 mg via INTRAVENOUS

## 2019-03-23 MED ORDER — KETOROLAC TROMETHAMINE 30 MG/ML IJ SOLN
INTRAMUSCULAR | Status: DC | PRN
  Administered 2019-03-23: 30 mg via INTRAVENOUS

## 2019-03-23 MED ORDER — DEXAMETHASONE SODIUM PHOSPHATE 10 MG/ML IJ SOLN
INTRAMUSCULAR | Status: DC | PRN
  Administered 2019-03-23: 4 mg via INTRAVENOUS

## 2019-03-23 MED ORDER — ARTIFICIAL TEARS OPHTHALMIC OINT
TOPICAL_OINTMENT | OPHTHALMIC | Status: AC
  Filled 2019-03-23: qty 3.5

## 2019-03-23 MED ORDER — ONDANSETRON HCL 4 MG/2ML IJ SOLN
INTRAMUSCULAR | Status: DC | PRN
  Administered 2019-03-23: 4 mg via INTRAVENOUS

## 2019-03-23 MED ORDER — HYDROCODONE-ACETAMINOPHEN 7.5-325 MG PO TABS
1.0000 | ORAL_TABLET | Freq: Once | ORAL | Status: DC | PRN
  Filled 2019-03-23: qty 1

## 2019-03-23 MED ORDER — IOHEXOL 300 MG/ML  SOLN
INTRAMUSCULAR | Status: DC | PRN
  Administered 2019-03-23: 3 mL

## 2019-03-23 MED ORDER — FENTANYL CITRATE (PF) 100 MCG/2ML IJ SOLN
INTRAMUSCULAR | Status: AC
  Filled 2019-03-23: qty 2

## 2019-03-23 MED ORDER — DEXAMETHASONE SODIUM PHOSPHATE 10 MG/ML IJ SOLN
INTRAMUSCULAR | Status: AC
  Filled 2019-03-23: qty 1

## 2019-03-23 SURGICAL SUPPLY — 22 items
BAG DRAIN URO-CYSTO SKYTR STRL (DRAIN) ×3 IMPLANT
BASKET ZERO TIP NITINOL 2.4FR (BASKET) ×3 IMPLANT
CATH INTERMIT  6FR 70CM (CATHETERS) IMPLANT
CLOTH BEACON ORANGE TIMEOUT ST (SAFETY) ×3 IMPLANT
DRSG TEGADERM 2-3/8X2-3/4 SM (GAUZE/BANDAGES/DRESSINGS) ×3 IMPLANT
FIBER LASER TRAC TIP (UROLOGICAL SUPPLIES) ×3 IMPLANT
GLOVE BIO SURGEON STRL SZ 6.5 (GLOVE) ×2 IMPLANT
GLOVE BIO SURGEON STRL SZ7.5 (GLOVE) ×3 IMPLANT
GLOVE BIO SURGEONS STRL SZ 6.5 (GLOVE) ×1
GOWN STRL REUS W/ TWL XL LVL3 (GOWN DISPOSABLE) ×2 IMPLANT
GOWN STRL REUS W/TWL XL LVL3 (GOWN DISPOSABLE) ×4
GUIDEWIRE ANG ZIPWIRE 038X150 (WIRE) IMPLANT
GUIDEWIRE STR DUAL SENSOR (WIRE) ×3 IMPLANT
KIT TURNOVER CYSTO (KITS) ×3 IMPLANT
MANIFOLD NEPTUNE II (INSTRUMENTS) ×3 IMPLANT
NS IRRIG 500ML POUR BTL (IV SOLUTION) ×3 IMPLANT
PACK CYSTO (CUSTOM PROCEDURE TRAY) ×3 IMPLANT
SHEATH URET ACCESS 12FR/35CM (UROLOGICAL SUPPLIES) ×3 IMPLANT
STENT URET 6FRX24 CONTOUR (STENTS) ×3 IMPLANT
TUBE CONNECTING 12'X1/4 (SUCTIONS) ×1
TUBE CONNECTING 12X1/4 (SUCTIONS) ×2 IMPLANT
TUBING UROLOGY SET (TUBING) ×3 IMPLANT

## 2019-03-23 NOTE — Discharge Instructions (Signed)
1. You may see some blood in the urine and may have some burning with urination for 48-72 hours. You also may notice that you have to urinate more frequently or urgently after your procedure which is normal.  2. You should call should you develop an inability urinate, fever > 101, persistent nausea and vomiting that prevents you from eating or drinking to stay hydrated.  3. If you have a stent, you will likely urinate more frequently and urgently until the stent is removed and you may experience some discomfort/pain in the lower abdomen and flank especially when urinating. You may take pain medication prescribed to you if needed for pain. You may also intermittently have blood in the urine until the stent is removed.       4.   You may remove your stent on Friday morning.  Simply pull the string that is taped to your body and the stent will easily come out.  This may be best done in the shower as some urine may come out              with the stent.  Usually you will feel relief once the stent is removed, but occasionally patients can develop pain due to residual swelling of the ureter that may temporarily obstruct the kidney.  This can              be managed by taking pain medication and it will typically resolve with time.  Please do not hesitate to call if you have pain that is not controlled with your pain medication or does not improved within              24-48 hours.       5.   You may restart Plavix in 2 days.  Continue taking your aspirin 81 mg.  [facility] [phone] Post Ureteroscopy With or Without Stent Instructions  Definitions:  Ureter: The duct that transports urine from the kidney to the bladder. Stent:   A plastic hollow tube that is placed into the ureter, from the kidney to the bladder to prevent the ureter from swelling shut.  GENERAL INSTRUCTIONS:  Despite the fact that no skin incisions were used, the area around the ureter and bladder is raw and  irritated. The stent is a foreign body which will further irritate the bladder wall. This irritation is manifested by increased frequency of urination, both day and night, and by an increase in the urge to urinate. In some, the urge to urinate is present almost always. Sometimes the urge is strong enough that you may not be able to stop yourself from urinating. The only real cure is to remove the stent and then give time for the bladder wall to heal which can't be done until the danger of the ureter swelling shut has passed, which varies.  You may see some blood in your urine while the stent is in place and a few days afterwards. Do not be alarmed, even if the urine was clear for a while. Get off your feet and drink lots of fluids until clearing occurs. If you start to pass clots or don't improve, call us.  DIET: You may return to your normal diet immediately. Because of the raw surface of your bladder, alcohol, spicy foods, acid type foods and drinks with caffeine may cause irritation or frequency and should be used in moderation. To keep your urine flowing freely and to avoid constipation, drink plenty of fluids during the  day ( 8-10 glasses ). Tip: Avoid cranberry juice because it is very acidic.  ACTIVITY: Your physical activity doesn't need to be restricted. However, if you are very active, you may see some blood in your urine. We suggest that you reduce your activity under these circumstances until the bleeding has stopped.  BOWELS: It is important to keep your bowels regular during the postoperative period. Straining with bowel movements can cause bleeding. A bowel movement every other day is reasonable. Use a mild laxative if needed, such as Milk of Magnesia 2-3 tablespoons, or 2 Dulcolax tablets. Call if you continue to have problems. If you have been taking narcotics for pain, before, during or after your surgery, you may be constipated. Take a laxative if necessary.   MEDICATION: You  should resume your pre-surgery medications unless told not to. In addition you will often be given an antibiotic to prevent infection. These should be taken as prescribed until the bottles are finished unless you are having an unusual reaction to one of the drugs.  PROBLEMS YOU SHOULD REPORT TO [place]:  Fevers over 100.5 Fahrenheit.  Heavy bleeding, or clots ( See above notes about blood in urine ).  Inability to urinate.  Drug reactions ( hives, rash, nausea, vomiting, diarrhea ).  Severe burning or pain with urination that is not improving.  FOLLOW-UP: You will need a follow-up appointment to monitor your progress. Call for this appointment at the number listed above. Usually the first appointment will be about three to fourteen days after your surgery.   Post Anesthesia Home Care Instructions  Activity: Get plenty of rest for the remainder of the day. A responsible individual must stay with you for 24 hours following the procedure.  For the next 24 hours, DO NOT: -Drive a car -Advertising copywriter -Drink alcoholic beverages -Take any medication unless instructed by your physician -Make any legal decisions or sign important papers.  Meals: Start with liquid foods such as gelatin or soup. Progress to regular foods as tolerated. Avoid greasy, spicy, heavy foods. If nausea and/or vomiting occur, drink only clear liquids until the nausea and/or vomiting subsides. Call your physician if vomiting continues.  Special Instructions/Symptoms: Your throat may feel dry or sore from the anesthesia or the breathing tube placed in your throat during surgery. If this causes discomfort, gargle with warm salt water. The discomfort should disappear within 24 hours.

## 2019-03-23 NOTE — Interval H&P Note (Signed)
History and Physical Interval Note:  03/23/2019 12:27 PM  Antonio [name]  has presented today for surgery, with the diagnosis of RIGHT URETERAL CALCULUS.  The various methods of treatment have been discussed with the patient and family. After consideration of risks, benefits and other options for treatment, the patient has consented to  Procedure(s): CYSTOSCOPY/URETEROSCOPY/HOLMIUM LASER/STENT PLACEMENT (Right) as a surgical intervention.  The patient's history has been reviewed, patient examined, no change in status, stable for surgery.  I have reviewed the patient's chart and labs.  Questions were answered to the patient's satisfaction.     [employer]

## 2019-03-23 NOTE — Anesthesia Postprocedure Evaluation (Signed)
Anesthesia Post Note  Patient: Al Decant  Procedure(s) Performed: CYSTOSCOPY/URETEROSCOPY/HOLMIUM LASER/STENT PLACEMENT (Right Renal)     Patient location during evaluation: PACU Anesthesia Type: General Level of consciousness: awake and alert Pain management: pain level controlled Vital Signs Assessment: post-procedure vital signs reviewed and stable Respiratory status: spontaneous breathing, nonlabored ventilation, respiratory function stable and patient connected to nasal cannula oxygen Cardiovascular status: blood pressure returned to baseline and stable Postop Assessment: no apparent nausea or vomiting Anesthetic complications: no    Last Vitals:  Vitals:   03/23/19 1415 03/23/19 1502  BP: (!) 171/103 (!) 171/100  Pulse: 76 71  Resp: 18 18  Temp:  36.8 C  SpO2: 95% 100%    Last Pain:  Vitals:   03/23/19 1502  TempSrc:   PainSc: 3                  Barnet [place]

## 2019-03-23 NOTE — Transfer of Care (Signed)
  Last Vitals:  Vitals Value Taken Time  BP    Temp 36.7 C 03/23/19 1335  Pulse 68 03/23/19 1338  Resp 12 03/23/19 1338  SpO2 99 % 03/23/19 1338  Vitals shown include unvalidated device data.  Last Pain:  Vitals:   03/23/19 1005  TempSrc: Oral  PainSc: 7       Patients Stated Pain Goal: 5 (03/23/19 1005) Immediate Anesthesia Transfer of Care Note  Patient: Antonio [name]  Procedure(s) Performed: Procedure(s) (LRB): CYSTOSCOPY/URETEROSCOPY/HOLMIUM LASER/STENT PLACEMENT (Right)  Patient Location: PACU  Anesthesia Type: General  Level of Consciousness: awake, alert  and oriented  Airway & Oxygen Therapy: Patient Spontanous Breathing and Patient connected to nasal cannula oxygen  Post-op Assessment: Report given to PACU RN and Post -op Vital signs reviewed and stable  Post vital signs: Reviewed and stable  Complications: No apparent anesthesia complications

## 2019-03-23 NOTE — Anesthesia Procedure Notes (Signed)
Procedure Name: LMA Insertion Date/Time: 03/23/2019 12:40 PM Performed by: Mechele Claude, CRNA Pre-anesthesia Checklist: Patient identified, Emergency Drugs available, Suction available and Patient being monitored Patient Re-evaluated:Patient Re-evaluated prior to induction Oxygen Delivery Method: Circle system utilized Preoxygenation: Pre-oxygenation with 100% oxygen Induction Type: IV induction Ventilation: Mask ventilation without difficulty LMA: LMA inserted LMA Size: 5.0 Number of attempts: 1 Airway Equipment and Method: Bite block Placement Confirmation: positive ETCO2 Tube secured with: Tape Dental Injury: Teeth and Oropharynx as per pre-operative assessment

## 2019-03-23 NOTE — Op Note (Signed)
Preoperative diagnosis: Right proximal ureteral calculus  Postoperative diagnosis: Right proximal ureteral calculus  Procedure:  1. Cystoscopy 2. Right ureteroscopy and stone removal 3. Ureteroscopic laser lithotripsy 4. Right ureteral stent placement (6 x 24 with string) 5. Right retrograde pyelography with interpretation  Surgeon: Pryor Curia. M.D.  Anesthesia: General  Complications: None  Intraoperative findings: Right retrograde pyelography demonstrated a filling defect within the proximal right ureter consistent with the patient's known calculus without other abnormalities noted.  EBL: Minimal  Specimens: 1. Right calculus  Disposition of specimens: [facility] for stone analysis  Indication: Antonio [name]  is a 58 y.o. patient with a proximal right ureteral stone. After reviewing the management options for treatment, they elected to proceed with the above surgical procedure(s). We have discussed the potential benefits and risks of the procedure, side effects of the proposed treatment, the likelihood of the patient achieving the goals of the procedure, and any potential problems that might occur during the procedure or recuperation. Informed consent has been obtained.  Description of procedure:  The patient was taken to the operating room and general anesthesia was induced.  The patient was placed in the dorsal lithotomy position, prepped and draped in the usual sterile fashion, and preoperative antibiotics were administered. A preoperative time-out was performed.   Cystourethroscopy was performed.  The patient's urethra was examined and was normal. The bladder was then systematically examined in its entirety. There was no evidence for any bladder tumors, stones, or other mucosal pathology.    Attention then turned to the right ureteral orifice and a ureteral catheter was used to intubate the ureteral orifice.  Omnipaque contrast was injected  through the ureteral catheter and a retrograde pyelogram was performed with findings as dictated above.  A 0.38 sensor guidewire was then advanced up the right ureter into the renal pelvis under fluoroscopic guidance.  A 12/14 Fr ureteral access sheath was then advanced over the guide wire. The digital flexible ureteroscope was then advanced through the access sheath into the ureter next to the guidewire and the calculus was identified and was located in the proximal right ureter.   The stone was then fragmented with the 200 micron holmium laser fiber on a setting of 0.6 J and frequency of 6 Hz.   All sizable stones were then removed with a zero tip nitinol basket.  Reinspection of the ureter/renal pelvis revealed no remaining visible stones or fragments of significant size.   The safety wire was then replaced and the access sheath removed.  The guidewire was backloaded through the cystoscope and a ureteral stent was advance over the wire using Seldinger technique.  The stent was positioned appropriately under fluoroscopic and cystoscopic guidance.  The wire was then removed with an adequate stent curl noted in the renal pelvis as well as in the bladder.  The bladder was then emptied and the procedure ended.  The patient appeared to tolerate the procedure well and without complications.  The patient was able to be awakened and transferred to the recovery unit in satisfactory condition.   Pryor Curia MD

## 2020-03-17 ENCOUNTER — Inpatient Hospital Stay (HOSPITAL_BASED_OUTPATIENT_CLINIC_OR_DEPARTMENT_OTHER)
Admission: EM | Admit: 2020-03-17 | Discharge: 2020-03-20 | DRG: 378 | Disposition: A | Discharge status: Home / Self Care | Payer: No Typology Code available for payment source | Attending: Internal Medicine | Admitting: Internal Medicine

## 2020-03-17 ENCOUNTER — Encounter (HOSPITAL_BASED_OUTPATIENT_CLINIC_OR_DEPARTMENT_OTHER): Payer: Self-pay

## 2020-03-17 ENCOUNTER — Other Ambulatory Visit: Payer: Self-pay

## 2020-03-17 ENCOUNTER — Emergency Department (HOSPITAL_BASED_OUTPATIENT_CLINIC_OR_DEPARTMENT_OTHER): Payer: No Typology Code available for payment source

## 2020-03-17 DIAGNOSIS — E1169 Type 2 diabetes mellitus with other specified complication: Secondary | ICD-10-CM

## 2020-03-17 DIAGNOSIS — K625 Hemorrhage of anus and rectum: Secondary | ICD-10-CM | POA: Diagnosis present

## 2020-03-17 DIAGNOSIS — Z20822 Contact with and (suspected) exposure to covid-19: Secondary | ICD-10-CM | POA: Diagnosis present

## 2020-03-17 DIAGNOSIS — K5731 Diverticulosis of large intestine without perforation or abscess with bleeding: Principal | ICD-10-CM | POA: Diagnosis present

## 2020-03-17 DIAGNOSIS — Z9049 Acquired absence of other specified parts of digestive tract: Secondary | ICD-10-CM

## 2020-03-17 DIAGNOSIS — D62 Acute posthemorrhagic anemia: Secondary | ICD-10-CM | POA: Diagnosis present

## 2020-03-17 DIAGNOSIS — Z79899 Other long term (current) drug therapy: Secondary | ICD-10-CM

## 2020-03-17 DIAGNOSIS — I2510 Atherosclerotic heart disease of native coronary artery without angina pectoris: Secondary | ICD-10-CM | POA: Diagnosis present

## 2020-03-17 DIAGNOSIS — F4310 Post-traumatic stress disorder, unspecified: Secondary | ICD-10-CM | POA: Diagnosis present

## 2020-03-17 DIAGNOSIS — Z7984 Long term (current) use of oral hypoglycemic drugs: Secondary | ICD-10-CM

## 2020-03-17 DIAGNOSIS — Z7982 Long term (current) use of aspirin: Secondary | ICD-10-CM

## 2020-03-17 DIAGNOSIS — K219 Gastro-esophageal reflux disease without esophagitis: Secondary | ICD-10-CM | POA: Diagnosis present

## 2020-03-17 DIAGNOSIS — Z87442 Personal history of urinary calculi: Secondary | ICD-10-CM

## 2020-03-17 DIAGNOSIS — Z8249 Family history of ischemic heart disease and other diseases of the circulatory system: Secondary | ICD-10-CM

## 2020-03-17 DIAGNOSIS — Z955 Presence of coronary angioplasty implant and graft: Secondary | ICD-10-CM

## 2020-03-17 DIAGNOSIS — E119 Type 2 diabetes mellitus without complications: Secondary | ICD-10-CM

## 2020-03-17 DIAGNOSIS — E782 Mixed hyperlipidemia: Secondary | ICD-10-CM | POA: Diagnosis present

## 2020-03-17 DIAGNOSIS — Z820 Family history of epilepsy and other diseases of the nervous system: Secondary | ICD-10-CM

## 2020-03-17 DIAGNOSIS — Z87891 Personal history of nicotine dependence: Secondary | ICD-10-CM

## 2020-03-17 DIAGNOSIS — I252 Old myocardial infarction: Secondary | ICD-10-CM

## 2020-03-17 DIAGNOSIS — E1151 Type 2 diabetes mellitus with diabetic peripheral angiopathy without gangrene: Secondary | ICD-10-CM | POA: Diagnosis present

## 2020-03-17 DIAGNOSIS — E876 Hypokalemia: Secondary | ICD-10-CM | POA: Diagnosis present

## 2020-03-17 DIAGNOSIS — K922 Gastrointestinal hemorrhage, unspecified: Secondary | ICD-10-CM

## 2020-03-17 DIAGNOSIS — I10 Essential (primary) hypertension: Secondary | ICD-10-CM | POA: Diagnosis present

## 2020-03-17 LAB — COMPREHENSIVE METABOLIC PANEL
ALT: 16 U/L (ref 0–44)
AST: 16 U/L (ref 15–41)
Albumin: 3.7 g/dL (ref 3.5–5.0)
Alkaline Phosphatase: 67 U/L (ref 38–126)
Anion gap: 9 (ref 5–15)
BUN: 17 mg/dL (ref 6–20)
CO2: 26 mmol/L (ref 22–32)
Calcium: 9 mg/dL (ref 8.9–10.3)
Chloride: 104 mmol/L (ref 98–111)
Creatinine, Ser: 1.02 mg/dL (ref 0.61–1.24)
GFR, Estimated: >60 mL/min (ref >60)
Glucose, Bld: 151 mg/dL — ABNORMAL HIGH (ref 70–99)
Potassium: 3.5 mmol/L (ref 3.5–5.1)
Sodium: 139 mmol/L (ref 135–145)
Total Bilirubin: 0.5 mg/dL (ref 0.3–1.2)
Total Protein: 7 g/dL (ref 6.5–8.1)

## 2020-03-17 LAB — RESP PANEL BY RT-PCR (FLU A&B, COVID) ARPGX2
Influenza A by PCR: NEGATIVE
Influenza B by PCR: NEGATIVE
SARS Coronavirus 2 by RT PCR: NEGATIVE

## 2020-03-17 LAB — CBC
HCT: 35.3 % — ABNORMAL LOW (ref 39.0–52.0)
Hemoglobin: 11.7 g/dL — ABNORMAL LOW (ref 13.0–17.0)
MCH: 28.7 pg (ref 26.0–34.0)
MCHC: 33.1 g/dL (ref 30.0–36.0)
MCV: 86.5 fL (ref 80.0–100.0)
Platelets: 233 10*3/uL (ref 150–400)
RBC: 4.08 MIL/uL — ABNORMAL LOW (ref 4.22–5.81)
RDW: 14.6 % (ref 11.5–15.5)
WBC: 7 10*3/uL (ref 4.0–10.5)
nRBC: 0 % (ref 0.0–0.2)

## 2020-03-17 LAB — PROTIME-INR
INR: 1.1 (ref 0.8–1.2)
Prothrombin Time: 13.3 seconds (ref 11.4–15.2)

## 2020-03-17 LAB — OCCULT BLOOD X 1 CARD TO LAB, STOOL: Fecal Occult Bld: POSITIVE — AB

## 2020-03-17 MED ORDER — IOHEXOL 350 MG/ML SOLN
100.0000 mL | Freq: Once | INTRAVENOUS | Status: AC | PRN
  Administered 2020-03-17: 22:00:00 100 mL via INTRAVENOUS

## 2020-03-17 NOTE — ED Notes (Signed)
Tolerated orthostatics very well, gait remained very steady, no c/o dizziness or light headiness.

## 2020-03-17 NOTE — ED Notes (Signed)
Up to bathroom, gait very steady, pt had a mod size liquid stool, bright red in color, very foul smelling.

## 2020-03-17 NOTE — ED Notes (Signed)
ED Provider at bedside. 

## 2020-03-17 NOTE — ED Notes (Signed)
Hemoccult Card obtained by EDP, and to the lab

## 2020-03-17 NOTE — ED Notes (Signed)
COVID SWAB OBTAINED AND TO THE LAB 

## 2020-03-17 NOTE — ED Notes (Signed)
Patient transported to CT 

## 2020-03-17 NOTE — ED Provider Notes (Addendum)
[facility] EMERGENCY DEPARTMENT Provider Note   CSN: [id number] Arrival date & time: 03/17/20  1954     History Chief Complaint  Patient presents with  . Rectal Bleeding    Antonio [name] is a 59 y.o. male.  Past medical history CAD, diabetes, GERD, hypertension, hyperlipidemia, diverticular bleed in 2018 presents to ER with concern for GI bleed.  Symptoms started this evening, multiple episodes of bright red blood in stool.  No abdominal pain, no nausea or vomiting.  HPI     Past Medical History:  Diagnosis Date  . Coronary artery disease   . Diverticulosis   . GERD (gastroesophageal reflux disease)   . Heart attack (HCC) 2011; 2017  . Heart murmur   . Hemorrhoids   . High cholesterol   . History of blood transfusion 2011; 2017; 05/2016   "elated to diverticulitis"  . History of kidney stones   . Hyperlipidemia due to type 2 diabetes mellitus (HCC)   . Hypertension   . Lower GI bleed 2011; 2017; 05/2016  . PTSD (post-traumatic stress disorder)    PTSD  . Type II diabetes mellitus [facility])     Patient Active Problem List   Diagnosis Date Noted  . History of lower GI bleeding 07/20/2016  . Atypical chest pain 07/11/2016  . Vasovagal near syncope   . History of non-ST elevation myocardial infarction (NSTEMI) 12/22/2015  . Essential hypertension   . Hyperlipidemia due to type 2 diabetes mellitus (HCC)   . DM type 2 (diabetes mellitus, type 2) (HCC) 10/19/2012  . Diverticulosis 10/16/2012  . Coronary artery disease involving native coronary artery of native heart without angina pectoris 10/16/2012    Past Surgical History:  Procedure Laterality Date  . CARDIAC CATHETERIZATION N/A 12/23/2015   Procedure: Left Heart Cath and Coronary Angiography;  Surgeon: Peter M Swaziland, MD;  Location: [facility] INVASIVE CV LAB;  Service: Cardiovascular;  Laterality: N/A;  . CARDIAC CATHETERIZATION N/A 12/23/2015   Procedure: Coronary Stent Intervention;  Surgeon: Peter M  Swaziland, MD;  Location: [facility] INVASIVE CV LAB;  Service: Cardiovascular;  Laterality: N/A;  . COLON RESECTION  2011   12 inches taken out in 2011 at [facility] for diverticulosis  . COLON SURGERY    . COLONOSCOPY N/A 10/18/2012   Procedure: COLONOSCOPY;  Surgeon: Louis Meckel, MD;  Location: [facility] ENDOSCOPY;  Service: Endoscopy;  Laterality: N/A;  . COLONOSCOPY N/A 10/22/2012   Procedure: COLONOSCOPY;  Surgeon: Iva Boop, MD;  Location: [facility] ENDOSCOPY;  Service: Endoscopy;  Laterality: N/A;  . CORONARY ANGIOPLASTY WITH STENT PLACEMENT  2011   at [facility], 2 stents, locations unknown  . CYSTOSCOPY/URETEROSCOPY/HOLMIUM LASER/STENT PLACEMENT Right 03/23/2019   Procedure: CYSTOSCOPY/URETEROSCOPY/HOLMIUM LASER/STENT PLACEMENT;  Surgeon: Heloise Purpura, MD;  Location: [facility];  Service: Urology;  Laterality: Right;  . ESOPHAGOGASTRODUODENOSCOPY N/A 10/18/2012   Procedure: ESOPHAGOGASTRODUODENOSCOPY (EGD);  Surgeon: Louis Meckel, MD;  Location: [facility] ENDOSCOPY;  Service: Endoscopy;  Laterality: N/A;  . INGUINAL HERNIA REPAIR Right   . IRRIGATION AND DEBRIDEMENT ABSCESS N/A 10/26/2012   Procedure: IRRIGATION AND DEBRIDEMENT PERINEAL ABSCESS;  Surgeon: Wilmon Arms. Corliss Skains, MD;  Location: [facility] OR;  Service: General;  Laterality: N/A;       Family History  Problem Relation Age of Onset  . Alzheimer's disease Mother 63  . Heart attack Father 52  . CAD Brother 40       triple bypass    Social History   Tobacco Use  .  Smoking status: Former Smoker    Packs/day: 0.50    Years: 20.00    Pack years: 10.00    Types: Cigarettes    Quit date: 10/19/2002    Years since quitting: 17.4  . Smokeless tobacco: Never Used  Vaping Use  . Vaping Use: Never used  Substance Use Topics  . Alcohol use: Yes    Comment: occ  . Drug use: Yes    Types: Marijuana    Home Medications Prior to Admission medications   Medication Sig Start Date End Date Taking? Authorizing  Provider  amLODipine (NORVASC) 10 MG tablet Take 10 mg by mouth daily.   Yes [provider]  atorvastatin (LIPITOR) 80 MG tablet Take 80 mg by mouth daily.   Yes [provider]  Cholecalciferol (VITAMIN D) 50 MCG (2000 UT) CAPS Take 2,000 Units by mouth daily.   Yes [provider]  lisinopril (ZESTRIL) 40 MG tablet Take 40 mg by mouth daily.   Yes [provider]  metFORMIN (GLUCOPHAGE) 500 MG tablet Take 1,000 mg by mouth 2 (two) times daily with a meal.   Yes [provider]  metoprolol (TOPROL-XL) 200 MG 24 hr tablet Take 200 mg by mouth daily.   Yes [provider]  pantoprazole (PROTONIX) 40 MG tablet Take 40 mg by mouth daily. For heartburn   Yes [provider]  rosuvastatin (CRESTOR) 40 MG tablet Take 40 mg by mouth daily.   Yes [provider]  Semaglutide,0.25 or 0.5MG /DOS, (OZEMPIC, 0.25 OR 0.5 MG/DOSE,) 2 MG/1.5ML SOPN Inject 0.5 mg into the skin every Monday.   Yes [provider]  vitamin B-12 (CYANOCOBALAMIN) 1000 MCG tablet Take 1,000 mcg by mouth daily.   Yes [provider]  aspirin EC 81 MG tablet Take 1 tablet (81 mg total) by mouth daily. 12/24/15   Little Ishikawa, NP  clopidogrel (PLAVIX) 75 MG tablet Take 75 mg by mouth daily.    [provider]  empagliflozin (JARDIANCE) 25 MG TABS tablet Take 12.5 mg by mouth daily.    [provider]  HYDROcodone-acetaminophen (NORCO/VICODIN) 5-325 MG tablet Take 1-2 tablets by mouth every 6 (six) hours as needed. 03/23/19   Heloise Purpura, MD  nitroGLYCERIN (NITROSTAT) 0.4 MG SL tablet Place 0.4 mg under the tongue every 5 (five) minutes as needed for chest pain.    [provider]  ondansetron (ZOFRAN ODT) 8 MG disintegrating tablet Take 1 tablet (8 mg total) by mouth every 8 (eight) hours as needed for nausea or vomiting. 03/05/19   Molpus, John, MD  tamsulosin (FLOMAX) 0.4 MG CAPS capsule Take 1 capsule daily. 03/05/19    Molpus, Jonny Ruiz, MD    Allergies    Patient has no known allergies.  Review of Systems   Review of Systems  Constitutional: Negative for chills and fever.  HENT: Negative for ear pain and sore throat.   Eyes: Negative for pain and visual disturbance.  Respiratory: Negative for cough and shortness of breath.   Cardiovascular: Negative for chest pain and palpitations.  Gastrointestinal: Positive for blood in stool. Negative for abdominal pain and vomiting.  Genitourinary: Negative for dysuria and hematuria.  Musculoskeletal: Negative for arthralgias and back pain.  Skin: Negative for color change and rash.  Neurological: Negative for seizures and syncope.  All other systems reviewed and are negative.   Physical Exam Updated Vital Signs BP (!) 185/110 (BP Location: Left Arm)   Pulse 88   Temp 98.4 F (36.9 C) (  Oral)   Resp 17   Ht 5\' 7"  (1.702 m)   Wt 88.5 kg   SpO2 98%   BMI 30.54 kg/m   Physical Exam Vitals and nursing note reviewed. Exam conducted with a chaperone present.  Constitutional:      Appearance: He is well-developed and well-nourished.  HENT:     Head: Normocephalic and atraumatic.  Eyes:     Conjunctiva/sclera: Conjunctivae normal.  Cardiovascular:     Rate and Rhythm: Normal rate and regular rhythm.     Heart sounds: No murmur heard.   Pulmonary:     Effort: Pulmonary effort is normal. No respiratory distress.     Breath sounds: Normal breath sounds.  Abdominal:     Palpations: Abdomen is soft.     Tenderness: There is no abdominal tenderness.  Genitourinary:    Comments: Michael RN chaperone  Normal-appearing rectum, no hemorrhoids or fissures  Stool dark red Musculoskeletal:        General: No edema.     Cervical back: Neck supple.  Skin:    General: Skin is warm and dry.  Neurological:     Mental Status: He is alert.  Psychiatric:        Mood and Affect: Mood and affect normal.     ED Results / Procedures / Treatments   Labs (all  labs ordered are listed, but only abnormal results are displayed) Labs Reviewed  COMPREHENSIVE METABOLIC PANEL - Abnormal; Notable for the following components:      Result Value   Glucose, Bld 151 (*)    All other components within normal limits  CBC - Abnormal; Notable for the following components:   RBC 4.08 (*)    Hemoglobin 11.7 (*)    HCT 35.3 (*)    All other components within normal limits  RESP PANEL BY RT-PCR (FLU A&B, COVID) ARPGX2  PROTIME-INR  POC OCCULT BLOOD, ED    EKG None  Radiology CT Angio Abd/Pel W and/or Wo Contrast  Result Date: 03/17/2020 CLINICAL DATA:  Gastrointestinal hemorrhage, hematochezia EXAM: CTA ABDOMEN AND PELVIS WITHOUT AND WITH CONTRAST TECHNIQUE: Multidetector CT imaging of the abdomen and pelvis was performed using the standard protocol during bolus administration of intravenous contrast. Multiplanar reconstructed images and MIPs were obtained and reviewed to evaluate the vascular anatomy. CONTRAST:  03/19/2020 OMNIPAQUE IOHEXOL 350 MG/ML SOLN COMPARISON:  03/05/2019, CTA 06/17/2016 FINDINGS: VASCULAR Aorta: Moderate mixed atherosclerotic plaque. No hemodynamically significant stenosis, aneurysm, or dissection. Celiac: Less than 50% stenosis at its origin. Classic anatomic configuration. Peripherally widely patent. SMA: Widely patent.  No peripheral aneurysm or fistula identified. Renals: Single right and dual left renal arteries. Right renal artery is widely patent. Superior left renal artery is widely patent. Inferior left renal artery demonstrates a roughly 50% stenosis at its origin. Renal arterial vasculature demonstrates normal contour. No peripheral aneurysm. IMA: Less than 50% stenosis at its origin. Inflow: Moderate mixed atherosclerotic plaque results in a less than 50% stenosis of the right common iliac artery proximally. No evidence of hemodynamically significant stenosis. Right internal iliac artery demonstrates near occlusion at its origin.  Greater than 50% stenosis of the left internal iliac artery at its origin. Proximal Outflow: 50% stenosis of the right profundus femoral artery at its origin. Less than 50% stenosis of the right SMA at its origin. Less than 50% stenosis of the left SMA at its origin. 60% stenosis of the left profundus femoral artery at its origin. Veins: Circumaortic left renal vein.  Otherwise unremarkable.  Review of the MIP images confirms the above findings. NON-VASCULAR Lower chest: Visualized lung bases are clear. Right coronary artery stenting has been performed. Moderate left ventricular hypertrophy and mild left ventricular dilation. Global cardiac size is within normal limits. No pericardial effusion peer Hepatobiliary: No focal liver abnormality is seen. No gallstones, gallbladder wall thickening, or biliary dilatation. Pancreas: Unremarkable Spleen: Unremarkable Adrenals/Urinary Tract: 23 mm right adrenal adenoma is stable from multiple prior examinations. Left adrenal gland is unremarkable. Kidneys are unremarkable. Bladder is unremarkable. Stomach/Bowel: Stomach and small bowel are unremarkable. Sigmoid colectomy has been performed. There is moderate pancolonic diverticulosis involving the residual colon. The large bowel is otherwise unremarkable. No evidence of obstruction or focal inflammation. No evidence of active extravasation identified on this examination. Appendix normal. No free intraperitoneal gas or fluid. Lymphatic: No pathologic adenopathy within the abdomen and pelvis. Reproductive: Prostate is unremarkable. Other: Tiny fat containing umbilical hernia.  Rectum unremarkable. Musculoskeletal: No acute bone abnormality. Osseous structures are age-appropriate. IMPRESSION: VASCULAR No active extravasation identified. No culprit lesion identified for the patient's reported gastrointestinal hemorrhage. Peripheral vascular disease with hemodynamically significant stenoses of the inferior left renal artery and  profundus femoral artery origins bilaterally. NON-VASCULAR 23 mm right adrenal adenoma. Moderate left ventricular hypertrophy and mild left ventricular dilation. Moderate pancolonic diverticulosis.  Status post sigmoid colectomy. Aortic Atherosclerosis (ICD10-I70.0). Electronically Signed   By: Helyn Numbers MD   On: 03/17/2020 22:16    Procedures Procedures (including critical care time)  Medications Ordered in ED Medications  iohexol (OMNIPAQUE) 350 MG/ML injection 100 mL (100 mLs Intravenous Contrast Given 03/17/20 2146)    ED Course  I have reviewed the triage vital signs and the nursing notes.  Pertinent labs & imaging results that were available during my care of the patient were reviewed by me and considered in my medical decision making (see chart for details).    MDM Rules/Calculators/A&P                         59 year old male presents to ER with concern for bloody stools.  Here patient well-appearing with stable vital signs.  Stool dark red.  Basic labs were grossly within normal limits, mild drop in hemoglobin.  CTA abdomen pelvis negative for source of bleeding.  Based on symptomatology, past history, suspect he has diverticular bleed or some other lower GI bleed.  He had a couple more episodes of bloody stool in ER.  Given age, ongoing episodes, drop in hemoglobin, believe patient would benefit from admission for further management.  Will consult to hospitalist service for admit.  Final Clinical Impression(s) / ED Diagnoses Final diagnoses:  Acute GI bleeding    Rx / DC Orders ED Discharge Orders    None       Milagros Loll, MD 03/17/20 2303    Milagros Loll, MD 03/17/20 2344

## 2020-03-17 NOTE — ED Triage Notes (Addendum)
[facility] report pt with bloody stools x 2 today ~530pm-pt with hx diverticulitis-pt agrees with report-pt with 3rd episode bloody stool just prior to triage-NAD-steady gait

## 2020-03-17 NOTE — ED Notes (Signed)
Called [facility] to Re-page the Hospitalist spoke to [place]

## 2020-03-17 NOTE — ED Notes (Signed)
Lying on stretcher, wife in room, pt states he has had bright red blood in his stools. On cont cardiac monitoring with int NBP assessment and cont POX.

## 2020-03-18 ENCOUNTER — Encounter (HOSPITAL_COMMUNITY): Payer: Self-pay | Admitting: Family Medicine

## 2020-03-18 DIAGNOSIS — Z7982 Long term (current) use of aspirin: Secondary | ICD-10-CM | POA: Diagnosis not present

## 2020-03-18 DIAGNOSIS — E782 Mixed hyperlipidemia: Secondary | ICD-10-CM

## 2020-03-18 DIAGNOSIS — E1151 Type 2 diabetes mellitus with diabetic peripheral angiopathy without gangrene: Secondary | ICD-10-CM | POA: Diagnosis present

## 2020-03-18 DIAGNOSIS — F4310 Post-traumatic stress disorder, unspecified: Secondary | ICD-10-CM | POA: Diagnosis present

## 2020-03-18 DIAGNOSIS — Z955 Presence of coronary angioplasty implant and graft: Secondary | ICD-10-CM | POA: Diagnosis not present

## 2020-03-18 DIAGNOSIS — K219 Gastro-esophageal reflux disease without esophagitis: Secondary | ICD-10-CM | POA: Diagnosis present

## 2020-03-18 DIAGNOSIS — I252 Old myocardial infarction: Secondary | ICD-10-CM | POA: Diagnosis not present

## 2020-03-18 DIAGNOSIS — D62 Acute posthemorrhagic anemia: Secondary | ICD-10-CM

## 2020-03-18 DIAGNOSIS — I10 Essential (primary) hypertension: Secondary | ICD-10-CM

## 2020-03-18 DIAGNOSIS — I2510 Atherosclerotic heart disease of native coronary artery without angina pectoris: Secondary | ICD-10-CM | POA: Diagnosis not present

## 2020-03-18 DIAGNOSIS — Z9049 Acquired absence of other specified parts of digestive tract: Secondary | ICD-10-CM | POA: Diagnosis not present

## 2020-03-18 DIAGNOSIS — Z7984 Long term (current) use of oral hypoglycemic drugs: Secondary | ICD-10-CM | POA: Diagnosis not present

## 2020-03-18 DIAGNOSIS — Z8249 Family history of ischemic heart disease and other diseases of the circulatory system: Secondary | ICD-10-CM | POA: Diagnosis not present

## 2020-03-18 DIAGNOSIS — E1169 Type 2 diabetes mellitus with other specified complication: Secondary | ICD-10-CM | POA: Diagnosis present

## 2020-03-18 DIAGNOSIS — E119 Type 2 diabetes mellitus without complications: Secondary | ICD-10-CM

## 2020-03-18 DIAGNOSIS — Z20822 Contact with and (suspected) exposure to covid-19: Secondary | ICD-10-CM | POA: Diagnosis present

## 2020-03-18 DIAGNOSIS — Z79899 Other long term (current) drug therapy: Secondary | ICD-10-CM | POA: Diagnosis not present

## 2020-03-18 DIAGNOSIS — E876 Hypokalemia: Secondary | ICD-10-CM | POA: Diagnosis present

## 2020-03-18 DIAGNOSIS — K625 Hemorrhage of anus and rectum: Secondary | ICD-10-CM | POA: Diagnosis present

## 2020-03-18 DIAGNOSIS — Z820 Family history of epilepsy and other diseases of the nervous system: Secondary | ICD-10-CM | POA: Diagnosis not present

## 2020-03-18 DIAGNOSIS — Z87891 Personal history of nicotine dependence: Secondary | ICD-10-CM | POA: Diagnosis not present

## 2020-03-18 DIAGNOSIS — K5731 Diverticulosis of large intestine without perforation or abscess with bleeding: Secondary | ICD-10-CM | POA: Diagnosis present

## 2020-03-18 DIAGNOSIS — Z87442 Personal history of urinary calculi: Secondary | ICD-10-CM | POA: Diagnosis not present

## 2020-03-18 LAB — CBC
HCT: 34 % — ABNORMAL LOW (ref 39.0–52.0)
HCT: 35.2 % — ABNORMAL LOW (ref 39.0–52.0)
HCT: 37.6 % — ABNORMAL LOW (ref 39.0–52.0)
HCT: 36.2 % — ABNORMAL LOW (ref 39.0–52.0)
Hemoglobin: 11.3 g/dL — ABNORMAL LOW (ref 13.0–17.0)
Hemoglobin: 11.5 g/dL — ABNORMAL LOW (ref 13.0–17.0)
Hemoglobin: 12 g/dL — ABNORMAL LOW (ref 13.0–17.0)
Hemoglobin: 11.9 g/dL — ABNORMAL LOW (ref 13.0–17.0)
MCH: 28.8 pg (ref 26.0–34.0)
MCH: 28.5 pg (ref 26.0–34.0)
MCH: 27.8 pg (ref 26.0–34.0)
MCH: 28.5 pg (ref 26.0–34.0)
MCHC: 33.2 g/dL (ref 30.0–36.0)
MCHC: 32.7 g/dL (ref 30.0–36.0)
MCHC: 31.9 g/dL (ref 30.0–36.0)
MCHC: 32.9 g/dL (ref 30.0–36.0)
MCV: 86.5 fL (ref 80.0–100.0)
MCV: 87.3 fL (ref 80.0–100.0)
MCV: 87.2 fL (ref 80.0–100.0)
MCV: 86.8 fL (ref 80.0–100.0)
Platelets: 235 10*3/uL (ref 150–400)
Platelets: 217 10*3/uL (ref 150–400)
Platelets: 249 10*3/uL (ref 150–400)
Platelets: 244 10*3/uL (ref 150–400)
RBC: 3.93 MIL/uL — ABNORMAL LOW (ref 4.22–5.81)
RBC: 4.03 MIL/uL — ABNORMAL LOW (ref 4.22–5.81)
RBC: 4.31 MIL/uL (ref 4.22–5.81)
RBC: 4.17 MIL/uL — ABNORMAL LOW (ref 4.22–5.81)
RDW: 14.7 % (ref 11.5–15.5)
RDW: 14.8 % (ref 11.5–15.5)
RDW: 14.6 % (ref 11.5–15.5)
RDW: 14.7 % (ref 11.5–15.5)
WBC: 7.5 10*3/uL (ref 4.0–10.5)
WBC: 7.1 10*3/uL (ref 4.0–10.5)
WBC: 9.1 10*3/uL (ref 4.0–10.5)
WBC: 8.5 10*3/uL (ref 4.0–10.5)
nRBC: 0 % (ref 0.0–0.2)
nRBC: 0 % (ref 0.0–0.2)
nRBC: 0 % (ref 0.0–0.2)
nRBC: 0 % (ref 0.0–0.2)

## 2020-03-18 LAB — GLUCOSE, CAPILLARY
Glucose-Capillary: 112 mg/dL — ABNORMAL HIGH (ref 70–99)
Glucose-Capillary: 118 mg/dL — ABNORMAL HIGH (ref 70–99)
Glucose-Capillary: 131 mg/dL — ABNORMAL HIGH (ref 70–99)
Glucose-Capillary: 132 mg/dL — ABNORMAL HIGH (ref 70–99)

## 2020-03-18 LAB — COMPREHENSIVE METABOLIC PANEL
ALT: 14 U/L (ref 0–44)
AST: 17 U/L (ref 15–41)
Albumin: 3.8 g/dL (ref 3.5–5.0)
Alkaline Phosphatase: 67 U/L (ref 38–126)
Anion gap: 8 (ref 5–15)
BUN: 13 mg/dL (ref 6–20)
CO2: 24 mmol/L (ref 22–32)
Calcium: 8.5 mg/dL — ABNORMAL LOW (ref 8.9–10.3)
Chloride: 105 mmol/L (ref 98–111)
Creatinine, Ser: 0.91 mg/dL (ref 0.61–1.24)
GFR, Estimated: >60 mL/min (ref >60)
Glucose, Bld: 137 mg/dL — ABNORMAL HIGH (ref 70–99)
Potassium: 3.4 mmol/L — ABNORMAL LOW (ref 3.5–5.1)
Sodium: 137 mmol/L (ref 135–145)
Total Bilirubin: 0.5 mg/dL (ref 0.3–1.2)
Total Protein: 6.7 g/dL (ref 6.5–8.1)

## 2020-03-18 LAB — HEMOGLOBIN A1C
Hgb A1c MFr Bld: 6.6 % — ABNORMAL HIGH (ref 4.8–5.6)
Mean Plasma Glucose: 142.72 mg/dL

## 2020-03-18 LAB — HIV ANTIBODY (ROUTINE TESTING W REFLEX): HIV Screen 4th Generation wRfx: NONREACTIVE

## 2020-03-18 MED ORDER — LISINOPRIL 20 MG PO TABS
40.0000 mg | ORAL_TABLET | Freq: Every day | ORAL | Status: DC
  Administered 2020-03-18 – 2020-03-20 (×3): 40 mg via ORAL
  Filled 2020-03-18 (×3): qty 2

## 2020-03-18 MED ORDER — METOPROLOL SUCCINATE ER 100 MG PO TB24
200.0000 mg | ORAL_TABLET | Freq: Every day | ORAL | Status: DC
  Administered 2020-03-18 – 2020-03-20 (×3): 200 mg via ORAL
  Filled 2020-03-18 (×3): qty 2

## 2020-03-18 MED ORDER — POTASSIUM CHLORIDE 10 MEQ/100ML IV SOLN
10.0000 meq | INTRAVENOUS | Status: AC
  Administered 2020-03-18 (×4): 10 meq via INTRAVENOUS
  Filled 2020-03-18 (×4): qty 100

## 2020-03-18 MED ORDER — DIPHENHYDRAMINE HCL 50 MG/ML IJ SOLN
25.0000 mg | Freq: Once | INTRAMUSCULAR | Status: AC
  Administered 2020-03-18: 13:00:00 25 mg via INTRAVENOUS
  Filled 2020-03-18: qty 1

## 2020-03-18 MED ORDER — AMLODIPINE BESYLATE 10 MG PO TABS
10.0000 mg | ORAL_TABLET | Freq: Every day | ORAL | Status: DC
  Administered 2020-03-18 – 2020-03-20 (×3): 10 mg via ORAL
  Filled 2020-03-18 (×3): qty 1

## 2020-03-18 MED ORDER — ACETAMINOPHEN 650 MG RE SUPP: 650.0000 mg | Freq: Four times a day (QID) | RECTAL | Status: DC | PRN

## 2020-03-18 MED ORDER — ROSUVASTATIN CALCIUM 20 MG PO TABS
40.0000 mg | ORAL_TABLET | Freq: Every day | ORAL | Status: DC
  Administered 2020-03-18 – 2020-03-20 (×3): 40 mg via ORAL
  Filled 2020-03-18 (×3): qty 2

## 2020-03-18 MED ORDER — PANTOPRAZOLE SODIUM 40 MG IV SOLR
40.0000 mg | Freq: Two times a day (BID) | INTRAVENOUS | Status: DC
  Administered 2020-03-18 (×2): 40 mg via INTRAVENOUS
  Filled 2020-03-18: qty 40

## 2020-03-18 MED ORDER — ONDANSETRON HCL 4 MG PO TABS: 4.0000 mg | ORAL_TABLET | Freq: Four times a day (QID) | ORAL | Status: DC | PRN

## 2020-03-18 MED ORDER — INSULIN ASPART 100 UNIT/ML ~~LOC~~ SOLN
0.0000 [IU] | Freq: Four times a day (QID) | SUBCUTANEOUS | Status: DC
  Administered 2020-03-18 – 2020-03-20 (×7): 2 [IU] via SUBCUTANEOUS

## 2020-03-18 MED ORDER — ONDANSETRON HCL 4 MG/2ML IJ SOLN: 4.0000 mg | Freq: Four times a day (QID) | INTRAMUSCULAR | Status: DC | PRN

## 2020-03-18 MED ORDER — POLYETHYLENE GLYCOL 3350 17 G PO PACK: 17.0000 g | PACK | Freq: Every day | ORAL | Status: DC | PRN

## 2020-03-18 MED ORDER — NITROGLYCERIN 0.4 MG SL SUBL: 0.4000 mg | SUBLINGUAL_TABLET | SUBLINGUAL | Status: DC | PRN

## 2020-03-18 MED ORDER — ACETAMINOPHEN 325 MG PO TABS
650.0000 mg | ORAL_TABLET | Freq: Four times a day (QID) | ORAL | Status: DC | PRN
  Administered 2020-03-19: 650 mg via ORAL
  Filled 2020-03-18: qty 2

## 2020-03-18 MED ORDER — LACTATED RINGERS IV SOLN: INTRAVENOUS | Status: DC

## 2020-03-18 NOTE — Progress Notes (Signed)
  PROGRESS NOTE  Patient admitted earlier this morning. See H&P.   Antonio [name] is a 59 year old male with past medical history significant for coronary artery disease (S/P NSTEMI with DES x 2 to RCA), hypertension, diabetes mellitus type 2, gastroesophageal reflux disease, hyperlipidemia and prior lower GI bleed in the past (2018) presented with chief complaint of bright red blood per rectum. [facility], patient exhibited 3 more episodes of bright red blood per rectum.  Patient was noted to be hemodynamically stable.  Patient was noted to have a hemoglobin of 11.7, down from 13.7. CT angiogram of the abdomen revealed pancolonic diverticulosis but did not reveal any source of acute bleeding. Patient was admitted and GI consulted.   This morning, patient complains of mild dizziness and headache. No further bleeding since admission.   A/P:  Rectal bleeding, suspected diverticular bleed, with acute blood loss anemia -CTA A/P: No active extravasation identified. No culprit lesion identified for the patient's reported gastrointestinal hemorrhage. Moderate pancolonic diverticulosis.  Status post sigmoid colectomy. -Continue to trend hemoglobin -Hold home aspirin -GI consulted  CAD -Stable -Hold aspirin.  Continue Crestor, Toprol  Diabetes mellitus type 2, well controlled -Hemoglobin A1c 6.6 -Sliding scale insulin  Essential hypertension -Continue Norvasc, lisinopril, Toprol  GERD -PPI  Hypokalemia -Replace, trend   Status is: Inpatient  Remains inpatient appropriate because:Ongoing diagnostic testing needed not appropriate for outpatient work up and Inpatient level of care appropriate due to severity of illness   Dispo: The patient is from: Home              Anticipated d/c is to: Home              Anticipated d/c date is: 1 day              Patient currently is not medically stable to d/c.  Continue to monitor for rectal  bleeding.  GI consulted.       Noralee Stain, DO [facility] 03/18/2020, 10:55 AM  Available via Epic secure chat 7am-7pm After these hours, please refer to coverage provider listed on [email or web address]

## 2020-03-18 NOTE — H&P (Signed)
History and Physical    Antonio [name] HER:[id number] DOB: 01-10-1961 DOA: 03/17/2020  PCP: [facility]  Patient coming from: [facility]   Chief Complaint:  Chief Complaint  Patient presents with  . Rectal Bleeding     HPI:    59 year old male with past medical history of coronary artery disease (S/P NSTEMI with DES x 2 to RCA), hypertension, diabetes mellitus type 2, gastroesophageal reflux disease, hyperlipidemia and prior lower GI bleed in the past (2018) presented to [facility] long hospital as a transfer from [facility] for bright red blood per rectum.  Patient explains that yesterday evening while patient was moving his bowels he suddenly began to experience bright red blood per rectum.  Explains that he "filled up" toilet bowl with bright red blood.  Patient experienced 2 episodes of bright red blood in the toilet bowl within a short period of time.  Patient denies any associated abdominal pain, shortness of breath, chest pain or lightheadedness.  Patient denies any fevers, diarrhea nausea or vomiting as of late.   Patient does report ongoing daily aspirin use.  Patient denies regular alcohol use.  Patient denies regular NSAID use.  After the second episode patient decided to present to consider [facility] emergency department for evaluation.  [facility] department, patient exhibited 3 more episodes of bright red blood per rectum.  Patient was noted to be hemodynamically stable.  Patient was noted to have a hemoglobin of 11.7, down from 13.7 one-year ago.  CT angiogram of the abdomen revealed pancolonic diverticulosis but did not reveal any source of acute bleeding.  Due to frequent episodes of gross bleeding and history of GI bleeding in the past, hospitalist group was contacted and patient was accepted to medical floor at [facility] for further management.  Review of Systems:   Review of Systems  Gastrointestinal: Positive  for blood in stool.  All other systems reviewed and are negative.   Past Medical History:  Diagnosis Date  . Coronary artery disease   . Diverticulosis   . GERD (gastroesophageal reflux disease)   . Heart attack (HCC) 2011; 2017  . Heart murmur   . Hemorrhoids   . High cholesterol   . History of blood transfusion 2011; 2017; 05/2016   "elated to diverticulitis"  . History of kidney stones   . Hyperlipidemia due to type 2 diabetes mellitus (HCC)   . Hypertension   . Lower GI bleed 2011; 2017; 05/2016  . PTSD (post-traumatic stress disorder)    PTSD  . Type II diabetes mellitus (HCC)     Past Surgical History:  Procedure Laterality Date  . CARDIAC CATHETERIZATION N/A 12/23/2015   Procedure: Left Heart Cath and Coronary Angiography;  Surgeon: Peter M Swaziland, MD;  Location: [facility] INVASIVE CV LAB;  Service: Cardiovascular;  Laterality: N/A;  . CARDIAC CATHETERIZATION N/A 12/23/2015   Procedure: Coronary Stent Intervention;  Surgeon: Peter M Swaziland, MD;  Location: [facility] INVASIVE CV LAB;  Service: Cardiovascular;  Laterality: N/A;  . COLON RESECTION  2011   12 inches taken out in 2011 at [facility] for diverticulosis  . COLON SURGERY    . COLONOSCOPY N/A 10/18/2012   Procedure: COLONOSCOPY;  Surgeon: Louis Meckel, MD;  Location: [facility] ENDOSCOPY;  Service: Endoscopy;  Laterality: N/A;  . COLONOSCOPY N/A 10/22/2012   Procedure: COLONOSCOPY;  Surgeon: Iva Boop, MD;  Location: [facility] ENDOSCOPY;  Service: Endoscopy;  Laterality: N/A;  . CORONARY ANGIOPLASTY  WITH STENT PLACEMENT  2011   at [facility], 2 stents, locations unknown  . CYSTOSCOPY/URETEROSCOPY/HOLMIUM LASER/STENT PLACEMENT Right 03/23/2019   Procedure: CYSTOSCOPY/URETEROSCOPY/HOLMIUM LASER/STENT PLACEMENT;  Surgeon: Heloise Purpura, MD;  Location: [facility];  Service: Urology;  Laterality: Right;  . ESOPHAGOGASTRODUODENOSCOPY N/A 10/18/2012   Procedure: ESOPHAGOGASTRODUODENOSCOPY (EGD);  Surgeon: Louis Meckel, MD;  Location: [facility] ENDOSCOPY;  Service: Endoscopy;  Laterality: N/A;  . INGUINAL HERNIA REPAIR Right   . IRRIGATION AND DEBRIDEMENT ABSCESS N/A 10/26/2012   Procedure: IRRIGATION AND DEBRIDEMENT PERINEAL ABSCESS;  Surgeon: Wilmon Arms. Corliss Skains, MD;  Location: [facility] OR;  Service: General;  Laterality: N/A;     reports that he quit smoking about 17 years ago. His smoking use included cigarettes. He has a 10.00 pack-year smoking history. He has never used smokeless tobacco. He reports current alcohol use. He reports current drug use. Drug: Marijuana.  No Known Allergies  Family History  Problem Relation Age of Onset  . Alzheimer's disease Mother 37  . Heart attack Father 70  . CAD Brother 40       triple bypass     Prior to Admission medications   Medication Sig Start Date End Date Taking? Authorizing Provider  amLODipine (NORVASC) 10 MG tablet Take 10 mg by mouth daily.   Yes [provider]  aspirin EC 81 MG tablet Take 1 tablet (81 mg total) by mouth daily. 12/24/15  Yes Little Ishikawa, NP  atorvastatin (LIPITOR) 80 MG tablet Take 80 mg by mouth daily.   Yes [provider]  Cholecalciferol (VITAMIN D) 50 MCG (2000 UT) CAPS Take 2,000 Units by mouth daily.   Yes [provider]  empagliflozin (JARDIANCE) 25 MG TABS tablet Take 12.5 mg by mouth daily.   Yes [provider]  lisinopril (ZESTRIL) 40 MG tablet Take 40 mg by mouth daily.   Yes [provider]  metFORMIN (GLUCOPHAGE-XR) 500 MG 24 hr tablet Take 1,000 mg by mouth daily. 03/06/20  Yes [provider]  metoprolol (TOPROL-XL) 200 MG 24 hr tablet Take 200 mg by mouth daily.   Yes [provider]  nitroGLYCERIN (NITROSTAT) 0.4 MG SL tablet Place 0.4 mg under the tongue every 5 (five) minutes as needed for chest pain.   Yes [provider]  pantoprazole (PROTONIX) 40 MG tablet Take 40 mg by mouth daily. For heartburn   Yes [provider]  rosuvastatin  (CRESTOR) 40 MG tablet Take 40 mg by mouth daily.   Yes [provider]  Semaglutide,0.25 or 0.5MG /DOS, (OZEMPIC, 0.25 OR 0.5 MG/DOSE,) 2 MG/1.5ML SOPN Inject 0.5 mg into the skin every Monday.   Yes [provider]  vitamin B-12 (CYANOCOBALAMIN) 1000 MCG tablet Take 1,000 mcg by mouth daily.   Yes [provider]  HYDROcodone-acetaminophen (NORCO/VICODIN) 5-325 MG tablet Take 1-2 tablets by mouth every 6 (six) hours as needed. Patient not taking: No sig reported 03/23/19   Heloise Purpura, MD  ondansetron (ZOFRAN ODT) 8 MG disintegrating tablet Take 1 tablet (8 mg total) by mouth every 8 (eight) hours as needed for nausea or vomiting. Patient not taking: No sig reported 03/05/19   Molpus, John, MD  tamsulosin (FLOMAX) 0.4 MG CAPS capsule Take 1 capsule daily. Patient not taking: No sig reported 03/05/19   Paula Libra, MD    Physical Exam: Vitals:   03/18/20 0000 03/18/20 0100 03/18/20 0200 03/18/20 0311  BP: (!) 168/95 (!) 166/95 (!) 172/98 (!) 165/93  Pulse: 85 84 80  79  Resp: (!) 22 (!) Temp:   98.6 F (37 C) 98.2 F (36.8 C)  TempSrc:   Oral Oral  SpO2: 99% 99% 98% 99%  Weight:      Height:        Constitutional: Acute alert and oriented x3, no associated distress.   Skin: no rashes, no lesions, good skin turgor noted. Eyes: Pupils are equally reactive to light.  No evidence of scleral icterus or conjunctival pallor.  ENMT: Moist mucous membranes noted.  Posterior pharynx clear of any exudate or lesions.   Neck: normal, supple, no masses, no thyromegaly.  No evidence of jugular venous distension.   Respiratory: clear to auscultation bilaterally, no wheezing, no crackles. Normal respiratory effort. No accessory muscle use.  Cardiovascular: Regular rate and rhythm, no murmurs / rubs / gallops. No extremity edema. 2+ pedal pulses. No carotid bruits.  Chest:   Nontender without crepitus or deformity.   Back:   Nontender without crepitus or  deformity. Abdomen: Abdomen is soft and nontender.  No evidence of intra-abdominal masses.  Positive bowel sounds noted in all quadrants.   Musculoskeletal: No joint deformity upper and lower extremities. Good ROM, no contractures. Normal muscle tone.  Neurologic: CN 2-12 grossly intact. Sensation intact.  Patient moving all 4 extremities spontaneously.  Patient is following all commands.  Patient is responsive to verbal stimuli.   Psychiatric: Patient exhibits normal mood with appropriate affect.  Patient seems to possess insight as to their current situation.     Labs on Admission: I have personally reviewed following labs and imaging studies -   CBC: Recent Labs  Lab 03/17/20 2045 03/18/20 0156 03/18/20 0650  WBC 7.0 7.5 7.1  HGB 11.7* 11.3* 11.5*  HCT 35.3* 34.0* 35.2*  MCV 86.5 86.5 87.3  PLT 233 235 217   Basic Metabolic Panel: Recent Labs  Lab 03/17/20 2045 03/18/20 0650  NA 139 137  K 3.5 3.4*  CL 104 105  CO2 26 24  GLUCOSE 151* 137*  BUN 17 13  CREATININE 1.02 0.91  CALCIUM 9.0 8.5*   GFR: Estimated Creatinine Clearance: 91.7 mL/min (by C-G formula based on SCr of 0.91 mg/dL). Liver Function Tests: Recent Labs  Lab 03/17/20 2045 03/18/20 0650  AST 16 17  ALT 16 14  ALKPHOS 67 67  BILITOT 0.5 0.5  PROT 7.0 6.7  ALBUMIN 3.7 3.8   No results for input(s): LIPASE, AMYLASE in the last 168 hours. No results for input(s): AMMONIA in the last 168 hours. Coagulation Profile: Recent Labs  Lab 03/17/20 2142  INR 1.1   Cardiac Enzymes: No results for input(s): CKTOTAL, CKMB, CKMBINDEX, TROPONINI in the last 168 hours. BNP (last 3 results) No results for input(s): PROBNP in the last 8760 hours. HbA1C: No results for input(s): HGBA1C in the last 72 hours. CBG: No results for input(s): GLUCAP in the last 168 hours. Lipid Profile: No results for input(s): CHOL, HDL, LDLCALC, TRIG, CHOLHDL, LDLDIRECT in the last 72 hours. Thyroid Function Tests: No results  for input(s): TSH, T4TOTAL, FREET4, T3FREE, THYROIDAB in the last 72 hours. Anemia Panel: No results for input(s): VITAMINB12, FOLATE, FERRITIN, TIBC, IRON, RETICCTPCT in the last 72 hours. Urine analysis:    Component Value Date/Time   COLORURINE AMBER (A) 03/05/2019 0034   APPEARANCEUR HAZY (A) 03/05/2019 0034   LABSPEC 1.015 03/05/2019 0034   PHURINE 7.0 03/05/2019 0034   GLUCOSEU >=500 (A) 03/05/2019 0034   HGBUR LARGE (A) 03/05/2019 0034  BILIRUBINUR NEGATIVE 03/05/2019 0034   KETONESUR 15 (A) 03/05/2019 0034   PROTEINUR NEGATIVE 03/05/2019 0034   UROBILINOGEN 0.2 10/23/2012 0249   NITRITE POSITIVE (A) 03/05/2019 0034   LEUKOCYTESUR TRACE (A) 03/05/2019 0034    Radiological Exams on Admission - Personally Reviewed: CT Angio Abd/Pel W and/or Wo Contrast  Result Date: 03/17/2020 CLINICAL DATA:  Gastrointestinal hemorrhage, hematochezia EXAM: CTA ABDOMEN AND PELVIS WITHOUT AND WITH CONTRAST TECHNIQUE: Multidetector CT imaging of the abdomen and pelvis was performed using the standard protocol during bolus administration of intravenous contrast. Multiplanar reconstructed images and MIPs were obtained and reviewed to evaluate the vascular anatomy. CONTRAST:  OMNIPAQUE IOHEXOL 350 MG/ML SOLN COMPARISON:  03/05/2019, CTA 06/17/2016 FINDINGS: VASCULAR Aorta: Moderate mixed atherosclerotic plaque. No hemodynamically significant stenosis, aneurysm, or dissection. Celiac: Less than 50% stenosis at its origin. Classic anatomic configuration. Peripherally widely patent. SMA: Widely patent.  No peripheral aneurysm or fistula identified. Renals: Single right and dual left renal arteries. Right renal artery is widely patent. Superior left renal artery is widely patent. Inferior left renal artery demonstrates a roughly 50% stenosis at its origin. Renal arterial vasculature demonstrates normal contour. No peripheral aneurysm. IMA: Less than 50% stenosis at its origin. Inflow: Moderate mixed  atherosclerotic plaque results in a less than 50% stenosis of the right common iliac artery proximally. No evidence of hemodynamically significant stenosis. Right internal iliac artery demonstrates near occlusion at its origin. Greater than 50% stenosis of the left internal iliac artery at its origin. Proximal Outflow: 50% stenosis of the right profundus femoral artery at its origin. Less than 50% stenosis of the right SMA at its origin. Less than 50% stenosis of the left SMA at its origin. 60% stenosis of the left profundus femoral artery at its origin. Veins: Circumaortic left renal vein.  Otherwise unremarkable. Review of the MIP images confirms the above findings. NON-VASCULAR Lower chest: Visualized lung bases are clear. Right coronary artery stenting has been performed. Moderate left ventricular hypertrophy and mild left ventricular dilation. Global cardiac size is within normal limits. No pericardial effusion peer Hepatobiliary: No focal liver abnormality is seen. No gallstones, gallbladder wall thickening, or biliary dilatation. Pancreas: Unremarkable Spleen: Unremarkable Adrenals/Urinary Tract: 23 mm right adrenal adenoma is stable from multiple prior examinations. Left adrenal gland is unremarkable. Kidneys are unremarkable. Bladder is unremarkable. Stomach/Bowel: Stomach and small bowel are unremarkable. Sigmoid colectomy has been performed. There is moderate pancolonic diverticulosis involving the residual colon. The large bowel is otherwise unremarkable. No evidence of obstruction or focal inflammation. No evidence of active extravasation identified on this examination. Appendix normal. No free intraperitoneal gas or fluid. Lymphatic: No pathologic adenopathy within the abdomen and pelvis. Reproductive: Prostate is unremarkable. Other: Tiny fat containing umbilical hernia.  Rectum unremarkable. Musculoskeletal: No acute bone abnormality. Osseous structures are age-appropriate. IMPRESSION: VASCULAR No  active extravasation identified. No culprit lesion identified for the patient's reported gastrointestinal hemorrhage. Peripheral vascular disease with hemodynamically significant stenoses of the inferior left renal artery and profundus femoral artery origins bilaterally. NON-VASCULAR 23 mm right adrenal adenoma. Moderate left ventricular hypertrophy and mild left ventricular dilation. Moderate pancolonic diverticulosis.  Status post sigmoid colectomy. Aortic Atherosclerosis (ICD10-I70.0). Electronically Signed   By: Helyn Numbers MD   On: 03/17/2020 22:16    Telemetry: Personally reviewed.  Rhythm is sinus rhythm with heart rate of 84 bpm.    Assessment/Plan Principal Problem:   Rectal bleeding   Patient presenting with multiple episodes of gross rectal bleeding with mild drop in hemoglobin  to 11.7, from 13.7 approximately a year ago  Patient has a history of significant diverticulosis status post partial colectomy in 2011.,  Patient has history of lower GI bleed in the past and was last hospitalized in 05/2016 for suspected diverticular bleed that was managed conservatively.   Holding home regimen of aspirin  Hydrating patient gently with intravenous isotonic fluids  Serial CBCs to monitor hemoglobin and hematocrit  While unlikely to be an upper source of bleeding, patient has been placed on Protonix 40 mg IV every 12hrs  Epic secure chat consult request sent for nonurgent gastroenterology consultation the morning of 12/17  Patient will be kept n.p.o. in the meantime  Active Problems:   Acute blood loss anemia  Please see assessment and plan above    Coronary artery disease involving native coronary artery of native heart without angina pectoris  . Patient is currently chest pain free . Monitoring patient on telemetry . Continue home regimen of antiplatelet therapy, lipid lowering therapy and AV nodal blocking therapy    Type 2 diabetes mellitus without complication, without  long-term current use of insulin (HCC)  . Patient been placed on Accu-Cheks every 6 hours sliding scale insulin while patient is n.p.o. . Holding home regimen of hypoglycemics . Hemoglobin A1C ordered    Essential hypertension   Continue home regimen of antihypertensive therapy    GERD (gastroesophageal reflux disease)   Currently placed on IV PPI    Mixed hyperlipidemia due to type 2 diabetes mellitus (HCC)    Continuing Crestor daily   Code Status:  Full code Family Communication: deferred   Status is: Inpatient  Remains inpatient appropriate because:Ongoing diagnostic testing needed not appropriate for outpatient work up and Inpatient level of care appropriate due to severity of illness   Dispo: The patient is from: Home              Anticipated d/c is to: Home              Anticipated d/c date is: 3 days              Patient currently is not medically stable to d/c.        Marinda Elk MD [facility] Pager [phone]  If 7PM-7AM, please contact night-coverage [email or web address] Use universal Daisy password for that web site. If you do not have the password, please call the hospital operator.  03/18/2020, 8:17 AM

## 2020-03-18 NOTE — Consult Note (Signed)
Referring Provider: ED Primary Care Physician:  [facility] Primary Gastroenterologist:  Gentry Fitz  Reason for Consultation:  Hematochezia  HPI: Antonio [name] is a 59 y.o. male with history of diverticular bleeding (2016 and 2018), CAD (NSTEMI with DES), DM type 2 presenting for consultation of hematochezia.  Patient reports that his usual state of health until yesterday afternoon, when he started experiencing flank rectal bleeding.  He states he had a total of 6 episodes of bleeding yesterday but has not yet had a bowel movement today. He denies any melena.  Denies any abdominal pain.  He denies any shortness of breath, chest pain, presyncope, or syncope.   Patient reports history of diverticular bleeding, and states this is consistent with past episodes.  He takes 81 mg aspirin daily but denies other NSAID or aspirin use.  Denies blood thinner use.  Patient further denies nausea, vomiting, hematemesis, dysphagia, changes in appetite, early satiety, unexplained weight loss.    Denies any family history of colon cancer or gastrointestinal malignancy.  Last colonoscopy 10/22/12 : small amount of blood in ?transverse colon 60-70 cm, fresh blood but no bleeding site, moderate diverticulosis in transverse colon, thought to be cause of bleeding 10/18/12 colonoscopy: moderate diverticulosis in descending colon, mild diverticulosis in transverse and ascending colon, normal mucosa; acute GI bleeding thought to be from diverticulosis  10/19/22 EGD: normal  Past Medical History:  Diagnosis Date  . Coronary artery disease   . Diverticulosis   . GERD (gastroesophageal reflux disease)   . Heart attack (HCC) 2011; 2017  . Heart murmur   . Hemorrhoids   . High cholesterol   . History of blood transfusion 2011; 2017; 05/2016   "elated to diverticulitis"  . History of kidney stones   . Hyperlipidemia due to type 2 diabetes mellitus (HCC)   . Hypertension   . Lower GI bleed 2011; 2017;  05/2016  . PTSD (post-traumatic stress disorder)    PTSD  . Type II diabetes mellitus (HCC)     Past Surgical History:  Procedure Laterality Date  . CARDIAC CATHETERIZATION N/A 12/23/2015   Procedure: Left Heart Cath and Coronary Angiography;  Surgeon: Peter M Swaziland, MD;  Location: [facility] INVASIVE CV LAB;  Service: Cardiovascular;  Laterality: N/A;  . CARDIAC CATHETERIZATION N/A 12/23/2015   Procedure: Coronary Stent Intervention;  Surgeon: Peter M Swaziland, MD;  Location: [facility] INVASIVE CV LAB;  Service: Cardiovascular;  Laterality: N/A;  . COLON RESECTION  2011   12 inches taken out in 2011 at [facility] for diverticulosis  . COLON SURGERY    . COLONOSCOPY N/A 10/18/2012   Procedure: COLONOSCOPY;  Surgeon: Louis Meckel, MD;  Location: [facility] ENDOSCOPY;  Service: Endoscopy;  Laterality: N/A;  . COLONOSCOPY N/A 10/22/2012   Procedure: COLONOSCOPY;  Surgeon: Iva Boop, MD;  Location: [facility] ENDOSCOPY;  Service: Endoscopy;  Laterality: N/A;  . CORONARY ANGIOPLASTY WITH STENT PLACEMENT  2011   at [facility], 2 stents, locations unknown  . CYSTOSCOPY/URETEROSCOPY/HOLMIUM LASER/STENT PLACEMENT Right 03/23/2019   Procedure: CYSTOSCOPY/URETEROSCOPY/HOLMIUM LASER/STENT PLACEMENT;  Surgeon: Heloise Purpura, MD;  Location: [facility];  Service: Urology;  Laterality: Right;  . ESOPHAGOGASTRODUODENOSCOPY N/A 10/18/2012   Procedure: ESOPHAGOGASTRODUODENOSCOPY (EGD);  Surgeon: Louis Meckel, MD;  Location: [facility] ENDOSCOPY;  Service: Endoscopy;  Laterality: N/A;  . INGUINAL HERNIA REPAIR Right   . IRRIGATION AND DEBRIDEMENT ABSCESS N/A 10/26/2012   Procedure: IRRIGATION AND DEBRIDEMENT PERINEAL ABSCESS;  Surgeon: Wilmon Arms. Corliss Skains, MD;  Location: [facility] OR;  Service: General;  Laterality: N/A;    Prior to Admission medications   Medication Sig Start Date End Date Taking? Authorizing Provider  amLODipine (NORVASC) 10 MG tablet Take 10 mg by mouth daily.   Yes [provider]   aspirin EC 81 MG tablet Take 1 tablet (81 mg total) by mouth daily. 12/24/15  Yes Little Ishikawa, NP  atorvastatin (LIPITOR) 80 MG tablet Take 80 mg by mouth daily.   Yes [provider]  Cholecalciferol (VITAMIN D) 50 MCG (2000 UT) CAPS Take 2,000 Units by mouth daily.   Yes [provider]  empagliflozin (JARDIANCE) 25 MG TABS tablet Take 12.5 mg by mouth daily.   Yes [provider]  lisinopril (ZESTRIL) 40 MG tablet Take 40 mg by mouth daily.   Yes [provider]  metFORMIN (GLUCOPHAGE-XR) 500 MG 24 hr tablet Take 1,000 mg by mouth daily. 03/06/20  Yes [provider]  metoprolol (TOPROL-XL) 200 MG 24 hr tablet Take 200 mg by mouth daily.   Yes [provider]  nitroGLYCERIN (NITROSTAT) 0.4 MG SL tablet Place 0.4 mg under the tongue every 5 (five) minutes as needed for chest pain.   Yes [provider]  pantoprazole (PROTONIX) 40 MG tablet Take 40 mg by mouth daily. For heartburn   Yes [provider]  rosuvastatin (CRESTOR) 40 MG tablet Take 40 mg by mouth daily.   Yes [provider]  Semaglutide,0.25 or 0.5MG /DOS, (OZEMPIC, 0.25 OR 0.5 MG/DOSE,) 2 MG/1.5ML SOPN Inject 0.5 mg into the skin every Monday.   Yes [provider]  vitamin B-12 (CYANOCOBALAMIN) 1000 MCG tablet Take 1,000 mcg by mouth daily.   Yes [provider]  HYDROcodone-acetaminophen (NORCO/VICODIN) 5-325 MG tablet Take 1-2 tablets by mouth every 6 (six) hours as needed. Patient not taking: No sig reported 03/23/19   Heloise Purpura, MD  ondansetron (ZOFRAN ODT) 8 MG disintegrating tablet Take 1 tablet (8 mg total) by mouth every 8 (eight) hours as needed for nausea or vomiting. Patient not taking: No sig reported 03/05/19   Molpus, John, MD  tamsulosin (FLOMAX) 0.4 MG CAPS capsule Take 1 capsule daily. Patient not taking: No sig reported 03/05/19   Molpus, John, MD    Scheduled Meds: . amLODipine  10 mg Oral Daily  . insulin  aspart  0-15 Units Subcutaneous Q6H  . lisinopril  40 mg Oral Daily  . metoprolol  200 mg Oral Daily  . pantoprazole (PROTONIX) IV  40 mg Intravenous Q12H  . rosuvastatin  40 mg Oral Daily   Continuous Infusions: . lactated ringers    . potassium chloride     PRN Meds:.acetaminophen **OR** acetaminophen, nitroGLYCERIN, ondansetron **OR** ondansetron (ZOFRAN) IV, polyethylene glycol  Allergies as of 03/17/2020  . (No Known Allergies)    Family History  Problem Relation Age of Onset  . Alzheimer's disease Mother 68  . Heart attack Father 18  . CAD Brother 40       triple bypass    Social History   Socioeconomic History  . Marital status: Married    Spouse name: Not on file  . Number of children: Not on file  . Years of education: Not on file  . Highest education level: Not on file  Occupational History  . Occupation: Retired Loss adjuster, [employer]  . Smoking status: Former Smoker    Packs/day: 0.50    Years: 20.00    Pack years: 10.00    Types: Cigarettes    Quit  date: 10/19/2002    Years since quitting: 17.4  . Smokeless tobacco: Never Used  Vaping Use  . Vaping Use: Never used  Substance and Sexual Activity  . Alcohol use: Yes    Comment: occ  . Drug use: Yes    Types: Marijuana  . Sexual activity: Not on file  Other Topics Concern  . Not on file  Social History Narrative   Lives with wife in [place].   Social Determinants of Health   Financial Resource Strain: Not on file  Food Insecurity: Not on file  Transportation Needs: Not on file  Physical Activity: Not on file  Stress: Not on file  Social Connections: Not on file  Intimate Partner Violence: Not on file    Review of Systems: Review of Systems  Constitutional: Negative for chills, fever and weight loss.  HENT: Negative for hearing loss and tinnitus.   Eyes: Negative for pain and redness.  Respiratory: Negative for cough and shortness of breath.   Cardiovascular: Negative for chest pain and  palpitations.  Gastrointestinal: Positive for blood in stool. Negative for abdominal pain, constipation, diarrhea, heartburn, melena, nausea and vomiting.  Genitourinary: Negative for flank pain and hematuria.  Musculoskeletal: Negative for falls and joint pain.  Skin: Negative for itching and rash.  Neurological: Negative for seizures and loss of consciousness.  Endo/Heme/Allergies: Negative for polydipsia. Does not bruise/bleed easily.  Psychiatric/Behavioral: Negative for substance abuse. The patient is not nervous/anxious.     Physical Exam: Vital signs: Vitals:   03/18/20 0200 03/18/20 0311  BP: (!) 172/98 (!) 165/93  Pulse: 80 79  Resp: 20 20  Temp: 98.6 F (37 C) 98.2 F (36.8 C)  SpO2: 98% 99%   Last BM Date: 03/17/20  Physical Exam Vitals reviewed.  Constitutional:      General: He is not in acute distress. HENT:     Head: Normocephalic and atraumatic.     Nose: Nose normal. No congestion.     Mouth/Throat:     Mouth: Mucous membranes are moist.     Pharynx: Oropharynx is clear.  Eyes:     General: No scleral icterus.    Extraocular Movements: Extraocular movements intact.     Conjunctiva/sclera: Conjunctivae normal.  Cardiovascular:     Rate and Rhythm: Normal rate and regular rhythm.     Pulses: Normal pulses.  Pulmonary:     Effort: Pulmonary effort is normal. No respiratory distress.     Breath sounds: Normal breath sounds.  Abdominal:     General: Bowel sounds are normal. There is no distension.     Palpations: Abdomen is soft. There is no mass.     Tenderness: There is no abdominal tenderness. There is no guarding or rebound.     Hernia: No hernia is present.  Musculoskeletal:        General: No swelling or tenderness.     Cervical back: Normal range of motion and neck supple.  Skin:    General: Skin is warm and dry.  Neurological:     General: No focal deficit present.     Mental Status: He is alert and oriented to person, place, and time.   Psychiatric:        Mood and Affect: Mood normal.        Behavior: Behavior normal. Behavior is cooperative.      GI:  Lab Results: Recent Labs    03/17/20 2045 03/18/20 0156 03/18/20 0650  WBC 7.0 7.5 7.1  HGB 11.7*  11.3* 11.5*  HCT 35.3* 34.0* 35.2*  PLT 233 235 217   BMET Recent Labs    03/17/20 2045 03/18/20 0650  NA 139 137  K 3.5 3.4*  CL 104 105  CO2 26 24  GLUCOSE 151* 137*  BUN 17 13  CREATININE 1.02 0.91  CALCIUM 9.0 8.5*   LFT Recent Labs    03/18/20 0650  PROT 6.7  ALBUMIN 3.8  AST 17  ALT 14  ALKPHOS 67  BILITOT 0.5   PT/INR Recent Labs    03/17/20 2142  LABPROT 13.3  INR 1.1     Studies/Results: CT Angio Abd/Pel W and/or Wo Contrast  Result Date: 03/17/2020 CLINICAL DATA:  Gastrointestinal hemorrhage, hematochezia EXAM: CTA ABDOMEN AND PELVIS WITHOUT AND WITH CONTRAST TECHNIQUE: Multidetector CT imaging of the abdomen and pelvis was performed using the standard protocol during bolus administration of intravenous contrast. Multiplanar reconstructed images and MIPs were obtained and reviewed to evaluate the vascular anatomy. CONTRAST:  OMNIPAQUE IOHEXOL 350 MG/ML SOLN COMPARISON:  03/05/2019, CTA 06/17/2016 FINDINGS: VASCULAR Aorta: Moderate mixed atherosclerotic plaque. No hemodynamically significant stenosis, aneurysm, or dissection. Celiac: Less than 50% stenosis at its origin. Classic anatomic configuration. Peripherally widely patent. SMA: Widely patent.  No peripheral aneurysm or fistula identified. Renals: Single right and dual left renal arteries. Right renal artery is widely patent. Superior left renal artery is widely patent. Inferior left renal artery demonstrates a roughly 50% stenosis at its origin. Renal arterial vasculature demonstrates normal contour. No peripheral aneurysm. IMA: Less than 50% stenosis at its origin. Inflow: Moderate mixed atherosclerotic plaque results in a less than 50% stenosis of the right common iliac  artery proximally. No evidence of hemodynamically significant stenosis. Right internal iliac artery demonstrates near occlusion at its origin. Greater than 50% stenosis of the left internal iliac artery at its origin. Proximal Outflow: 50% stenosis of the right profundus femoral artery at its origin. Less than 50% stenosis of the right SMA at its origin. Less than 50% stenosis of the left SMA at its origin. 60% stenosis of the left profundus femoral artery at its origin. Veins: Circumaortic left renal vein.  Otherwise unremarkable. Review of the MIP images confirms the above findings. NON-VASCULAR Lower chest: Visualized lung bases are clear. Right coronary artery stenting has been performed. Moderate left ventricular hypertrophy and mild left ventricular dilation. Global cardiac size is within normal limits. No pericardial effusion peer Hepatobiliary: No focal liver abnormality is seen. No gallstones, gallbladder wall thickening, or biliary dilatation. Pancreas: Unremarkable Spleen: Unremarkable Adrenals/Urinary Tract: 23 mm right adrenal adenoma is stable from multiple prior examinations. Left adrenal gland is unremarkable. Kidneys are unremarkable. Bladder is unremarkable. Stomach/Bowel: Stomach and small bowel are unremarkable. Sigmoid colectomy has been performed. There is moderate pancolonic diverticulosis involving the residual colon. The large bowel is otherwise unremarkable. No evidence of obstruction or focal inflammation. No evidence of active extravasation identified on this examination. Appendix normal. No free intraperitoneal gas or fluid. Lymphatic: No pathologic adenopathy within the abdomen and pelvis. Reproductive: Prostate is unremarkable. Other: Tiny fat containing umbilical hernia.  Rectum unremarkable. Musculoskeletal: No acute bone abnormality. Osseous structures are age-appropriate. IMPRESSION: VASCULAR No active extravasation identified. No culprit lesion identified for the patient's  reported gastrointestinal hemorrhage. Peripheral vascular disease with hemodynamically significant stenoses of the inferior left renal artery and profundus femoral artery origins bilaterally. NON-VASCULAR 23 mm right adrenal adenoma. Moderate left ventricular hypertrophy and mild left ventricular dilation. Moderate pancolonic diverticulosis.  Status post sigmoid colectomy. Aortic Atherosclerosis (  ICD10-I70.0). Electronically Signed   By: Helyn Numbers MD   On: 03/17/2020 22:16    Impression: Painless hematochezia most consistent with diverticular bleeding, currently quiescent.  Patient with known diverticulosis and history of diverticular bleeding in 2016 and 2018. -Hemoglobin 11.5, decreased from baseline 13.7 as of 03/2019 -Hemodynamically stable  CAD, on 81 mg aspirin daily  Plan: Clear liquid diet.  Continue to monitor for signs of bleeding.  If rebleeding occurs, consider nuclear bleeding scan.  If destabilizing bleeding occurs, recommend CT-A and if positive, consult with IR for possible embolization.  Continue to monitor H&H with transfusion as needed to maintain hemoglobin greater than 7-8.  [facility] will follow.    LOS: 0 days   Edrick Kins  PA-C 03/18/2020, 8:21 AM  Contact #  [phone]

## 2020-03-19 LAB — BASIC METABOLIC PANEL
Anion gap: 12 (ref 5–15)
BUN: 9 mg/dL (ref 6–20)
CO2: 24 mmol/L (ref 22–32)
Calcium: 8.9 mg/dL (ref 8.9–10.3)
Chloride: 100 mmol/L (ref 98–111)
Creatinine, Ser: 0.94 mg/dL (ref 0.61–1.24)
GFR, Estimated: >60 mL/min (ref >60)
Glucose, Bld: 133 mg/dL — ABNORMAL HIGH (ref 70–99)
Potassium: 3.6 mmol/L (ref 3.5–5.1)
Sodium: 136 mmol/L (ref 135–145)

## 2020-03-19 LAB — CBC
HCT: 38.2 % — ABNORMAL LOW (ref 39.0–52.0)
Hemoglobin: 12.5 g/dL — ABNORMAL LOW (ref 13.0–17.0)
MCH: 28.5 pg (ref 26.0–34.0)
MCHC: 32.7 g/dL (ref 30.0–36.0)
MCV: 87 fL (ref 80.0–100.0)
Platelets: 248 10*3/uL (ref 150–400)
RBC: 4.39 MIL/uL (ref 4.22–5.81)
RDW: 14.6 % (ref 11.5–15.5)
WBC: 7.3 10*3/uL (ref 4.0–10.5)
nRBC: 0 % (ref 0.0–0.2)

## 2020-03-19 LAB — GLUCOSE, CAPILLARY
Glucose-Capillary: 122 mg/dL — ABNORMAL HIGH (ref 70–99)
Glucose-Capillary: 138 mg/dL — ABNORMAL HIGH (ref 70–99)
Glucose-Capillary: 138 mg/dL — ABNORMAL HIGH (ref 70–99)
Glucose-Capillary: 143 mg/dL — ABNORMAL HIGH (ref 70–99)

## 2020-03-19 LAB — MAGNESIUM: Magnesium: 1.9 mg/dL (ref 1.7–2.4)

## 2020-03-19 NOTE — Progress Notes (Signed)
PROGRESS NOTE    Antonio [name]  XBM:[id number] DOB: 20-Jun-1960 DOA: 03/17/2020 PCP: [facility]     Brief Narrative:  Antonio [name] is a 59 year old male with past medical history significant for coronary artery disease(S/P NSTEMI with DES x 2 to RCA),hypertension, diabetes mellitus type 2, gastroesophageal reflux disease, hyperlipidemia and priorlower GIbleed in the past (2018)presented with chief complaint of bright red blood per rectum. [facility] department, patient exhibited 3 more episodes of bright red blood per rectum. Patient was noted to be hemodynamically stable. Patient was noted to have a hemoglobin of 11.7, down from 13.7. CT angiogram of the abdomen revealed pancolonic diverticulosis but did not reveal any source of acute bleeding. Patient was admitted and GI consulted.   New events last 24 hours / Subjective: Had 1 BM with blood yesterday but none since. Feeling well overall. Eating breakfast this morning.   Assessment & Plan:   Principal Problem:   Rectal bleeding Active Problems:   Acute blood loss anemia   Coronary artery disease involving native coronary artery of native heart without angina pectoris   Type 2 diabetes mellitus without complication, without long-term current use of insulin (HCC)   Essential hypertension   Mixed hyperlipidemia due to type 2 diabetes mellitus (HCC)   GERD (gastroesophageal reflux disease)    Rectal bleeding, suspected diverticular bleed, with acute blood loss anemia -CTA A/P: No active extravasation identified. No culprit lesion identified for the patient's reported gastrointestinal hemorrhage. Moderate pancolonic diverticulosis. Status post sigmoid colectomy. -Continue to trend hemoglobin -Hold home aspirin -GI following   CAD -Stable -Hold aspirin.  Continue Crestor, Toprol  Diabetes mellitus type 2, well controlled -Hemoglobin A1c 6.6 -Sliding scale  insulin  Essential hypertension -Continue Norvasc, lisinopril, Toprol  GERD -PPI    DVT prophylaxis:  SCDs Start: 03/18/20 0600  Code Status: Full Family Communication: None at bedside  Disposition Plan:  Status is: Inpatient  Remains inpatient appropriate because:Inpatient level of care appropriate due to severity of illness   Dispo: The patient is from: Home              Anticipated d/c is to: Home              Anticipated d/c date is: 1 day              Patient currently is not medically stable to d/c. Continue to monitor per GI. Hopeful discharge home 12/19.   Consultants:   GI  Procedures:   None   Antimicrobials:  Anti-infectives (From admission, onward)   None        Objective: Vitals:   03/18/20 1311 03/18/20 2355 03/19/20 0600 03/19/20 1354  BP: (!) 168/92 (!) 154/100 (!) 156/98 (!) 157/95  Pulse: 73 70 67 72  Resp: 18 18 19 16   Temp:  98.8 F (37.1 C) 98.8 F (37.1 C) 99 F (37.2 C)  TempSrc:  Oral Oral   SpO2: 99% 100%  100%  Weight:      Height:        Intake/Output Summary (Last 24 hours) at 03/19/2020 1537 Last data filed at 03/19/2020 0600 Gross per 24 hour  Intake 2247.35 ml  Output 1500 ml  Net 747.35 ml   Filed Weights   03/17/20 2008  Weight: 88.5 kg    Examination: General exam: Appears calm and comfortable  Respiratory system: Clear to auscultation. Respiratory effort normal. No respiratory distress. No conversational dyspnea.  Cardiovascular system:  S1 & S2 heard, RRR. No murmurs. No pedal edema. Gastrointestinal system: Abdomen is nondistended, soft and nontender. Normal bowel sounds heard. Central nervous system: Alert and oriented. No focal neurological deficits. Speech clear.  Extremities: Symmetric in appearance  Skin: No rashes, lesions or ulcers on exposed skin  Psychiatry: Judgement and insight appear normal. Mood & affect appropriate.   Data Reviewed: I have personally reviewed following labs and  imaging studies  CBC: Recent Labs  Lab 03/17/20 2045 03/18/20 0156 03/18/20 0650 03/18/20 1330 03/19/20 0449  WBC 7.0 7.5 7.1 9.1 7.3  HGB 11.7* 11.3* 11.5* 12.0* 12.5*  HCT 35.3* 34.0* 35.2* 37.6* 38.2*  MCV 86.5 86.5 87.3 87.2 87.0  PLT 233 235 217 249 248   Basic Metabolic Panel: Recent Labs  Lab 03/17/20 2045 03/18/20 0650 03/19/20 0449  NA 139 137 136  K 3.5 3.4* 3.6  CL 104 105 100  CO2 26 24 24   GLUCOSE 151* 137* 133*  BUN 17 13 9   CREATININE 1.02 0.91 0.94  CALCIUM 9.0 8.5* 8.9  MG  --   --  1.9   GFR: Estimated Creatinine Clearance: 88.8 mL/min (by C-G formula based on SCr of 0.94 mg/dL). Liver Function Tests: Recent Labs  Lab 03/17/20 2045 03/18/20 0650  AST 16 17  ALT 16 14  ALKPHOS 67 67  BILITOT 0.5 0.5  PROT 7.0 6.7  ALBUMIN 3.7 3.8   No results for input(s): LIPASE, AMYLASE in the last 168 hours. No results for input(s): AMMONIA in the last 168 hours. Coagulation Profile: Recent Labs  Lab 03/17/20 2142  INR 1.1   Cardiac Enzymes: No results for input(s): CKTOTAL, CKMB, CKMBINDEX, TROPONINI in the last 168 hours. BNP (last 3 results) No results for input(s): PROBNP in the last 8760 hours. HbA1C: Recent Labs    03/18/20 0650  HGBA1C 6.6*   CBG: Recent Labs  Lab 03/18/20 1133 03/18/20 1633 03/18/20 2356 03/19/20 0609 03/19/20 1158  GLUCAP 131* 132* 112* 138* 122*   Lipid Profile: No results for input(s): CHOL, HDL, LDLCALC, TRIG, CHOLHDL, LDLDIRECT in the last 72 hours. Thyroid Function Tests: No results for input(s): TSH, T4TOTAL, FREET4, T3FREE, THYROIDAB in the last 72 hours. Anemia Panel: No results for input(s): VITAMINB12, FOLATE, FERRITIN, TIBC, IRON, RETICCTPCT in the last 72 hours. Sepsis Labs: No results for input(s): PROCALCITON, LATICACIDVEN in the last 168 hours.  Recent Results (from the past 240 hour(s))  Resp Panel by RT-PCR (Flu A&B, Covid) Nasopharyngeal Swab     Status: None   Collection Time: 03/17/20   9:42 PM   Specimen: Nasopharyngeal Swab; Nasopharyngeal(NP) swabs in vial transport medium  Result Value Ref Range Status   SARS Coronavirus 2 by RT PCR NEGATIVE NEGATIVE Final    Comment: (NOTE) SARS-CoV-2 target nucleic acids are NOT DETECTED.  The SARS-CoV-2 RNA is generally detectable in upper respiratory specimens during the acute phase of infection. The lowest concentration of SARS-CoV-2 viral copies this assay can detect is 138 copies/mL. A negative result does not preclude SARS-Cov-2 infection and should not be used as the sole basis for treatment or other patient management decisions. A negative result may occur with  improper specimen collection/handling, submission of specimen other than nasopharyngeal swab, presence of viral mutation(s) within the areas targeted by this assay, and inadequate number of viral copies(<138 copies/mL). A negative result must be combined with clinical observations, patient history, and epidemiological information. The expected result is Negative.  Fact Sheet for Patients:  03/21/20  Fact Sheet for  Healthcare Providers:  SeriousBroker.it  This test is no t yet approved or cleared by the [place] and  has been authorized for detection and/or diagnosis of SARS-CoV-2 by FDA under an Emergency Use Authorization (EUA). This EUA will remain  in effect (meaning this test can be used) for the duration of the COVID-19 declaration under Section 564(b)(1) of the Act, 21 U.S.C.section 360bbb-3(b)(1), unless the authorization is terminated  or revoked sooner.       Influenza A by PCR NEGATIVE NEGATIVE Final   Influenza B by PCR NEGATIVE NEGATIVE Final    Comment: (NOTE) The Xpert Xpress SARS-CoV-2/FLU/RSV plus assay is intended as an aid in the diagnosis of influenza from Nasopharyngeal swab specimens and should not be used as a sole basis for treatment. Nasal washings and aspirates  are unacceptable for Xpert Xpress SARS-CoV-2/FLU/RSV testing.  Fact Sheet for Patients: BloggerCourse.com  Fact Sheet for Healthcare Providers: SeriousBroker.it  This test is not yet approved or cleared by the [place] FDA and has been authorized for detection and/or diagnosis of SARS-CoV-2 by FDA under an Emergency Use Authorization (EUA). This EUA will remain in effect (meaning this test can be used) for the duration of the COVID-19 declaration under Section 564(b)(1) of the Act, 21 U.S.C. section 360bbb-3(b)(1), unless the authorization is terminated or revoked.  Performed at [facility], [address] [place], [place] [address]       Radiology Studies: CT Angio Abd/Pel W and/or Wo Contrast  Result Date: 03/17/2020 CLINICAL DATA:  Gastrointestinal hemorrhage, hematochezia EXAM: CTA ABDOMEN AND PELVIS WITHOUT AND WITH CONTRAST TECHNIQUE: Multidetector CT imaging of the abdomen and pelvis was performed using the standard protocol during bolus administration of intravenous contrast. Multiplanar reconstructed images and MIPs were obtained and reviewed to evaluate the vascular anatomy. CONTRAST:  OMNIPAQUE IOHEXOL 350 MG/ML SOLN COMPARISON:  03/05/2019, CTA 06/17/2016 FINDINGS: VASCULAR Aorta: Moderate mixed atherosclerotic plaque. No hemodynamically significant stenosis, aneurysm, or dissection. Celiac: Less than 50% stenosis at its origin. Classic anatomic configuration. Peripherally widely patent. SMA: Widely patent.  No peripheral aneurysm or fistula identified. Renals: Single right and dual left renal arteries. Right renal artery is widely patent. Superior left renal artery is widely patent. Inferior left renal artery demonstrates a roughly 50% stenosis at its origin. Renal arterial vasculature demonstrates normal contour. No peripheral aneurysm. IMA: Less than 50% stenosis at its origin. Inflow: Moderate mixed  atherosclerotic plaque results in a less than 50% stenosis of the right common iliac artery proximally. No evidence of hemodynamically significant stenosis. Right internal iliac artery demonstrates near occlusion at its origin. Greater than 50% stenosis of the left internal iliac artery at its origin. Proximal Outflow: 50% stenosis of the right profundus femoral artery at its origin. Less than 50% stenosis of the right SMA at its origin. Less than 50% stenosis of the left SMA at its origin. 60% stenosis of the left profundus femoral artery at its origin. Veins: Circumaortic left renal vein.  Otherwise unremarkable. Review of the MIP images confirms the above findings. NON-VASCULAR Lower chest: Visualized lung bases are clear. Right coronary artery stenting has been performed. Moderate left ventricular hypertrophy and mild left ventricular dilation. Global cardiac size is within normal limits. No pericardial effusion peer Hepatobiliary: No focal liver abnormality is seen. No gallstones, gallbladder wall thickening, or biliary dilatation. Pancreas: Unremarkable Spleen: Unremarkable Adrenals/Urinary Tract: 23 mm right adrenal adenoma is stable from multiple prior examinations. Left adrenal gland is unremarkable. Kidneys are unremarkable. Bladder  is unremarkable. Stomach/Bowel: Stomach and small bowel are unremarkable. Sigmoid colectomy has been performed. There is moderate pancolonic diverticulosis involving the residual colon. The large bowel is otherwise unremarkable. No evidence of obstruction or focal inflammation. No evidence of active extravasation identified on this examination. Appendix normal. No free intraperitoneal gas or fluid. Lymphatic: No pathologic adenopathy within the abdomen and pelvis. Reproductive: Prostate is unremarkable. Other: Tiny fat containing umbilical hernia.  Rectum unremarkable. Musculoskeletal: No acute bone abnormality. Osseous structures are age-appropriate. IMPRESSION: VASCULAR No  active extravasation identified. No culprit lesion identified for the patient's reported gastrointestinal hemorrhage. Peripheral vascular disease with hemodynamically significant stenoses of the inferior left renal artery and profundus femoral artery origins bilaterally. NON-VASCULAR 23 mm right adrenal adenoma. Moderate left ventricular hypertrophy and mild left ventricular dilation. Moderate pancolonic diverticulosis.  Status post sigmoid colectomy. Aortic Atherosclerosis (ICD10-I70.0). Electronically Signed   By: Helyn Numbers MD   On: 03/17/2020 22:16      Scheduled Meds: . amLODipine  10 mg Oral Daily  . insulin aspart  0-15 Units Subcutaneous Q6H  . lisinopril  40 mg Oral Daily  . metoprolol  200 mg Oral Daily  . rosuvastatin  40 mg Oral Daily   Continuous Infusions:   LOS: 1 day      Time spent: 20 minutes   Noralee Stain, DO [facility] 03/19/2020, 3:37 PM   Available via Epic secure chat 7am-7pm After these hours, please refer to coverage provider listed on [email or web address]

## 2020-03-19 NOTE — Progress Notes (Signed)
Patient reports no bleeding for the past 24 hours.    Patient's hemoglobin of 12.5 is actually improved since admission and improved from yesterday afternoon.  For comparison, the patient's hemoglobin, a year ago, was 13.7.  Case discussed with Dr. Noralee Stain, the attending hospitalist.  In my opinion, it is prudent to keep this patient 1 more day in the hospital, not because of his current status (which is excellent), but because of a fairly frequent tendency for diverticular bleeding to recur after a period of quiescence.  The patient and his wife are okay with this.  I do feel discharge tomorrow would be appropriate unless the patient shows signs of rebleeding in the interim.  Florencia Reasons, M.D. Pager [phone] If no answer or after 5 PM call [phone]

## 2020-03-20 DIAGNOSIS — K625 Hemorrhage of anus and rectum: Secondary | ICD-10-CM

## 2020-03-20 LAB — CBC
HCT: 40.1 % (ref 39.0–52.0)
Hemoglobin: 13.1 g/dL (ref 13.0–17.0)
MCH: 28.4 pg (ref 26.0–34.0)
MCHC: 32.7 g/dL (ref 30.0–36.0)
MCV: 86.8 fL (ref 80.0–100.0)
Platelets: 252 10*3/uL (ref 150–400)
RBC: 4.62 MIL/uL (ref 4.22–5.81)
RDW: 14.6 % (ref 11.5–15.5)
WBC: 6.9 10*3/uL (ref 4.0–10.5)
nRBC: 0 % (ref 0.0–0.2)

## 2020-03-20 LAB — GLUCOSE, CAPILLARY
Glucose-Capillary: 148 mg/dL — ABNORMAL HIGH (ref 70–99)
Glucose-Capillary: 148 mg/dL — ABNORMAL HIGH (ref 70–99)
Glucose-Capillary: 145 mg/dL — ABNORMAL HIGH (ref 70–99)

## 2020-03-20 NOTE — Progress Notes (Signed)
2 dark stools--formed, no fresh blood.  Up and around walking.  Looks terrific.  IMRP:  Quiescent diverticular bleed without significant anemia  PLAN:  Agree w/ plans for dischg.  Have provided pt parameters to differentiate passage of old bld from active bleeding and he knows to call if active bleeding appears to be occurring.  Have provided my card so he can call our office to arr f/u visit during January to test for occult bld in the stool via home hemoccults.  If neg, could be placed on 10/2022 f/u colon list (I.e., 10 yrs from time of most recent colonoscopy, done at hosp 09/2012 for diverticular bleeding at that time).  Florencia Reasons, M.D. Pager [phone] If no answer or after 5 PM call [phone]

## 2020-03-20 NOTE — Progress Notes (Signed)
Discharge to home, instructions reviewed with pt. Acknowledged understanding of instructions. Spouse arrive to transport pt home. SRP, RN

## 2020-03-20 NOTE — Discharge Summary (Signed)
Physician Discharge Summary  Antonio [name] ZOX:[id number] DOB: Feb 20, 1961 DOA: 03/17/2020  PCP: [facility]  Admit date: 03/17/2020 Discharge date: 03/20/2020  Admitted From: Home Disposition: Home Recommendations for Outpatient Follow-up:  1. Follow up with PCP in 1-2 weeks 2. Please obtain BMP/CBC in one week  Home Health none Equipment/Devices none Discharge Condition stable CODE STATUS: Full code Diet recommendation: Cardiac Brief/Interim Summary:Antonio Ashley Royalty Smithis a84 year old male with past medical history significant forcoronary artery disease(S/P NSTEMI with DES x 2 to RCA),hypertension, diabetes mellitus type 2, gastroesophageal reflux disease, hyperlipidemia and priorlower GIbleed in the past (2018)presentedwith chief complaint ofbright red blood per rectum. [facility], patient exhibited 3 more episodes of bright red blood per rectum. Patient was noted to be hemodynamically stable. Patient was noted to have a hemoglobin of 11.7, down from 13.7.CT angiogram of the abdomen revealed pancolonic diverticulosis but did not reveal any source of acute bleeding.Patient was admitted and GI consulted.   Discharge Diagnoses:  Principal Problem:   Rectal bleeding Active Problems:   Acute blood loss anemia   Coronary artery disease involving native coronary artery of native heart without angina pectoris   Type 2 diabetes mellitus without complication, without long-term current use of insulin (HCC)   Essential hypertension   Mixed hyperlipidemia due to type 2 diabetes mellitus (HCC)   GERD (gastroesophageal reflux disease)  Rectal bleeding, suspected diverticular bleed, with acute blood loss anemia -CTA A/P:No active extravasation identified. No culprit lesion identified for the patient's reported gastrointestinal hemorrhage. Moderate pancolonic diverticulosis. Status post sigmoid colectomy. His  hemoglobin on the day of discharge was 13.1.  He did not require any blood transfusion.  He was seen by GI during this admission.  CAD -Stable continue Crestor aspirin and Toprol.  Diabetes mellitus type 2,well controlled -Hemoglobin A1c 6.6  Essential hypertension -Continue Norvasc, lisinopril, Toprol  GERD -PPI   Estimated body mass index is 30.54 kg/m as calculated from the following:   Height as of this encounter:  (1.702 m).   Weight as of this encounter: 88.5 kg.  Discharge Instructions  Discharge Instructions    Call MD for:  difficulty breathing, headache or visual disturbances   Complete by: As directed    Call MD for:  extreme fatigue   Complete by: As directed    Call MD for:  persistant dizziness or light-headedness   Complete by: As directed    Call MD for:  persistant nausea and vomiting   Complete by: As directed    Call MD for:  severe uncontrolled pain   Complete by: As directed    Call MD for:  temperature >100.4   Complete by: As directed    Diet - low sodium heart healthy   Complete by: As directed    Discharge instructions   Complete by: As directed    You were cared for by a hospitalist during your hospital stay. If you have any questions about your discharge medications or the care you received while you were in the hospital after you are discharged, you can call the unit and ask to speak with the hospitalist on call if the hospitalist that took care of you is not available. Once you are discharged, your primary care physician will handle any further medical issues. Please note that NO REFILLS for any discharge medications will be authorized once you are discharged, as it is imperative that you return to your primary care physician (or establish a relationship  with a primary care physician if you do not have one) for your aftercare needs so that they can reassess your need for medications and monitor your lab values.   Increase activity slowly    Complete by: As directed    Increase activity slowly   Complete by: As directed      Allergies as of 03/20/2020   No Known Allergies     Medication List    STOP taking these medications   aspirin EC 81 MG tablet   atorvastatin 80 MG tablet Commonly known as: LIPITOR   HYDROcodone-acetaminophen 5-325 MG tablet Commonly known as: NORCO/VICODIN   ondansetron 8 MG disintegrating tablet Commonly known as: Zofran ODT   tamsulosin 0.4 MG Caps capsule Commonly known as: Flomax     TAKE these medications   amLODipine 10 MG tablet Commonly known as: NORVASC Take 10 mg by mouth daily.   Jardiance 25 MG Tabs tablet Generic drug: empagliflozin Take 12.5 mg by mouth daily.   lisinopril 40 MG tablet Commonly known as: ZESTRIL Take 40 mg by mouth daily.   metFORMIN 500 MG 24 hr tablet Commonly known as: GLUCOPHAGE-XR Take 1,000 mg by mouth daily.   metoprolol 200 MG 24 hr tablet Commonly known as: TOPROL-XL Take 200 mg by mouth daily.   nitroGLYCERIN 0.4 MG SL tablet Commonly known as: NITROSTAT Place 0.4 mg under the tongue every 5 (five) minutes as needed for chest pain.   Ozempic (0.25 or 0.5 MG/DOSE) 2 MG/1.5ML Sopn Generic drug: Semaglutide(0.25 or 0.5MG /DOS) Inject 0.5 mg into the skin every Monday.   pantoprazole 40 MG tablet Commonly known as: PROTONIX Take 40 mg by mouth daily. For heartburn   rosuvastatin 40 MG tablet Commonly known as: CRESTOR Take 40 mg by mouth daily.   vitamin B-12 1000 MCG tablet Commonly known as: CYANOCOBALAMIN Take 1,000 mcg by mouth daily.   Vitamin D 50 MCG (2000 UT) Caps Take 2,000 Units by mouth daily.       Follow-up Information    [facility] Schedule an appointment as soon as possible for a visit in 1 week(s).   Contact information: [address] [facility] [place] [address] [phone]              No Known Allergies  Consultations:  gi   Procedures/Studies: CT Angio  Abd/Pel W and/or Wo Contrast  Result Date: 03/17/2020 CLINICAL DATA:  Gastrointestinal hemorrhage, hematochezia EXAM: CTA ABDOMEN AND PELVIS WITHOUT AND WITH CONTRAST TECHNIQUE: Multidetector CT imaging of the abdomen and pelvis was performed using the standard protocol during bolus administration of intravenous contrast. Multiplanar reconstructed images and MIPs were obtained and reviewed to evaluate the vascular anatomy. CONTRAST:  OMNIPAQUE IOHEXOL 350 MG/ML SOLN COMPARISON:  03/05/2019, CTA 06/17/2016 FINDINGS: VASCULAR Aorta: Moderate mixed atherosclerotic plaque. No hemodynamically significant stenosis, aneurysm, or dissection. Celiac: Less than 50% stenosis at its origin. Classic anatomic configuration. Peripherally widely patent. SMA: Widely patent.  No peripheral aneurysm or fistula identified. Renals: Single right and dual left renal arteries. Right renal artery is widely patent. Superior left renal artery is widely patent. Inferior left renal artery demonstrates a roughly 50% stenosis at its origin. Renal arterial vasculature demonstrates normal contour. No peripheral aneurysm. IMA: Less than 50% stenosis at its origin. Inflow: Moderate mixed atherosclerotic plaque results in a less than 50% stenosis of the right common iliac artery proximally. No evidence of hemodynamically significant stenosis. Right internal iliac artery demonstrates near occlusion at its origin. Greater than 50% stenosis  of the left internal iliac artery at its origin. Proximal Outflow: 50% stenosis of the right profundus femoral artery at its origin. Less than 50% stenosis of the right SMA at its origin. Less than 50% stenosis of the left SMA at its origin. 60% stenosis of the left profundus femoral artery at its origin. Veins: Circumaortic left renal vein.  Otherwise unremarkable. Review of the MIP images confirms the above findings. NON-VASCULAR Lower chest: Visualized lung bases are clear. Right coronary artery stenting  has been performed. Moderate left ventricular hypertrophy and mild left ventricular dilation. Global cardiac size is within normal limits. No pericardial effusion peer Hepatobiliary: No focal liver abnormality is seen. No gallstones, gallbladder wall thickening, or biliary dilatation. Pancreas: Unremarkable Spleen: Unremarkable Adrenals/Urinary Tract: 23 mm right adrenal adenoma is stable from multiple prior examinations. Left adrenal gland is unremarkable. Kidneys are unremarkable. Bladder is unremarkable. Stomach/Bowel: Stomach and small bowel are unremarkable. Sigmoid colectomy has been performed. There is moderate pancolonic diverticulosis involving the residual colon. The large bowel is otherwise unremarkable. No evidence of obstruction or focal inflammation. No evidence of active extravasation identified on this examination. Appendix normal. No free intraperitoneal gas or fluid. Lymphatic: No pathologic adenopathy within the abdomen and pelvis. Reproductive: Prostate is unremarkable. Other: Tiny fat containing umbilical hernia.  Rectum unremarkable. Musculoskeletal: No acute bone abnormality. Osseous structures are age-appropriate. IMPRESSION: VASCULAR No active extravasation identified. No culprit lesion identified for the patient's reported gastrointestinal hemorrhage. Peripheral vascular disease with hemodynamically significant stenoses of the inferior left renal artery and profundus femoral artery origins bilaterally. NON-VASCULAR 23 mm right adrenal adenoma. Moderate left ventricular hypertrophy and mild left ventricular dilation. Moderate pancolonic diverticulosis.  Status post sigmoid colectomy. Aortic Atherosclerosis (ICD10-I70.0). Electronically Signed   By: Helyn Numbers MD   On: 03/17/2020 22:16    (Echo, Carotid, EGD, Colonoscopy, ERCP)    Subjective:  Patient sitting up eating breakfast no new complaints or concerns anxious to go home Discharge Exam: Vitals:   03/19/20 1354 03/20/20  0607  BP: (!) 157/95 (!) 143/95  Pulse: 72 74  Resp: 16   Temp: 99 F (37.2 C) 98.4 F (36.9 C)  SpO2: 100% 99%   Vitals:   03/18/20 2355 03/19/20 0600 03/19/20 1354 03/20/20 0607  BP: (!) 154/100 (!) 156/98 (!) 157/95 (!) 143/95  Pulse: 70 67 72 74  Resp: 18 19 16    Temp: 98.8 F (37.1 C) 98.8 F (37.1 C) 99 F (37.2 C) 98.4 F (36.9 C)  TempSrc: Oral Oral  Oral  SpO2: 100%  100% 99%  Weight:      Height:        General: Pt is alert, awake, not in acute distress Cardiovascular: RRR, S1/S2 +, no rubs, no gallops Respiratory: CTA bilaterally, no wheezing, no rhonchi Abdominal: Soft, NT, ND, bowel sounds + Extremities: no edema, no cyanosis    The results of significant diagnostics from this hospitalization (including imaging, microbiology, ancillary and laboratory) are listed below for reference.     Microbiology: Recent Results (from the past 240 hour(s))  Resp Panel by RT-PCR (Flu A&B, Covid) Nasopharyngeal Swab     Status: None   Collection Time: 03/17/20  9:42 PM   Specimen: Nasopharyngeal Swab; Nasopharyngeal(NP) swabs in vial transport medium  Result Value Ref Range Status   SARS Coronavirus 2 by RT PCR NEGATIVE NEGATIVE Final    Comment: (NOTE) SARS-CoV-2 target nucleic acids are NOT DETECTED.  The SARS-CoV-2 RNA is generally detectable in upper respiratory specimens during the  acute phase of infection. The lowest concentration of SARS-CoV-2 viral copies this assay can detect is 138 copies/mL. A negative result does not preclude SARS-Cov-2 infection and should not be used as the sole basis for treatment or other patient management decisions. A negative result may occur with  improper specimen collection/handling, submission of specimen other than nasopharyngeal swab, presence of viral mutation(s) within the areas targeted by this assay, and inadequate number of viral copies(<138 copies/mL). A negative result must be combined with clinical observations,  patient history, and epidemiological information. The expected result is Negative.  Fact Sheet for Patients:  BloggerCourse.com  Fact Sheet for Healthcare Providers:  SeriousBroker.it  This test is no t yet approved or cleared by the [place] FDA and  has been authorized for detection and/or diagnosis of SARS-CoV-2 by FDA under an Emergency Use Authorization (EUA). This EUA will remain  in effect (meaning this test can be used) for the duration of the COVID-19 declaration under Section 564(b)(1) of the Act, 21 U.S.C.section 360bbb-3(b)(1), unless the authorization is terminated  or revoked sooner.       Influenza A by PCR NEGATIVE NEGATIVE Final   Influenza B by PCR NEGATIVE NEGATIVE Final    Comment: (NOTE) The Xpert Xpress SARS-CoV-2/FLU/RSV plus assay is intended as an aid in the diagnosis of influenza from Nasopharyngeal swab specimens and should not be used as a sole basis for treatment. Nasal washings and aspirates are unacceptable for Xpert Xpress SARS-CoV-2/FLU/RSV testing.  Fact Sheet for Patients: BloggerCourse.com  Fact Sheet for Healthcare Providers: SeriousBroker.it  This test is not yet approved or cleared by the [place] FDA and has been authorized for detection and/or diagnosis of SARS-CoV-2 by FDA under an Emergency Use Authorization (EUA). This EUA will remain in effect (meaning this test can be used) for the duration of the COVID-19 declaration under Section 564(b)(1) of the Act, 21 U.S.C. section 360bbb-3(b)(1), unless the authorization is terminated or revoked.  Performed at [facility], [address] [place], [place] [address]      Labs: BNP (last 3 results) No results for input(s): BNP in the last 8760 hours. Basic Metabolic Panel: Recent Labs  Lab 03/17/20 2045 03/18/20 0650 03/19/20 0449  NA 139 137 136  K 3.5 3.4*  3.6  CL 104 105 100  CO2 26 24 24   GLUCOSE 151* 137* 133*  BUN 17 13 9   CREATININE 1.02 0.91 0.94  CALCIUM 9.0 8.5* 8.9  MG  --   --  1.9   Liver Function Tests: Recent Labs  Lab 03/17/20 2045 03/18/20 0650  AST 16 17  ALT 16 14  ALKPHOS 67 67  BILITOT 0.5 0.5  PROT 7.0 6.7  ALBUMIN 3.7 3.8   No results for input(s): LIPASE, AMYLASE in the last 168 hours. No results for input(s): AMMONIA in the last 168 hours. CBC: Recent Labs  Lab 03/18/20 0156 03/18/20 0650 03/18/20 1330 03/19/20 0449 03/20/20 0429  WBC 7.5 7.1 9.1 7.3 6.9  HGB 11.3* 11.5* 12.0* 12.5* 13.1  HCT 34.0* 35.2* 37.6* 38.2* 40.1  MCV 86.5 87.3 87.2 87.0 86.8  PLT 235 217 249 248 252   Cardiac Enzymes: No results for input(s): CKTOTAL, CKMB, CKMBINDEX, TROPONINI in the last 168 hours. BNP: Invalid input(s): POCBNP CBG: Recent Labs  Lab 03/19/20 1158 03/19/20 1700 03/19/20 2356 03/20/20 0606 03/20/20 0746  GLUCAP 122* 143* 138* 148* 148*   D-Dimer No results for input(s): DDIMER in the last 72 hours. Hgb A1c  Recent Labs    03/18/20 0650  HGBA1C 6.6*   Lipid Profile No results for input(s): CHOL, HDL, LDLCALC, TRIG, CHOLHDL, LDLDIRECT in the last 72 hours. Thyroid function studies No results for input(s): TSH, T4TOTAL, T3FREE, THYROIDAB in the last 72 hours.  Invalid input(s): FREET3 Anemia work up No results for input(s): VITAMINB12, FOLATE, FERRITIN, TIBC, IRON, RETICCTPCT in the last 72 hours. Urinalysis    Component Value Date/Time   COLORURINE AMBER (A) 03/05/2019 0034   APPEARANCEUR HAZY (A) 03/05/2019 0034   LABSPEC 1.015 03/05/2019 0034   PHURINE 7.0 03/05/2019 0034   GLUCOSEU >=500 (A) 03/05/2019 0034   HGBUR LARGE (A) 03/05/2019 0034   BILIRUBINUR NEGATIVE 03/05/2019 0034   KETONESUR 15 (A) 03/05/2019 0034   PROTEINUR NEGATIVE 03/05/2019 0034   UROBILINOGEN 0.2 10/23/2012 0249   NITRITE POSITIVE (A) 03/05/2019 0034   LEUKOCYTESUR TRACE (A) 03/05/2019 0034   Sepsis  Labs Invalid input(s): PROCALCITONIN,  WBC,  LACTICIDVEN Microbiology Recent Results (from the past 240 hour(s))  Resp Panel by RT-PCR (Flu A&B, Covid) Nasopharyngeal Swab     Status: None   Collection Time: 03/17/20  9:42 PM   Specimen: Nasopharyngeal Swab; Nasopharyngeal(NP) swabs in vial transport medium  Result Value Ref Range Status   SARS Coronavirus 2 by RT PCR NEGATIVE NEGATIVE Final    Comment: (NOTE) SARS-CoV-2 target nucleic acids are NOT DETECTED.  The SARS-CoV-2 RNA is generally detectable in upper respiratory specimens during the acute phase of infection. The lowest concentration of SARS-CoV-2 viral copies this assay can detect is 138 copies/mL. A negative result does not preclude SARS-Cov-2 infection and should not be used as the sole basis for treatment or other patient management decisions. A negative result may occur with  improper specimen collection/handling, submission of specimen other than nasopharyngeal swab, presence of viral mutation(s) within the areas targeted by this assay, and inadequate number of viral copies(<138 copies/mL). A negative result must be combined with clinical observations, patient history, and epidemiological information. The expected result is Negative.  Fact Sheet for Patients:  BloggerCourse.com  Fact Sheet for Healthcare Providers:  SeriousBroker.it  This test is no t yet approved or cleared by the [place] FDA and  has been authorized for detection and/or diagnosis of SARS-CoV-2 by FDA under an Emergency Use Authorization (EUA). This EUA will remain  in effect (meaning this test can be used) for the duration of the COVID-19 declaration under Section 564(b)(1) of the Act, 21 U.S.C.section 360bbb-3(b)(1), unless the authorization is terminated  or revoked sooner.       Influenza A by PCR NEGATIVE NEGATIVE Final   Influenza B by PCR NEGATIVE NEGATIVE Final    Comment:  (NOTE) The Xpert Xpress SARS-CoV-2/FLU/RSV plus assay is intended as an aid in the diagnosis of influenza from Nasopharyngeal swab specimens and should not be used as a sole basis for treatment. Nasal washings and aspirates are unacceptable for Xpert Xpress SARS-CoV-2/FLU/RSV testing.  Fact Sheet for Patients: BloggerCourse.com  Fact Sheet for Healthcare Providers: SeriousBroker.it  This test is not yet approved or cleared by the [place] FDA and has been authorized for detection and/or diagnosis of SARS-CoV-2 by FDA under an Emergency Use Authorization (EUA). This EUA will remain in effect (meaning this test can be used) for the duration of the COVID-19 declaration under Section 564(b)(1) of the Act, 21 U.S.C. section 360bbb-3(b)(1), unless the authorization is terminated or revoked.  Performed at [facility], [address] [place], [place] [address]  Time coordinating discharge: 39 minutes  SIGNED:   Alwyn Ren, MD  [facility] 03/20/2020, 11:12 AM

## 2020-08-04 ENCOUNTER — Other Ambulatory Visit: Payer: Self-pay

## 2020-08-04 ENCOUNTER — Emergency Department (HOSPITAL_BASED_OUTPATIENT_CLINIC_OR_DEPARTMENT_OTHER)
Admission: EM | Admit: 2020-08-04 | Discharge: 2020-08-04 | Disposition: A | Discharge status: Home / Self Care | Payer: No Typology Code available for payment source | Attending: Emergency Medicine | Admitting: Emergency Medicine

## 2020-08-04 ENCOUNTER — Encounter (HOSPITAL_BASED_OUTPATIENT_CLINIC_OR_DEPARTMENT_OTHER): Payer: Self-pay | Admitting: Emergency Medicine

## 2020-08-04 DIAGNOSIS — I2510 Atherosclerotic heart disease of native coronary artery without angina pectoris: Secondary | ICD-10-CM | POA: Insufficient documentation

## 2020-08-04 DIAGNOSIS — Z7984 Long term (current) use of oral hypoglycemic drugs: Secondary | ICD-10-CM | POA: Diagnosis not present

## 2020-08-04 DIAGNOSIS — Z79899 Other long term (current) drug therapy: Secondary | ICD-10-CM | POA: Insufficient documentation

## 2020-08-04 DIAGNOSIS — T783XXA Angioneurotic edema, initial encounter: Secondary | ICD-10-CM

## 2020-08-04 DIAGNOSIS — Z87891 Personal history of nicotine dependence: Secondary | ICD-10-CM | POA: Diagnosis not present

## 2020-08-04 DIAGNOSIS — I10 Essential (primary) hypertension: Secondary | ICD-10-CM | POA: Diagnosis not present

## 2020-08-04 DIAGNOSIS — E119 Type 2 diabetes mellitus without complications: Secondary | ICD-10-CM | POA: Diagnosis not present

## 2020-08-04 DIAGNOSIS — R220 Localized swelling, mass and lump, head: Secondary | ICD-10-CM | POA: Diagnosis present

## 2020-08-04 DIAGNOSIS — Z955 Presence of coronary angioplasty implant and graft: Secondary | ICD-10-CM | POA: Insufficient documentation

## 2020-08-04 MED ORDER — DIPHENHYDRAMINE HCL 50 MG/ML IJ SOLN
50.0000 mg | Freq: Once | INTRAMUSCULAR | Status: AC
  Administered 2020-08-04: 50 mg via INTRAVENOUS
  Filled 2020-08-04: qty 1

## 2020-08-04 MED ORDER — METHYLPREDNISOLONE SODIUM SUCC 125 MG IJ SOLR
125.0000 mg | Freq: Once | INTRAMUSCULAR | Status: AC
  Administered 2020-08-04: 125 mg via INTRAVENOUS
  Filled 2020-08-04: qty 2

## 2020-08-04 MED ORDER — FAMOTIDINE IN NACL 20-0.9 MG/50ML-% IV SOLN
20.0000 mg | Freq: Once | INTRAVENOUS | Status: AC
  Administered 2020-08-04: 20 mg via INTRAVENOUS
  Filled 2020-08-04: qty 50

## 2020-08-04 NOTE — Discharge Instructions (Addendum)
Please stop taking LISINOPRIL !!!!   You will need to schedule an appoint with your primary care physician in order to obtain a new blood pressure medication.

## 2020-08-04 NOTE — ED Provider Notes (Signed)
[facility] EMERGENCY DEPARTMENT Provider Note   CSN: [id number] Arrival date & time: 08/04/20  2542     History Chief Complaint  Patient presents with  . Oral Swelling    Antonio [name] is a 60 y.o. male.  60 y.o male with a PMH of GERD, Hear attack, HTN currently on lisinopril 20 mg daily presents to the ED with a chief complaint of lip swelling since 6 AM this morning.  She reports waking up to go to the bathroom, feeling a swelling sensation to the left part of his lip, states he noted himself in the mirror to have swelling to the upper and bottom lip but no involvement in his tongue.  Reports monitoring this at home, without much improvement after 5 hours of watching it decided to be seen in the emergency department.  Does report a similar episode in the past where he had swelling to his lips after an endoscopy, this was ruled out to be likely related to the anesthesia.  He did have a joint replacement about a week ago, therefore he thought that this was likely not related.  He has been on lisinopril for several years, reports compliant with medication.  He denies any shortness of breath, chest pain, prior history of angioedema.  The history is provided by the patient.       Past Medical History:  Diagnosis Date  . Coronary artery disease   . Diverticulosis   . GERD (gastroesophageal reflux disease)   . Heart attack (HCC) 2011; 2017  . Heart murmur   . Hemorrhoids   . High cholesterol   . History of blood transfusion 2011; 2017; 05/2016   "elated to diverticulitis"  . History of kidney stones   . Hyperlipidemia due to type 2 diabetes mellitus (HCC)   . Hypertension   . Lower GI bleed 2011; 2017; 05/2016  . PTSD (post-traumatic stress disorder)    PTSD  . Type II diabetes mellitus [facility])     Patient Active Problem List   Diagnosis Date Noted  . GERD (gastroesophageal reflux disease) 03/18/2020  . Rectal bleeding 03/17/2020  . History of lower GI bleeding  07/20/2016  . Atypical chest pain 07/11/2016  . Vasovagal near syncope   . History of non-ST elevation myocardial infarction (NSTEMI) 12/22/2015  . Essential hypertension   . Mixed hyperlipidemia due to type 2 diabetes mellitus (HCC)   . Type 2 diabetes mellitus without complication, without long-term current use of insulin (HCC) 10/19/2012  . Diverticulosis 10/16/2012  . Acute blood loss anemia 10/16/2012  . Coronary artery disease involving native coronary artery of native heart without angina pectoris 10/16/2012    Past Surgical History:  Procedure Laterality Date  . CARDIAC CATHETERIZATION N/A 12/23/2015   Procedure: Left Heart Cath and Coronary Angiography;  Surgeon: Peter M Swaziland, MD;  Location: [facility] INVASIVE CV LAB;  Service: Cardiovascular;  Laterality: N/A;  . CARDIAC CATHETERIZATION N/A 12/23/2015   Procedure: Coronary Stent Intervention;  Surgeon: Peter M Swaziland, MD;  Location: [facility] INVASIVE CV LAB;  Service: Cardiovascular;  Laterality: N/A;  . COLON RESECTION  2011   12 inches taken out in 2011 at [facility] for diverticulosis  . COLON SURGERY    . COLONOSCOPY N/A 10/18/2012   Procedure: COLONOSCOPY;  Surgeon: Louis Meckel, MD;  Location: [facility] ENDOSCOPY;  Service: Endoscopy;  Laterality: N/A;  . COLONOSCOPY N/A 10/22/2012   Procedure: COLONOSCOPY;  Surgeon: Iva Boop, MD;  Location: [facility] ENDOSCOPY;  Service:  Endoscopy;  Laterality: N/A;  . CORONARY ANGIOPLASTY WITH STENT PLACEMENT  2011   at [facility], 2 stents, locations unknown  . CYSTOSCOPY/URETEROSCOPY/HOLMIUM LASER/STENT PLACEMENT Right 03/23/2019   Procedure: CYSTOSCOPY/URETEROSCOPY/HOLMIUM LASER/STENT PLACEMENT;  Surgeon: Heloise Purpura, MD;  Location: [facility];  Service: Urology;  Laterality: Right;  . ESOPHAGOGASTRODUODENOSCOPY N/A 10/18/2012   Procedure: ESOPHAGOGASTRODUODENOSCOPY (EGD);  Surgeon: Louis Meckel, MD;  Location: [facility] ENDOSCOPY;  Service: Endoscopy;  Laterality: N/A;   . INGUINAL HERNIA REPAIR Right   . IRRIGATION AND DEBRIDEMENT ABSCESS N/A 10/26/2012   Procedure: IRRIGATION AND DEBRIDEMENT PERINEAL ABSCESS;  Surgeon: Wilmon Arms. Corliss Skains, MD;  Location: [facility] OR;  Service: General;  Laterality: N/A;  . JOINT REPLACEMENT         Family History  Problem Relation Age of Onset  . Alzheimer's disease Mother 14  . Heart attack Father 79  . CAD Brother 40       triple bypass    Social History   Tobacco Use  . Smoking status: Former Smoker    Packs/day: 0.50    Years: 20.00    Pack years: 10.00    Types: Cigarettes    Quit date: 10/19/2002    Years since quitting: 17.8  . Smokeless tobacco: Never Used  Vaping Use  . Vaping Use: Never used  Substance Use Topics  . Alcohol use: Yes    Comment: occ  . Drug use: Yes    Types: Marijuana    Home Medications Prior to Admission medications   Medication Sig Start Date End Date Taking? Authorizing Provider  amLODipine (NORVASC) 10 MG tablet Take 10 mg by mouth daily.    [provider]  Cholecalciferol (VITAMIN D) 50 MCG (2000 UT) CAPS Take 2,000 Units by mouth daily.    [provider]  empagliflozin (JARDIANCE) 25 MG TABS tablet Take 12.5 mg by mouth daily.    [provider]  lisinopril (ZESTRIL) 40 MG tablet Take 40 mg by mouth daily.    [provider]  metFORMIN (GLUCOPHAGE-XR) 500 MG 24 hr tablet Take 1,000 mg by mouth daily. 03/06/20   [provider]  metoprolol (TOPROL-XL) 200 MG 24 hr tablet Take 200 mg by mouth daily.    [provider]  nitroGLYCERIN (NITROSTAT) 0.4 MG SL tablet Place 0.4 mg under the tongue every 5 (five) minutes as needed for chest pain.    [provider]  pantoprazole (PROTONIX) 40 MG tablet Take 40 mg by mouth daily. For heartburn    [provider]  rosuvastatin (CRESTOR) 40 MG tablet Take 40 mg by mouth daily.    [provider]  Semaglutide,0.25 or 0.5MG /DOS, (OZEMPIC, 0.25 OR 0.5 MG/DOSE,)  2 MG/1.5ML SOPN Inject 0.5 mg into the skin every Monday.    [provider]  vitamin B-12 (CYANOCOBALAMIN) 1000 MCG tablet Take 1,000 mcg by mouth daily.    [provider]    Allergies    Patient has no known allergies.  Review of Systems   Review of Systems  Constitutional: Negative for fever.  HENT: Positive for facial swelling. Negative for sore throat.   Respiratory: Negative for shortness of breath.   Cardiovascular: Negative for chest pain.  Gastrointestinal: Negative for abdominal pain, nausea and vomiting.  Musculoskeletal: Negative for back pain.  Neurological: Negative for light-headedness and headaches.  All other systems reviewed and are negative.   Physical Exam Updated Vital Signs BP (!) 154/89   Pulse 83   Temp 98.6 F (  37 C) (Oral)   Resp 18   Ht 5\' 7"  (1.702 m)   Wt 79.8 kg   SpO2 99%   BMI 27.57 kg/m   Physical Exam Vitals and nursing note reviewed.  Constitutional:      Appearance: Normal appearance.  HENT:     Head: Normocephalic and atraumatic.     Nose: Nose normal.     Mouth/Throat:     Lips: No lesions.     Mouth: Mucous membranes are dry. Angioedema present.     Comments: Oropharynx appears clear without any erythema, no tonsillar swelling or exudate.  Please see photos attached. Eyes:     Pupils: Pupils are equal, round, and reactive to light.  Cardiovascular:     Rate and Rhythm: Normal rate.  Pulmonary:     Effort: Pulmonary effort is normal.  Abdominal:     General: Abdomen is flat.     Palpations: Abdomen is soft.     Tenderness: There is no abdominal tenderness.  Musculoskeletal:        General: No tenderness.     Cervical back: Normal range of motion and neck supple.  Skin:    General: Skin is warm and dry.  Neurological:     Mental Status: He is alert and oriented to person, place, and time.     ED Results / Procedures / Treatments   Labs (all labs ordered are listed, but only abnormal results are  displayed) Labs Reviewed - No data to display  EKG None  Radiology No results found.  Procedures Procedures   Medications Ordered in ED Medications  methylPREDNISolone sodium succinate (SOLU-MEDROL) 125 mg/2 mL injection 125 mg (125 mg Intravenous Given 08/04/20 1135)  famotidine (PEPCID) IVPB 20 mg premix (0 mg Intravenous Stopped 08/04/20 1230)  diphenhydrAMINE (BENADRYL) injection 50 mg (50 mg Intravenous Given 08/04/20 1132)    ED Course  I have reviewed the triage vital signs and the nursing notes.  Pertinent labs & imaging results that were available during my care of the patient were reviewed by me and considered in my medical decision making (see chart for details).    MDM Rules/Calculators/A&P     Patient presents to the ED with 5 hours of swelling to his left upper and lower lip.  Currently on lisinopril 20 mg daily and has been for several years.  No prior history of angio edema.  Denies any chest pain, shortness of breath, difficulty tolerating his secretions.  Does monitor this at home for the past 5 hours without much improvement, according to wife she feels that this is likely getting worse.  Prior episode noted after an endoscopy and anesthesia.  However, patient had a total joint performed over a week ago, therefore they did not believe this was likely related.  Patient was given Benadryl, Pepcid, Solu-Medrol in order to help with further swelling and likely angioedema.  I discussed case with my attending Dr. Criss Alvine who has evaluate patient and agrees with treatment and plan.   Patient has been monitored in the ED for the past 2 hours and 45 minutes, during evaluation swelling has not worsened.  He continues to endorse no shortness of breath, no difficulty tolerating his secretions.  No other complaints.  1:00 PM patient was reevaluated by me, swelling to the left lip seems to be improving.  We discussed further monitoring while in the ED.  Will be rechecked in 1  hour.  Patient has been observed in the ED for 4:35 minutes,  without any further worsening symptoms. Upon reevaluation, symptoms have actually improved.  We discussed discontinuing lisinopril at this time.  Aware he will need to schedule an appointment with his PCP in order to have a change in blood pressure medication, he is agreeable to this. Patient care discussed with my attending Dr. Criss Alvine who has evaluated the patient and agrees with plan and management.   Portions of this note were generated with Scientist, clinical (histocompatibility and immunogenetics). Dictation errors may occur despite best attempts at proofreading.  Final Clinical Impression(s) / ED Diagnoses Final diagnoses:  Angioedema of lips, initial encounter    Rx / DC Orders ED Discharge Orders    None       Claude Manges, PA-C 08/04/20 1420    Pricilla Loveless, MD 08/04/20 1505

## 2020-08-04 NOTE — ED Triage Notes (Signed)
Pt noticed LT side upper and lower lip swelling when he woke up this morning; denies pain

## 2022-06-07 ENCOUNTER — Emergency Department (HOSPITAL_BASED_OUTPATIENT_CLINIC_OR_DEPARTMENT_OTHER)
Admission: EM | Admit: 2022-06-07 | Discharge: 2022-06-07 | Disposition: A | Discharge status: Home / Self Care | Payer: No Typology Code available for payment source | Attending: Emergency Medicine | Admitting: Emergency Medicine

## 2022-06-07 ENCOUNTER — Other Ambulatory Visit: Payer: Self-pay

## 2022-06-07 ENCOUNTER — Encounter (HOSPITAL_BASED_OUTPATIENT_CLINIC_OR_DEPARTMENT_OTHER): Payer: Self-pay | Admitting: Pediatrics

## 2022-06-07 ENCOUNTER — Emergency Department (HOSPITAL_BASED_OUTPATIENT_CLINIC_OR_DEPARTMENT_OTHER): Payer: No Typology Code available for payment source

## 2022-06-07 DIAGNOSIS — R072 Precordial pain: Secondary | ICD-10-CM | POA: Diagnosis not present

## 2022-06-07 DIAGNOSIS — N3000 Acute cystitis without hematuria: Secondary | ICD-10-CM | POA: Diagnosis not present

## 2022-06-07 DIAGNOSIS — R531 Weakness: Secondary | ICD-10-CM | POA: Diagnosis present

## 2022-06-07 DIAGNOSIS — Z79899 Other long term (current) drug therapy: Secondary | ICD-10-CM | POA: Insufficient documentation

## 2022-06-07 DIAGNOSIS — E871 Hypo-osmolality and hyponatremia: Secondary | ICD-10-CM | POA: Insufficient documentation

## 2022-06-07 DIAGNOSIS — I10 Essential (primary) hypertension: Secondary | ICD-10-CM | POA: Insufficient documentation

## 2022-06-07 DIAGNOSIS — E119 Type 2 diabetes mellitus without complications: Secondary | ICD-10-CM | POA: Diagnosis not present

## 2022-06-07 DIAGNOSIS — Z7984 Long term (current) use of oral hypoglycemic drugs: Secondary | ICD-10-CM | POA: Insufficient documentation

## 2022-06-07 DIAGNOSIS — E876 Hypokalemia: Secondary | ICD-10-CM | POA: Insufficient documentation

## 2022-06-07 DIAGNOSIS — I2510 Atherosclerotic heart disease of native coronary artery without angina pectoris: Secondary | ICD-10-CM | POA: Insufficient documentation

## 2022-06-07 DIAGNOSIS — R42 Dizziness and giddiness: Secondary | ICD-10-CM | POA: Insufficient documentation

## 2022-06-07 DIAGNOSIS — Z1152 Encounter for screening for COVID-19: Secondary | ICD-10-CM | POA: Insufficient documentation

## 2022-06-07 LAB — HEPATIC FUNCTION PANEL
ALT: 12 U/L (ref 0–44)
AST: 22 U/L (ref 15–41)
Albumin: 3.7 g/dL (ref 3.5–5.0)
Alkaline Phosphatase: 68 U/L (ref 38–126)
Bilirubin, Direct: 0.2 mg/dL (ref 0.0–0.2)
Indirect Bilirubin: 1.2 mg/dL — ABNORMAL HIGH (ref 0.3–0.9)
Total Bilirubin: 1.4 mg/dL — ABNORMAL HIGH (ref 0.3–1.2)
Total Protein: 7.9 g/dL (ref 6.5–8.1)

## 2022-06-07 LAB — URINALYSIS, ROUTINE W REFLEX MICROSCOPIC
Bilirubin Urine: NEGATIVE
Glucose, UA: >=500 mg/dL — AB
Ketones, ur: 15 mg/dL — AB
Leukocytes,Ua: NEGATIVE
Nitrite: POSITIVE — AB
Protein, ur: 30 mg/dL — AB
Specific Gravity, Urine: 1.02 (ref 1.005–1.030)
pH: 6.5 (ref 5.0–8.0)

## 2022-06-07 LAB — BASIC METABOLIC PANEL
Anion gap: 11 (ref 5–15)
BUN: 17 mg/dL (ref 8–23)
CO2: 25 mmol/L (ref 22–32)
Calcium: 9.3 mg/dL (ref 8.9–10.3)
Chloride: 97 mmol/L — ABNORMAL LOW (ref 98–111)
Creatinine, Ser: 1.01 mg/dL (ref 0.61–1.24)
GFR, Estimated: >60 mL/min (ref >60)
Glucose, Bld: 319 mg/dL — ABNORMAL HIGH (ref 70–99)
Potassium: 3.1 mmol/L — ABNORMAL LOW (ref 3.5–5.1)
Sodium: 133 mmol/L — ABNORMAL LOW (ref 135–145)

## 2022-06-07 LAB — RESP PANEL BY RT-PCR (RSV, FLU A&B, COVID)  RVPGX2
Influenza A by PCR: NEGATIVE
Influenza B by PCR: NEGATIVE
Resp Syncytial Virus by PCR: NEGATIVE
SARS Coronavirus 2 by RT PCR: NEGATIVE

## 2022-06-07 LAB — CBC
HCT: 42.5 % (ref 39.0–52.0)
Hemoglobin: 14.5 g/dL (ref 13.0–17.0)
MCH: 28.3 pg (ref 26.0–34.0)
MCHC: 34.1 g/dL (ref 30.0–36.0)
MCV: 82.8 fL (ref 80.0–100.0)
Platelets: 246 10*3/uL (ref 150–400)
RBC: 5.13 MIL/uL (ref 4.22–5.81)
RDW: 13.7 % (ref 11.5–15.5)
WBC: 6.7 10*3/uL (ref 4.0–10.5)
nRBC: 0 % (ref 0.0–0.2)

## 2022-06-07 LAB — URINALYSIS, MICROSCOPIC (REFLEX)

## 2022-06-07 LAB — TROPONIN I (HIGH SENSITIVITY)
Troponin I (High Sensitivity): 9 ng/L (ref <18)
Troponin I (High Sensitivity): 9 ng/L (ref <18)

## 2022-06-07 LAB — CBG MONITORING, ED: Glucose-Capillary: 300 mg/dL — ABNORMAL HIGH (ref 70–99)

## 2022-06-07 MED ORDER — INSULIN ASPART PROT & ASPART (70-30 MIX) 100 UNIT/ML ~~LOC~~ SUSP: 5.0000 [IU] | Freq: Once | SUBCUTANEOUS | Status: DC

## 2022-06-07 MED ORDER — CEPHALEXIN 500 MG PO CAPS: 500.0000 mg | ORAL_CAPSULE | Freq: Four times a day (QID) | ORAL | 0 refills | Status: DC

## 2022-06-07 MED ORDER — CEPHALEXIN 500 MG PO CAPS: 500.0000 mg | ORAL_CAPSULE | Freq: Four times a day (QID) | ORAL | 0 refills | Status: AC

## 2022-06-07 MED ORDER — CEPHALEXIN 500 MG PO CAPS: 500.0000 mg | ORAL_CAPSULE | Freq: Three times a day (TID) | ORAL | 0 refills | Status: DC

## 2022-06-07 MED ORDER — POTASSIUM CHLORIDE CRYS ER 20 MEQ PO TBCR
40.0000 meq | EXTENDED_RELEASE_TABLET | Freq: Once | ORAL | Status: AC
  Administered 2022-06-07: 40 meq via ORAL
  Filled 2022-06-07: qty 2

## 2022-06-07 MED ORDER — INSULIN ASPART 100 UNIT/ML IJ SOLN
5.0000 [IU] | Freq: Once | INTRAMUSCULAR | Status: AC
  Administered 2022-06-07: 5 [IU] via SUBCUTANEOUS

## 2022-06-07 MED ORDER — SODIUM CHLORIDE 0.9 % IV SOLN
1.0000 g | Freq: Once | INTRAVENOUS | Status: AC
  Administered 2022-06-07: 1 g via INTRAVENOUS
  Filled 2022-06-07: qty 10

## 2022-06-07 NOTE — ED Notes (Signed)
Pt in bed, sig other at bedside, states that they just got back from vacation and pt has been having some chest pain, states that they were seen in [place] and told it was gerd, pt states that he is here for continued chest pain and dizziness.

## 2022-06-07 NOTE — ED Notes (Signed)
Pt in bed, questions answered by md, pt states that he is ready to go home, pt requests paper prescription, script given, pt verbalized understanding d/c and follow up, pt from dpt with sig other.

## 2022-06-07 NOTE — ED Notes (Signed)
Pt in bed, soup and water given with crackers for po challenge

## 2022-06-07 NOTE — Discharge Instructions (Addendum)
We saw you in the ER for chest pain, weakness, sweats. Cardiac workup is normal.  However, you do have signs of urinary infection which would explain many of your symptoms. We are not sure what is causing your chest pain, sweats.  We recommend that you follow-up with your primary care doctor for the sweats and weakness, especially if they do not get better with antibiotics. For your chest pain, please follow-up with the cardiology service.  Please return to the ER if you have worsening chest pain, shortness of breath, pain radiating to your jaw, shoulder, or back, sweats or fainting. Otherwise see the Cardiologist or your primary care doctor as requested.

## 2022-06-07 NOTE — ED Provider Notes (Signed)
[facility]ERGENCY DEPARTMENT [facility] Provider Note   CSN: [id number] Arrival date & time: 06/07/22  1004     History  Chief Complaint  Patient presents with   Dizziness    Antonio [name] is a 62 y.o. male.  HPI    62 year old male comes in with chief complaint of dizziness.  Patient has history of diabetes, CAD, hyperlipidemia, GERD, hypertension.  Patient accompanied by wife, who provides substantial part of the history.  According the patient, last Friday they went to [place]  He was feeling fine when they initially arrived.  On Sunday, he started having generalized weakness and did not participate in many activities.  He also started having episodes of chest pain described as pressure-like feeling, burning-like feeling and it was midsternal.  Pain was nonradiating.  Pain was not directly provoked by any activity.  When patient got off of the flight in [place], he looked pale, was complaining of chest pain therefore the wife took him to emergency room in [place].  Patient had troponins which were reassuring and he was discharged.  This morning, patient had sudden onset of feeling jittery, dizzy.  He continues to have reduced p.o. intake.  Patient has also had some sweating this morning and last night.  Patient thinks he has had night sweats recently as well.  He denies any weight loss.  There is no known cancer disease history.  Wife thinks patient had a low-grade fever when they first arrived to the ER, but patient has had no high-grade temperature since.  Patient has history of CAD.  He states that when he had MI, he had GERD like symptoms.  Patient denies any near fainting or fainting spell.  Home Medications Prior to Admission medications   Medication Sig Start Date End Date Taking? Authorizing Provider  cephALEXin (KEFLEX) 500 MG capsule Take 1 capsule (500 mg total) by mouth 3 (three) times daily. 06/07/22  Yes Breionna Punt, MD   amLODipine (NORVASC) 10 MG tablet Take 10 mg by mouth daily.    [provider]  Cholecalciferol (VITAMIN D) 50 MCG (2000 UT) CAPS Take 2,000 Units by mouth daily.    [provider]  empagliflozin (JARDIANCE) 25 MG TABS tablet Take 12.5 mg by mouth daily.    [provider]  lisinopril (ZESTRIL) 40 MG tablet Take 40 mg by mouth daily.    [provider]  metFORMIN (GLUCOPHAGE-XR) 500 MG 24 hr tablet Take 1,000 mg by mouth daily. 03/06/20   [provider]  metoprolol (TOPROL-XL) 200 MG 24 hr tablet Take 200 mg by mouth daily.    [provider]  nitroGLYCERIN (NITROSTAT) 0.4 MG SL tablet Place 0.4 mg under the tongue every 5 (five) minutes as needed for chest pain.    [provider]  pantoprazole (PROTONIX) 40 MG tablet Take 40 mg by mouth daily. For heartburn    [provider]  rosuvastatin (CRESTOR) 40 MG tablet Take 40 mg by mouth daily.    [provider]  Semaglutide,0.25 or 0.'5MG'$ /DOS, (OZEMPIC, 0.25 OR 0.5 MG/DOSE,) 2 MG/1.5ML SOPN Inject 0.5 mg into the skin every Monday.    [provider]  vitamin B-12 (CYANOCOBALAMIN) 1000 MCG tablet Take 1,000 mcg by mouth daily.    [provider]      Allergies    Lisinopril    Review of Systems   Review of Systems  All other systems reviewed and are negative.   Physical Exam Updated  Vital Signs BP (!) 159/80 (BP Location: Right Arm)   Pulse 73   Temp 98.5 F (36.9 C) (Oral)   Resp 18   SpO2 100%  Physical Exam Vitals and nursing note reviewed.  Constitutional:      Appearance: He is well-developed.  HENT:     Head: Atraumatic.  Eyes:     Extraocular Movements: Extraocular movements intact.     Pupils: Pupils are equal, round, and reactive to light.  Cardiovascular:     Rate and Rhythm: Normal rate.  Pulmonary:     Effort: Pulmonary effort is normal.  Musculoskeletal:     Cervical back: Neck supple.  Skin:    General: Skin  is warm.  Neurological:     Mental Status: He is alert and oriented to person, place, and time.     Cranial Nerves: No cranial nerve deficit.     Sensory: No sensory deficit.     Motor: No weakness.     Coordination: Coordination normal.     ED Results / Procedures / Treatments   Labs (all labs ordered are listed, but only abnormal results are displayed) Labs Reviewed  BASIC METABOLIC PANEL - Abnormal; Notable for the following components:      Result Value   Sodium 133 (*)    Potassium 3.1 (*)    Chloride 97 (*)    Glucose, Bld 319 (*)    All other components within normal limits  URINALYSIS, ROUTINE W REFLEX MICROSCOPIC - Abnormal; Notable for the following components:   Glucose, UA >=500 (*)    Hgb urine dipstick TRACE (*)    Ketones, ur 15 (*)    Protein, ur 30 (*)    Nitrite POSITIVE (*)    All other components within normal limits  URINALYSIS, MICROSCOPIC (REFLEX) - Abnormal; Notable for the following components:   Bacteria, UA MANY (*)    All other components within normal limits  HEPATIC FUNCTION PANEL - Abnormal; Notable for the following components:   Total Bilirubin 1.4 (*)    Indirect Bilirubin 1.2 (*)    All other components within normal limits  CBG MONITORING, ED - Abnormal; Notable for the following components:   Glucose-Capillary 300 (*)    All other components within normal limits  RESP PANEL BY RT-PCR (RSV, FLU A&B, COVID)  RVPGX2  CBC  CBG MONITORING, ED  TROPONIN I (HIGH SENSITIVITY)  TROPONIN I (HIGH SENSITIVITY)    EKG EKG Interpretation  Date/Time:  Thursday June 07 2022 10:20:22 EST Ventricular Rate:  68 PR Interval:  157 QRS Duration: 89 QT Interval:  440 QTC Calculation: 468 R Axis:   113 Text Interpretation: Sinus rhythm Atrial premature complexes Right axis deviation Repol abnrm suggests ischemia, anterolateral inferior and lateral TWI are new No acute changes Confirmed by Varney Biles [id number]) on 06/07/2022 11:49:35  AM  Radiology DG Chest 2 View  Result Date: 06/07/2022 CLINICAL DATA:  precordial chest pain EXAM: CHEST - 2 VIEW COMPARISON:  07/11/2016. FINDINGS: The heart size and mediastinal contours are within normal limits. Both lungs are clear. No pneumothorax or pleural effusion. There are thoracic degenerative changes. IMPRESSION: No active cardiopulmonary disease. Electronically Signed   By: Sammie Bench M.D.   On: 06/07/2022 13:03    Procedures Procedures    Medications Ordered in ED Medications  potassium chloride SA (KLOR-CON M) CR tablet 40 mEq (40 mEq Oral Given 06/07/22 1409)  cefTRIAXone (ROCEPHIN) 1 g in sodium chloride 0.9 % 100 mL IVPB (0 g  Intravenous Stopped 06/07/22 1445)    ED Course/ Medical Decision Making/ A&P                             Medical Decision Making Amount and/or Complexity of Data Reviewed Labs: ordered. Radiology: ordered.  Risk Prescription drug management.   This patient presents to the ED with chief complaint(s) of weakness, dizziness, reduced oral intake and sweats with pertinent past medical history of diabetes, CAD, hypertension, hyperlipidemia.The complaint involves an extensive differential diagnosis and also carries with it a high risk of complications and morbidity.      The differential diagnosis includes : Acute coronary syndrome, stroke, severe electrolyte abnormality, COVID-19/flu, UTI, renal failure. Other possibilities considered includes infectious conditions from recent travel including [place], dengue fever, malaria.  The initial plan is to get basic troponins.  Will also get basic labs including LFTs and urine analysis. EKG shows no acute changes.  Additional history obtained: Additional history obtained from spouse Records reviewed Care Everywhere/External Records, and I reviewed records from ED visit from few days back.  Patient had normal high-sensitivity troponin at that time.  Independent labs interpretation:  The  following labs were independently interpreted: UA is nitrite positive with pyuria.  Patient states that he does not have any burning with urination, but he is having some urinary frequency.  He has no history of UTI.  No recent instrumentation.  Ceftriaxone ordered.  That could explain some of his symptoms such as malaise, nausea, sweats.  Patient has mild hypokalemia, mild hyponatremia and has no anemia or severe leukocytosis.  Independent visualization and interpretation of imaging: - I independently visualized the following imaging with scope of interpretation limited to determining acute life threatening conditions related to emergency care: X-ray of the chest, which revealed negative for any acute process such as pneumothorax.  Treatment and Reassessment: The patient appears reasonably screened and/or stabilized for discharge and I doubt any other medical condition or other [facility] requiring further screening, evaluation, or treatment in the ED at this time prior to discharge.   Results from the ER workup discussed with the patient face to face and all questions answered to the best of my ability. The patient is safe for discharge with strict return precautions.    Final Clinical Impression(s) / ED Diagnoses Final diagnoses:  Acute cystitis without hematuria  Precordial chest pain    Rx / DC Orders ED Discharge Orders          Ordered    Ambulatory referral to Cardiology       Comments: If you have not heard from the Cardiology office within the next 72 hours please call [phone].   06/07/22 1541    cephALEXin (KEFLEX) 500 MG capsule  3 times daily        06/07/22 1542              Varney Biles, MD 06/07/22 1543

## 2022-06-07 NOTE — ED Triage Notes (Signed)
C/O "dizziness and jitteriness" upon waking this morning. Reported patient was evaluated at the hospital a few days ago for dizziness and chest pain and was diagnose with GERD. Wife at bedside reports patient's been feeling unwell since then with decreased PO intake and activity, and has not eaten this morning. Wife reports profuse sweating this morning and last night. Denies pain when asked.

## 2022-06-11 DIAGNOSIS — R011 Cardiac murmur, unspecified: Secondary | ICD-10-CM | POA: Insufficient documentation

## 2022-06-11 DIAGNOSIS — I219 Acute myocardial infarction, unspecified: Secondary | ICD-10-CM | POA: Insufficient documentation

## 2022-06-11 DIAGNOSIS — E7800 Pure hypercholesterolemia, unspecified: Secondary | ICD-10-CM | POA: Insufficient documentation

## 2022-06-11 DIAGNOSIS — K649 Unspecified hemorrhoids: Secondary | ICD-10-CM | POA: Insufficient documentation

## 2022-06-11 DIAGNOSIS — Z9289 Personal history of other medical treatment: Secondary | ICD-10-CM | POA: Insufficient documentation

## 2022-06-11 DIAGNOSIS — F4310 Post-traumatic stress disorder, unspecified: Secondary | ICD-10-CM | POA: Insufficient documentation

## 2022-06-11 DIAGNOSIS — Z87442 Personal history of urinary calculi: Secondary | ICD-10-CM | POA: Insufficient documentation

## 2022-06-12 ENCOUNTER — Ambulatory Visit: Payer: No Typology Code available for payment source | Attending: Cardiology | Admitting: Cardiology

## 2022-06-12 ENCOUNTER — Encounter: Payer: Self-pay | Admitting: Cardiology

## 2022-06-12 VITALS — BP 130/72 | HR 68 | Ht 67.0 in | Wt 181.0 lb

## 2022-06-12 DIAGNOSIS — Z8719 Personal history of other diseases of the digestive system: Secondary | ICD-10-CM

## 2022-06-12 DIAGNOSIS — E119 Type 2 diabetes mellitus without complications: Secondary | ICD-10-CM

## 2022-06-12 DIAGNOSIS — R0609 Other forms of dyspnea: Secondary | ICD-10-CM

## 2022-06-12 DIAGNOSIS — I10 Essential (primary) hypertension: Secondary | ICD-10-CM

## 2022-06-12 DIAGNOSIS — R079 Chest pain, unspecified: Secondary | ICD-10-CM

## 2022-06-12 DIAGNOSIS — E1169 Type 2 diabetes mellitus with other specified complication: Secondary | ICD-10-CM

## 2022-06-12 DIAGNOSIS — E782 Mixed hyperlipidemia: Secondary | ICD-10-CM

## 2022-06-12 MED ORDER — ROSUVASTATIN CALCIUM 20 MG PO TABS: 20.0000 mg | ORAL_TABLET | Freq: Every day | ORAL | 3 refills | Status: AC

## 2022-06-12 MED ORDER — NITROGLYCERIN 0.4 MG SL SUBL: 0.4000 mg | SUBLINGUAL_TABLET | SUBLINGUAL | 11 refills | Status: DC | PRN

## 2022-06-12 MED ORDER — NITROGLYCERIN 0.4 MG SL SUBL: 0.4000 mg | SUBLINGUAL_TABLET | SUBLINGUAL | 11 refills | Status: AC | PRN

## 2022-06-12 MED ORDER — ROSUVASTATIN CALCIUM 20 MG PO TABS: 20.0000 mg | ORAL_TABLET | Freq: Every day | ORAL | 3 refills | Status: DC

## 2022-06-12 NOTE — Progress Notes (Signed)
Cardiology Consultation:    Date:  06/12/2022   ID:  Antonio [name], DOB Feb 08, 1961, MRN [id number]  PCP:  [facility]  Cardiologist:  Jenne Campus, MD   Referring MD: Varney Biles, MD   No chief complaint on file. Now doing well  History of Present Illness:    Antonio [name] is a 62 y.o. male who is being seen today for the evaluation of chest pain at the request of Varney Biles, MD. past medical history significant for coronary artery disease he does have PTCA and stenting down to diagonal obtuse marginal 1 and RCA last intervention was in September 2017 in face of non-STEMI with stenting to right coronary artery he does also have some history of remote GI bleed, essential hypertension, dyslipidemia, mildly reduced left ventricular ejection fraction last assessment 2019 of 45%.  He disappeared from follow-up since 2018 recently he ended up going for vacation to [place]  When he was there he started getting sick he developed urinary tract infection as well as some chest pain.  When he returned to the [place] he went to emergency room in [place] and he was find to have GERD as well as UTI appropriate medications have been given he is doing much better he comes today to follow-up.  He denies have any chest pain tightness squeezing pressure burning chest.  He does not exercise on the regular basis.  He is coming to my office with his wife who participated in the decision making and she tells me that he sits majority of time.  Denies have any chest pain tightness squeezing pressure manage except for those episode that brought him to the emergency room.  He does not smoke cigarettes but he smokes a pack.  Past Medical History:  Diagnosis Date   Coronary artery disease    Diverticulosis    GERD (gastroesophageal reflux disease)    Heart attack ([place]) 2011; 2017   Heart murmur    Hemorrhoids    High cholesterol    History of blood transfusion  2011; 2017; 05/2016   "elated to diverticulitis"   History of kidney stones    Hyperlipidemia due to type 2 diabetes mellitus ([place])    Hypertension    Lower GI bleed 2011; 2017; 05/2016   PTSD (post-traumatic stress disorder)    PTSD   Type II diabetes mellitus ([place])     Past Surgical History:  Procedure Laterality Date   CARDIAC CATHETERIZATION N/A 12/23/2015   Procedure: Left Heart Cath and Coronary Angiography;  Surgeon: Peter M Martinique, MD;  Location: [place] [facility];  Service: Cardiovascular;  Laterality: N/A;   CARDIAC CATHETERIZATION N/A 12/23/2015   Procedure: Coronary Stent Intervention;  Surgeon: Peter M Martinique, MD;  Location: [place] [facility];  Service: Cardiovascular;  Laterality: N/A;   COLON RESECTION  2011   12 inches taken out in 2011 at [facility] for diverticulosis   COLON SURGERY     COLONOSCOPY N/A 10/18/2012   Procedure: COLONOSCOPY;  Surgeon: Inda Castle, MD;  Location: [place];  Service: Endoscopy;  Laterality: N/A;   COLONOSCOPY N/A 10/22/2012   Procedure: COLONOSCOPY;  Surgeon: Gatha Mayer, MD;  Location: [place];  Service: Endoscopy;  Laterality: N/A;   CORONARY ANGIOPLASTY WITH STENT PLACEMENT  2011   at [facility], 2 stents, locations unknown   CYSTOSCOPY/URETEROSCOPY/HOLMIUM LASER/STENT PLACEMENT Right 03/23/2019   Procedure: CYSTOSCOPY/URETEROSCOPY/HOLMIUM LASER/STENT PLACEMENT;  Surgeon: Raynelle Bring, MD;  Location: [facility];  Service: Urology;  Laterality: Right;   ESOPHAGOGASTRODUODENOSCOPY N/A 10/18/2012   Procedure: ESOPHAGOGASTRODUODENOSCOPY (EGD);  Surgeon: Inda Castle, MD;  Location: [place];  Service: Endoscopy;  Laterality: N/A;   INGUINAL HERNIA REPAIR Right    IRRIGATION AND DEBRIDEMENT ABSCESS N/A 10/26/2012   Procedure: IRRIGATION AND DEBRIDEMENT PERINEAL ABSCESS;  Surgeon: Imogene Burn. Tsuei, MD;  Location: [place] [facility];  Service: General;  Laterality: N/A;   JOINT REPLACEMENT       Current Medications: Current Meds  Medication Sig   amLODipine (NORVASC) 10 MG tablet Take 10 mg by mouth daily.   cephALEXin (KEFLEX) 500 MG capsule Take 1 capsule (500 mg total) by mouth 4 (four) times daily.   metFORMIN (GLUCOPHAGE-XR) 500 MG 24 hr tablet Take 1,000 mg by mouth daily.   metoprolol (TOPROL-XL) 200 MG 24 hr tablet Take 200 mg by mouth daily.   nitroGLYCERIN (NITROSTAT) 0.4 MG SL tablet Place 0.4 mg under the tongue every 5 (five) minutes as needed for chest pain.   pantoprazole (PROTONIX) 40 MG tablet Take 40 mg by mouth daily. For heartburn   rosuvastatin (CRESTOR) 40 MG tablet Take 40 mg by mouth daily.   Semaglutide,0.25 or 0.'5MG'$ /DOS, (OZEMPIC, 0.25 OR 0.5 MG/DOSE,) 2 MG/1.5ML SOPN Inject 0.5 mg into the skin every Monday.     Allergies:   Lisinopril   Social History   Socioeconomic History   Marital status: Married    Spouse name: Not on file   Number of children: Not on file   Years of education: Not on file   Highest education level: Not on file  Occupational History   Occupation: Retired Corporate treasurer  Tobacco Use   Smoking status: Former    Packs/day: 0.50    Years: 20.00    Total pack years: 10.00    Types: Cigarettes    Quit date: 10/19/2002    Years since quitting: 19.6   Smokeless tobacco: Never  Vaping Use   Vaping Use: Never used  Substance and Sexual Activity   Alcohol use: Yes    Comment: occ   Drug use: Yes    Types: Marijuana   Sexual activity: Not on file  Other Topics Concern   Not on file  Social History Narrative   Lives with wife in [place].   Social Determinants of Health   Financial Resource Strain: Not on file  Food Insecurity: Not on file  Transportation Needs: Not on file  Physical Activity: Not on file  Stress: Not on file  Social Connections: Not on file     Family History: The patient's family history includes Alzheimer's disease (age of onset: 79) in his mother; CAD (age of onset: 61) in his brother; Heart  attack (age of onset: 50) in his father. ROS:   Please see the history of present illness.    All 14 point review of systems negative except as described per history of present illness.  EKGs/Labs/Other Studies Reviewed:    The following studies were reviewed today: I did review record from [place]  EKG:  EKG is  ordered today.  The ekg ordered today demonstrates normal sinus rhythm normal PR interval, APCs, nonspecific ST segment changes, criteria for LVH  Recent Labs: 06/07/2022: ALT 12; BUN 17; Creatinine, Ser 1.01; Hemoglobin 14.5; Platelets 246; Potassium 3.1; Sodium 133  Recent Lipid Panel    Component Value Date/Time   CHOL 188 12/23/2015 0421   TRIG 138 12/23/2015 0421   HDL 39 (L) 12/23/2015 0421  CHOLHDL 4.8 12/23/2015 0421   VLDL 28 12/23/2015 0421   LDLCALC 121 (H) 12/23/2015 0421    Physical Exam:    VS:  BP 130/72 (BP Location: Left Arm, Patient Position: Sitting)   Pulse 68   Ht '5\' 7"'$  (1.702 m)   Wt 181 lb (82.1 kg)   SpO2 99%   BMI 28.35 kg/m     Wt Readings from Last 3 Encounters:  06/12/22 181 lb (82.1 kg)  08/04/20 176 lb (79.8 kg)  03/17/20 195 lb (88.5 kg)     GEN:  Well nourished, well developed in no acute distress HEENT: Normal NECK: No JVD; No carotid bruits LYMPHATICS: No lymphadenopathy CARDIAC: RRR, no murmurs, no rubs, no gallops RESPIRATORY:  Clear to auscultation without rales, wheezing or rhonchi  ABDOMEN: Soft, non-tender, non-distended MUSCULOSKELETAL:  No edema; No deformity  SKIN: Warm and dry NEUROLOGIC:  Alert and oriented x 3 PSYCHIATRIC:  Normal affect   ASSESSMENT:    1. Essential hypertension   2. Mixed hyperlipidemia due to type 2 diabetes mellitus ([place])   3. Type 2 diabetes mellitus without complication, without long-term current use of insulin ([place])   4. History of lower GI bleeding    PLAN:    In order of problems listed above:  Coronary disease: He did have some symptoms which are concerning or worrisome  especially since there is some risk factors not modified.  Apparently he was not taking any cholesterol medication.  I will schedule him to have a stress test make sure he does not have any inducible ischemia.  As a part of evaluation echocardiogram to be done to reassess left ventricle ejection fraction.  I will put him back on statin and give him prescription for nitroglycerin and asked him to take it when he have pain. Dyslipidemia will start him with rosuvastatin 20 mg daily. Type 2 diabetes.  I do have his hemoglobin A1c from December 2021 which was 6.6 we will repeat that he will follow-up with primary care physician. History of lower GI bleed now   Medication Adjustments/Labs and Tests Ordered: Current medicines are reviewed at length with the patient today.  Concerns regarding medicines are outlined above.  No orders of the defined types were placed in this encounter.  No orders of the defined types were placed in this encounter.   Signed, Park Liter, MD, [facility]. 06/12/2022 11:05 AM    [place]

## 2022-06-12 NOTE — Patient Instructions (Signed)
Medication Instructions:  START: Rosuvastatin '20mg'$  1 tablet daily  START: Nitroglycerin- Use nitroglycerin 1 tablet placed under the tongue at the first sign of chest pain or an angina attack. 1 tablet may be used every 5 minutes as needed, for up to 15 minutes. Do not take more than 3 tablets in 15 minutes. If pain persist call 911 or go to the nearest ED.     Lab Work: None Ordered If you have labs (blood work) drawn today and your tests are completely normal, you will receive your results only by: [place] (if you have MyChart) OR A paper copy in the mail If you have any lab test that is abnormal or we need to change your treatment, we will call you to review the results.   Testing/Procedures: Your physician has requested that you have an echocardiogram. Echocardiography is a painless test that uses sound waves to create images of your heart. It provides your doctor with information about the size and shape of your heart and how well your heart's chambers and valves are working. This procedure takes approximately one hour. There are no restrictions for this procedure. Please do NOT wear cologne, perfume, aftershave, or lotions (deodorant is allowed). Please arrive 15 minutes prior to your appointment time.  Your physician has requested that you have a lexiscan myoview. For further information please visit [email or web address]. Please follow instruction sheet, as given.  The test will take approximately 3 to 4 hours to complete; you may bring reading material.  If someone comes with you to your appointment, they will need to remain in the main lobby due to limited space in the testing area.   How to prepare for your Myocardial Perfusion Test: Do not eat or drink 3 hours prior to your test, except you may have water. Do not consume products containing caffeine (regular or decaffeinated) 12 hours prior to your test. (ex: coffee, chocolate, sodas, tea). Do bring a list of your current  medications with you.  If not listed below, you may take your medications as normal. Do wear comfortable clothes (no dresses or overalls) and walking shoes, tennis shoes preferred (No heels or open toe shoes are allowed). Do NOT wear cologne, perfume, aftershave, or lotions (deodorant is allowed). If these instructions are not followed, your test will have to be rescheduled.      Follow-Up: At [facility], you and your health needs are our priority.  As part of our continuing mission to provide you with exceptional heart care, we have created designated Provider Care Teams.  These Care Teams include your primary Cardiologist (physician) and Advanced Practice Providers (APPs -  Physician Assistants and Nurse Practitioners) who all work together to provide you with the care you need, when you need it.  We recommend signing up for the patient portal called "MyChart".  Sign up information is provided on this After Visit Summary.  MyChart is used to connect with patients for Virtual Visits (Telemedicine).  Patients are able to view lab/test results, encounter notes, upcoming appointments, etc.  Non-urgent messages can be sent to your provider as well.   To learn more about what you can do with MyChart, go to [email or web address].    Your next appointment:   2 month(s)  The format for your next appointment:   In Person  Provider:   Jenne Campus, MD    Other Instructions NA

## 2022-06-14 ENCOUNTER — Telehealth (HOSPITAL_COMMUNITY): Payer: Self-pay | Admitting: *Deleted

## 2022-06-14 NOTE — Telephone Encounter (Signed)
Patient given detailed instructions per Myocardial Perfusion Study Information Sheet for the test on 06/21/2022 at 10:30. Patient notified to arrive 15 minutes early and that it is imperative to arrive on time for appointment to keep from having the test rescheduled.  If you need to cancel or reschedule your appointment, please call the office within 24 hours of your appointment. . Patient verbalized understanding.Antonio [name]

## 2022-06-21 ENCOUNTER — Ambulatory Visit (HOSPITAL_COMMUNITY): Payer: No Typology Code available for payment source | Attending: Cardiology

## 2022-06-21 DIAGNOSIS — R079 Chest pain, unspecified: Secondary | ICD-10-CM | POA: Insufficient documentation

## 2022-06-21 LAB — MYOCARDIAL PERFUSION IMAGING
LV dias vol: 98 mL (ref 62–150)
LV sys vol: 47 mL
Nuc Stress EF: 51 %
Peak HR: 97 {beats}/min
Rest HR: 67 {beats}/min
Rest Nuclear Isotope Dose: 10.6 mCi
SDS: 2
SRS: 0
SSS: 2
Stress Nuclear Isotope Dose: 31.9 mCi
TID: 1.23

## 2022-06-21 MED ORDER — TECHNETIUM TC 99M TETROFOSMIN IV KIT
31.9000 | PACK | Freq: Once | INTRAVENOUS | Status: AC | PRN
  Administered 2022-06-21: 31.9 via INTRAVENOUS

## 2022-06-21 MED ORDER — TECHNETIUM TC 99M TETROFOSMIN IV KIT
10.6000 | PACK | Freq: Once | INTRAVENOUS | Status: AC | PRN
  Administered 2022-06-21: 10.6 via INTRAVENOUS

## 2022-06-21 MED ORDER — REGADENOSON 0.4 MG/5ML IV SOLN
0.4000 mg | Freq: Once | INTRAVENOUS | Status: AC
  Administered 2022-06-21: 0.4 mg via INTRAVENOUS

## 2022-06-28 ENCOUNTER — Ambulatory Visit (HOSPITAL_BASED_OUTPATIENT_CLINIC_OR_DEPARTMENT_OTHER)
Admission: RE | Admit: 2022-06-28 | Discharge: 2022-06-28 | Disposition: A | Discharge status: Home / Self Care | Payer: No Typology Code available for payment source | Source: Ambulatory Visit | Attending: Cardiology | Admitting: Cardiology

## 2022-06-28 DIAGNOSIS — R0609 Other forms of dyspnea: Secondary | ICD-10-CM | POA: Insufficient documentation

## 2022-06-28 LAB — ECHOCARDIOGRAM COMPLETE
Area-P 1/2: 3.89 cm2
S' Lateral: 2.6 cm

## 2022-07-19 ENCOUNTER — Telehealth: Payer: Self-pay

## 2022-07-19 NOTE — Telephone Encounter (Signed)
-----   Message from Georgeanna Lea, MD sent at 06/28/2022  1:04 PM EDT ----- Echocardiogram showed preserved left ventricle ejection fraction, mild left ventricle hypertrophy,

## 2022-07-19 NOTE — Telephone Encounter (Signed)
Unable to reach or LM, Mailed a letter requesting a call back 

## 2022-08-13 ENCOUNTER — Ambulatory Visit: Payer: No Typology Code available for payment source | Attending: Cardiology | Admitting: Cardiology

## 2022-08-13 ENCOUNTER — Encounter: Payer: Self-pay | Admitting: Cardiology

## 2022-08-13 VITALS — BP 134/70 | HR 63 | Ht 67.0 in | Wt 178.0 lb

## 2022-08-13 DIAGNOSIS — Z8719 Personal history of other diseases of the digestive system: Secondary | ICD-10-CM

## 2022-08-13 DIAGNOSIS — E119 Type 2 diabetes mellitus without complications: Secondary | ICD-10-CM

## 2022-08-13 DIAGNOSIS — I2510 Atherosclerotic heart disease of native coronary artery without angina pectoris: Secondary | ICD-10-CM

## 2022-08-13 DIAGNOSIS — Z7984 Long term (current) use of oral hypoglycemic drugs: Secondary | ICD-10-CM

## 2022-08-13 DIAGNOSIS — I10 Essential (primary) hypertension: Secondary | ICD-10-CM

## 2022-08-13 NOTE — Progress Notes (Unsigned)
Cardiology Office Note:    Date:  08/13/2022   ID:  Kristine Linea, DOB 05/24/1960, MRN [id number]  PCP:  [facility]  Cardiologist:  Gypsy Balsam, MD    Referring MD: [facility]   Chief Complaint  Patient presents with   Results  Doing well  History of Present Illness:    Antonio [name] is a 62 y.o. male with past medical history significant for coronary artery disease.  In September 2017 he required PTCA and stenting to diagonal as well as obtuse marginal 1 branch, last intervention apparently involve right coronary artery.  He does have remote history of GI bleed, also essential hypertension, dyslipidemia, previously mildly reduced left ventricle ejection fraction which was assessed as 45% in 2019 he disappeared from follow-up since 2018 and showed up in my office in 2024.  Seems to be doing quite well.  He did have a stress test which showed no evidence of ischemia, echocardiogram showed preserved ejection fraction.  He comes today to my office to talk about this.  Overall doing well trying to be elevated more active.  Denies have any chest pain tightness squeezing pressure burning chest no palpitation dizziness.  Past Medical History:  Diagnosis Date   Coronary artery disease    Diverticulosis    GERD (gastroesophageal reflux disease)    Heart attack (HCC) 2011; 2017   Heart murmur    Hemorrhoids    High cholesterol    History of blood transfusion 2011; 2017; 05/2016   "elated to diverticulitis"   History of kidney stones    Hyperlipidemia due to type 2 diabetes mellitus (HCC)    Hypertension    Lower GI bleed 2011; 2017; 05/2016   PTSD (post-traumatic stress disorder)    PTSD   Type II diabetes mellitus (HCC)     Past Surgical History:  Procedure Laterality Date   CARDIAC CATHETERIZATION N/A 12/23/2015   Procedure: Left Heart Cath and Coronary Angiography;  Surgeon: Peter M Swaziland, MD;  Location: [facility];  Service:  Cardiovascular;  Laterality: N/A;   CARDIAC CATHETERIZATION N/A 12/23/2015   Procedure: Coronary Stent Intervention;  Surgeon: Peter M Swaziland, MD;  Location: [facility] INVASIVE CV LAB;  Service: Cardiovascular;  Laterality: N/A;   COLON RESECTION  2011   12 inches taken out in 2011 at [facility] for diverticulosis   COLON SURGERY     COLONOSCOPY N/A 10/18/2012   Procedure: COLONOSCOPY;  Surgeon: Louis Meckel, MD;  Location: [facility] ENDOSCOPY;  Service: Endoscopy;  Laterality: N/A;   COLONOSCOPY N/A 10/22/2012   Procedure: COLONOSCOPY;  Surgeon: Iva Boop, MD;  Location: [facility] ENDOSCOPY;  Service: Endoscopy;  Laterality: N/A;   CORONARY ANGIOPLASTY WITH STENT PLACEMENT  2011   at [facility], 2 stents, locations unknown   CYSTOSCOPY/URETEROSCOPY/HOLMIUM LASER/STENT PLACEMENT Right 03/23/2019   Procedure: CYSTOSCOPY/URETEROSCOPY/HOLMIUM LASER/STENT PLACEMENT;  Surgeon: Heloise Purpura, MD;  Location: [facility];  Service: Urology;  Laterality: Right;   ESOPHAGOGASTRODUODENOSCOPY N/A 10/18/2012   Procedure: ESOPHAGOGASTRODUODENOSCOPY (EGD);  Surgeon: Louis Meckel, MD;  Location: [facility] ENDOSCOPY;  Service: Endoscopy;  Laterality: N/A;   INGUINAL HERNIA REPAIR Right    IRRIGATION AND DEBRIDEMENT ABSCESS N/A 10/26/2012   Procedure: IRRIGATION AND DEBRIDEMENT PERINEAL ABSCESS;  Surgeon: Wilmon Arms. Corliss Skains, MD;  Location: [facility];  Service: General;  Laterality: N/A;   JOINT REPLACEMENT      Current Medications: Current Meds  Medication Sig   amLODipine (NORVASC) 10 MG tablet Take  10 mg by mouth daily.   cephALEXin (KEFLEX) 500 MG capsule Take 1 capsule (500 mg total) by mouth 4 (four) times daily.   Cholecalciferol (VITAMIN D) 50 MCG (2000 UT) CAPS Take 2,000 Units by mouth daily.   metFORMIN (GLUCOPHAGE-XR) 500 MG 24 hr tablet Take 1,000 mg by mouth daily.   metoprolol (TOPROL-XL) 200 MG 24 hr tablet Take 200 mg by mouth daily.   nitroGLYCERIN (NITROSTAT) 0.4 MG SL tablet  Place 1 tablet (0.4 mg total) under the tongue every 5 (five) minutes as needed for chest pain.   pantoprazole (PROTONIX) 40 MG tablet Take 40 mg by mouth daily. For heartburn   rosuvastatin (CRESTOR) 20 MG tablet Take 1 tablet (20 mg total) by mouth daily.   Semaglutide,0.25 or 0.5MG /DOS, (OZEMPIC, 0.25 OR 0.5 MG/DOSE,) 2 MG/1.5ML SOPN Inject 0.5 mg into the skin every Monday.   vitamin B-12 (CYANOCOBALAMIN) 1000 MCG tablet Take 1,000 mcg by mouth daily.     Allergies:   Lisinopril   Social History   Socioeconomic History   Marital status: Married    Spouse name: Not on file   Number of children: Not on file   Years of education: Not on file   Highest education level: Not on file  Occupational History   Occupation: Retired Electronics engineer  Tobacco Use   Smoking status: Former    Packs/day: 0.50    Years: 20.00    Additional pack years: 0.00    Total pack years: 10.00    Types: Cigarettes    Quit date: 10/19/2002    Years since quitting: 19.8   Smokeless tobacco: Never  Vaping Use   Vaping Use: Never used  Substance and Sexual Activity   Alcohol use: Yes    Comment: occ   Drug use: Yes    Types: Marijuana   Sexual activity: Not on file  Other Topics Concern   Not on file  Social History Narrative   Lives with wife in [place].   Social Determinants of Health   Financial Resource Strain: Not on file  Food Insecurity: Not on file  Transportation Needs: Not on file  Physical Activity: Not on file  Stress: Not on file  Social Connections: Not on file     Family History: The patient's family history includes Alzheimer's disease (age of onset: 40) in his mother; CAD (age of onset: 47) in his brother; Heart attack (age of onset: 49) in his father. ROS:   Please see the history of present illness.    All 14 point review of systems negative except as described per history of present illness  EKGs/Labs/Other Studies Reviewed:      Recent Labs: 06/07/2022: ALT 12; BUN 17;  Creatinine, Ser 1.01; Hemoglobin 14.5; Platelets 246; Potassium 3.1; Sodium 133  Recent Lipid Panel    Component Value Date/Time   CHOL 188 12/23/2015 0421   TRIG 138 12/23/2015 0421   HDL 39 (L) 12/23/2015 0421   CHOLHDL 4.8 12/23/2015 0421   VLDL 28 12/23/2015 0421   LDLCALC 121 (H) 12/23/2015 0421    Physical Exam:    VS:  BP 134/70 (BP Location: Left Arm, Patient Position: Sitting)   Pulse 63   Ht 5\' 7"  (1.702 m)   Wt 178 lb (80.7 kg)   SpO2 99%   BMI 27.88 kg/m     Wt Readings from Last 3 Encounters:  08/13/22 178 lb (80.7 kg)  06/12/22 181 lb (82.1 kg)  08/04/20 176 lb (79.8 kg)  GEN:  Well nourished, well developed in no acute distress HEENT: Normal NECK: No JVD; No carotid bruits LYMPHATICS: No lymphadenopathy CARDIAC: RRR, no murmurs, no rubs, no gallops RESPIRATORY:  Clear to auscultation without rales, wheezing or rhonchi  ABDOMEN: Soft, non-tender, non-distended MUSCULOSKELETAL:  No edema; No deformity  SKIN: Warm and dry LOWER EXTREMITIES: no swelling NEUROLOGIC:  Alert and oriented x 3 PSYCHIATRIC:  Normal affect   ASSESSMENT:    1. Coronary artery disease involving native coronary artery of native heart without angina pectoris PTCA and stenting of RCA obtuse marginal branch done in 2017   2. Essential hypertension   3. Type 2 diabetes mellitus without complication, without long-term current use of insulin (HCC)   4. History of lower GI bleeding    PLAN:    In order of problems listed above:  Coronary disease history of stenting before.  Last stress test negative.  Will continue present management which include antiplatelets therapy and risk factors modification including dyslipidemia management. Essential hypertension blood pressure well-controlled continue present management. Type 2 diabetes that being followed by internal medicine team. Dyslipidemia I did put him on Crestor back last time Crestor 20.  Will check his fasting lipid  profile.   Medication Adjustments/Labs and Tests Ordered: Current medicines are reviewed at length with the patient today.  Concerns regarding medicines are outlined above.  No orders of the defined types were placed in this encounter.  Medication changes: No orders of the defined types were placed in this encounter.   Signed, Georgeanna Lea, MD, [facility] 08/13/2022 10:44 AM    [facility]

## 2022-08-13 NOTE — Patient Instructions (Signed)
Medication Instructions:  Your physician recommends that you continue on your current medications as directed. Please refer to the Current Medication list given to you today.  *If you need a refill on your cardiac medications before your next appointment, please call your pharmacy*   Lab Work: Your physician recommends that you return for lab work in:   Labs today in [address]: Lipids  If you have labs (blood work) drawn today and your tests are completely normal, you will receive your results only by: MyChart Message (if you have MyChart) OR A paper copy in the mail If you have any lab test that is abnormal or we need to change your treatment, we will call you to review the results.   Testing/Procedures: None   Follow-Up: At [facility], you and your health needs are our priority.  As part of our continuing mission to provide you with exceptional heart care, we have created designated Provider Care Teams.  These Care Teams include your primary Cardiologist (physician) and Advanced Practice Providers (APPs -  Physician Assistants and Nurse Practitioners) who all work together to provide you with the care you need, when you need it.  We recommend signing up for the patient portal called "MyChart".  Sign up information is provided on this After Visit Summary.  MyChart is used to connect with patients for Virtual Visits (Telemedicine).  Patients are able to view lab/test results, encounter notes, upcoming appointments, etc.  Non-urgent messages can be sent to your provider as well.   To learn more about what you can do with MyChart, go to [email or web address].    Your next appointment:   6 month(s)  Provider:   Gypsy Balsam, MD    Other Instructions Patient decline EKG chaperone

## 2022-08-14 LAB — LIPID PANEL
Chol/HDL Ratio: 1.9 ratio (ref 0.0–5.0)
Cholesterol, Total: 112 mg/dL (ref 100–199)
HDL: 60 mg/dL (ref >39)
LDL Chol Calc (NIH): 37 mg/dL (ref 0–99)
Triglycerides: 76 mg/dL (ref 0–149)
VLDL Cholesterol Cal: 15 mg/dL (ref 5–40)

## 2022-12-01 DIAGNOSIS — U071 COVID-19: Secondary | ICD-10-CM | POA: Diagnosis not present

## 2023-10-28 ENCOUNTER — Telehealth: Payer: Self-pay

## 2023-10-28 NOTE — Telephone Encounter (Signed)
 Patient was identified as falling into the [place] Measure - Diabetes.   Patient was: Attribution and/or data issue.  Validation/Investigation needed.  Explanation:  Patient never seen at [facility]. Patient at [facility].
